# Patient Record
Sex: Male | Born: 1960 | Race: Black or African American | Hispanic: No | Marital: Single | Smoking: Former smoker
Health system: Southern US, Community
[De-identification: ages and names within clinical notes are randomized; demographics above are authoritative.]

## PROBLEM LIST (undated history)

## (undated) DIAGNOSIS — C3490 Malignant neoplasm of unspecified part of unspecified bronchus or lung: Secondary | ICD-10-CM

---

## 1997-10-29 ENCOUNTER — Encounter: Payer: Self-pay | Admitting: Emergency Medicine

## 1997-10-29 ENCOUNTER — Emergency Department (HOSPITAL_COMMUNITY): Admission: EM | Admit: 1997-10-29 | Discharge: 1997-10-29 | Payer: Self-pay | Admitting: Emergency Medicine

## 1997-11-28 ENCOUNTER — Emergency Department (HOSPITAL_COMMUNITY): Admission: EM | Admit: 1997-11-28 | Discharge: 1997-11-28 | Payer: Self-pay

## 2000-10-31 ENCOUNTER — Emergency Department (HOSPITAL_COMMUNITY): Admission: EM | Admit: 2000-10-31 | Discharge: 2000-10-31 | Payer: Self-pay | Admitting: Emergency Medicine

## 2004-11-16 ENCOUNTER — Emergency Department (HOSPITAL_COMMUNITY): Admission: EM | Admit: 2004-11-16 | Discharge: 2004-11-17 | Payer: Self-pay | Admitting: Emergency Medicine

## 2007-06-16 ENCOUNTER — Inpatient Hospital Stay (HOSPITAL_COMMUNITY): Admission: EM | Admit: 2007-06-16 | Discharge: 2007-06-20 | Payer: Self-pay | Admitting: Emergency Medicine

## 2007-06-19 ENCOUNTER — Ambulatory Visit: Payer: Self-pay | Admitting: Psychiatry

## 2007-06-20 ENCOUNTER — Inpatient Hospital Stay (HOSPITAL_COMMUNITY): Admission: AD | Admit: 2007-06-20 | Discharge: 2007-06-25 | Payer: Self-pay | Admitting: *Deleted

## 2007-06-20 ENCOUNTER — Ambulatory Visit: Payer: Self-pay | Admitting: *Deleted

## 2007-07-12 ENCOUNTER — Encounter: Admission: RE | Admit: 2007-07-12 | Discharge: 2007-07-12 | Payer: Self-pay | Admitting: Neurosurgery

## 2010-07-27 NOTE — Consult Note (Signed)
NAMEGRECO, GASTELUM NO.:  [id number]   MEDICAL RECORD NO.:  [id number]          PATIENT TYPE:  INP   LOCATION:  5122                         FACILITY:  [facility]   PHYSICIAN:  Anselm Jungling, MD  DATE OF BIRTH:  12-Sep-1960   DATE OF CONSULTATION:  06/19/2007  DATE OF DISCHARGE:                                 CONSULTATION   IDENTIFYING DATA AND REASON FOR REFERRAL:  The patient is a 50 year old  separated African American male, currently under the care of Dr. Leanor Rubenstein  here at [facility].  He was hospitalized for medical treatment  of injuries sustained in a hanging attempt, which was also a suicide  attempt.  Psychiatric consultation is requested to assess mental status  and make recommendations.   HISTORY OF THE PRESENTING PROBLEMS:  The patient, who appears to be a  fairly good historian, gives the following information.  He states that  he has no prior psychiatric history and no history of depression.  However, several months ago, due to changes in the economy, his work  hours were cut back.  The financial stresses led to loss of his home,  and at the same time he was going through marital problems and he and  his wife separated.  He was living in a motel.  He began drinking more  heavily.  He made a suicide attempt while intoxicated.  He admits that  he has a drinking problem.   At this time, the patient indicates that he is glad that he survived his  suicide attempt and is ready to go on living.  He indicates that he  agrees that he needs help and treatment for his depression and his  alcohol problem.   MENTAL STATUS AND OBSERVATIONS:  The patient is a well-nourished,  normally-developed adult male in his hospital bed, with a neck brace.  He is able to speak.  He is alert and fully oriented.  Thoughts and  speech are normally organized.  There is nothing to suggest any  underlying psychosis or thought disorder.  Mood is depressed with a very  sad affect.  He denies any active suicidal ideation as described above.   IMPRESSION:  AXIS I:  1.  Major depressive episode.  2.  Alcohol  dependence.  AXIS II:  Deferred.  AXIS III:  Status post hanging attempt, associated injuries.  AXIS IV:  Stressors severe.  AXIS V:  GAF 45.   RECOMMENDATIONS:  The patient agrees to transfer to inpatient psychiatry  to further address his depression and alcohol dependence.  Dr. Leanor Rubenstein  has indicated that the patient is probably medically cleared to go today  or tomorrow.  I will contact the assessment office and social work here  to bring about that transition.      Anselm Jungling, MD  Electronically Signed     SPB/MEDQ  D:  06/19/2007  T:  06/19/2007  Job:  [phone]

## 2010-07-27 NOTE — Discharge Summary (Signed)
NAMEFRANCESCO, [name] Chapman                  ACCOUNT NO.:  [id number]   MEDICAL RECORD NO.:  [id number]          PATIENT TYPE:  INP   LOCATION:  5122                         FACILITY:  [facility]   PHYSICIAN:  Gabrielle Dare. Janee Morn, M.D.DATE OF BIRTH:  1960-08-14   DATE OF ADMISSION:  06/16/2007  DATE OF DISCHARGE:  06/19/2007                               DISCHARGE SUMMARY   ADMITTING TRAUMA SURGEON:  Sandria Bales. Ezzard Standing, M.D.   CONSULTANTS:  1. Lyndal Pulley Chales Salmon, M.D., maxillofacial.  2. Donalee Citrin, M.D., neurosurgery.  3. Anselm Jungling, MD, psychiatry.   DISCHARGE DIAGNOSES:  1. Status post suicide attempt by hanging.  2. Right mandibular midbody nondisplaced fracture.  3. Comminuted left mandibular ramus fracture.  4. Displaced left mandibular subcondylar fracture.  5. Cervical neck strain secondary to attempted suicide by hanging.  6. History of alcohol abuse and tobacco abuse.   PROCEDURES:  None.   This is a 50 year old black male who reportedly attempted to hang  himself with a sheet from the second story of the building.  Apparently  the sheet gave way, and the patient fell.  He was brought into [facility] ED as a silver trauma code activation.  He was identified to have a  fractured mandible. He was alert, oriented and appropriate on his  admission.  Workup at this time including a CT scan of his head was  negative.  CT scan of the face showed a right mandible body fracture,  left ramus fracture which was comminuted, and left subcondylar mandible  fracture.  CT scan of the C-spine showed no acute neck injury, although  there was question of cervical ligamentous instability.  CT scan of his  chest was negative.  CT scan of abdomen and pelvis showed no evidence  for intra-abdominal injury.   HOSPITAL COURSE:  The patient was admitted.  He was left in his cervical  collar for questionable C-spine injury.  He was evaluated by Dr. Chales Salmon  for his mandible fracture. Given the patients  multiple missing teeth, no  open fractures, and the relatively minimal displacement of his  fractures, it was felt that no surgical intervention was recommended at  this time.  It was felt that the fractures with take approximately 4-6  weeks to heal and that a strict non-chew diet was going to be necessary  to low this to occur.  He will need to follow up with Dr. Chales Salmon in  approximately 4 weeks in his office to reevaluate his fractures.   To further evaluate the patient's C-spine ligamentous injury, the  patient did undergo MRI scanning of cervical spine to further evaluate  his spine for ligamentous injury.  No clear-cut ligamentous injury was  seen; however, it was felt that he did have some prevertebral and  retropharyngeal edema which was related to his hanging episode.  As he  does have some discomfort, he will be maintained in a cervical collar  and can follow up with Dr. Wynetta Emery for this after discharge.   The patient was also being evaluated by Dr. Electa Sniff for his suicide  attempt and depression.  It was felt that he is undergoing a major  depressive episode and has significant alcohol dependence.  The patient  was agreeable to transfer to inpatient psychiatry to further address and  treat his alcohol dependence and depression.  It is felt that he is  medically clear at this time for this to occur.   DISCHARGE MEDICATIONS:  1. Keflex 500 mg elixir 4 times a day x10 more days.  2. He is on the Librium EtOH withdrawal protocol, currently at 25 mg      q.12 h today, then at 25 mg q.24 h tomorrow.  3. He is on Peridex mouth wash 10 mL p.o. 4 times a day x10 more days.  4. Multivitamin one daily.  5. Thiamine 100 mg p.o. daily.  6. Lortab elixir 5-10 mL p.o. q.4 h p.r.n. pain,   ACTIVITY:  To tolerance.   DIET:  Soft, non-chew foods only secondary to his mandible fracture.   FOLLOWUP:  With Dr. Wynetta Emery and Dr. Chales Salmon in approximately 1 month.   Again, at this time it is felt  that the patient should discharged to  inpatient psychiatry, and arrangements are being made for this to done.      Shawn Rayburn, P.A.      Gabrielle Dare Janee Morn, M.D.  Electronically Signed    SR/MEDQ  D:  06/19/2007  T:  06/19/2007  Job:  [id number]   cc:   Lyndal Pulley Chales Salmon, M.D.  Donalee Citrin, M.D.  Anselm Jungling, MD  [facility]

## 2010-07-27 NOTE — H&P (Signed)
NAMEQUIRINO, KAKOS                  ACCOUNT NO.:  [id number]   MEDICAL RECORD NO.:  [id number]          PATIENT TYPE:  EMS   LOCATION:  MAJO                         FACILITY:  [facility]   PHYSICIAN:  Sandria Bales. Ezzard Standing, M.D.  DATE OF BIRTH:  Aug 28, 1960   DATE OF ADMISSION:  06/16/2007  DATE OF DISCHARGE:                              HISTORY & PHYSICAL   Date of history here ??   HISTORY OF PRESENT ILLNESS:  This is a 50 year old black gentleman who  has no identifying medical doctor, and he has a strong history of  alcohol abuse.  Apparently he tried to hang himself tonight with a sheet  from a second floor, the sheet gave way, and the patient fell.  He came  into the [facility] ER stable as a silver trauma.  He has been identified to  have a fractured mandible, but no other significant injuries.   He admits to drinking alcohol and having alcoholic problems.  His  separated wife is in the room and also speaks of his alcohol abuse and  his cigarettes smoking abuse.   He responds, he is appropriate, he smells of alcohol but can give a  suprisingly lucid history of what happened and his problems.   PAST MEDICAL HISTORY:   ALLERGIES:  None.   MEDICATIONS:  None.   REVIEW OF SYMPTOMS:  NEUROLOGICAL:  No seizures or loss of  consciousness.  PULMONARY:  Smokes cigarettes, knows it is bad for his health.  No  history of chronic lung disease.  CARDIAC:  Denies heart disease or chest pain.  GASTROINTESTINAL:  He has been told he has gastroesophageal reflux  disease, took a pill one time, he is better now.  He has no history of  liver disease, pancreatic disease, colon disease.  UROLOGIC:  No history of kidney stones or kidney infections.   Again, his estranged wife is here with him.  I am not sure if they live  together or not.  He works in [employer], [employer] for [employer].  He has no primary medical doctor.   PHYSICAL EXAMINATION:  VITAL SIGNS:  Pulse 93,  respirations 16, blood  pressure 155/94.  GENERAL:  He is an older black gentleman, alert, cooperative, again,  smells heavily of alcohol, but amazingly mentally alert for the blood  alcohol level that he has.  HEENT:  Pupils equal and reactive to light with extraocular movements  good x 6.  Pupils react.  He is absent most of his teeth.  He has an  abrasion laceration at the edge of his right mandible.  He is in a  cervical collar.  His TMs are unremarkable.  Again, he is missing a lot  of teeth, whether these are acute injury, but he has a lot of chronic  disease of his teeth already.  LUNGS:  Clear to auscultation.  HEART:  Regular rate and rhythm without murmur or rub.  ABDOMEN:  Soft and scaphoid without scars.  EXTREMITIES:  No obvious long bone injury.  He moves his upper lower  extremities.  NEUROLOGICAL:  Grossly intact to motor and sensory function.  When I  rolled him on his back, he has no pain in the neck from the cervical  collar just because of his alcohol level.   LABORATORY DATA:  Sodium 137, potassium 3.9, chloride 100, BUN 5,  creatinine 0.9.  hemoglobin 17, hematocrit 50.  Blood alcohol 365.   I reviewed his CT scans with Dr. Augusto Gamble.  CT of his head was negative.  CT of his face shows a right mandible body fracture which is comminuted.  A right ramus fracture and a left ramus fracture which is impacted.  CT of his neck shows no acute injury, though, Dr. Margo Aye did raise a  question if just anterior cervix could be a little bit thicker.  I am  leaving the collar overnight to see if he has pain in the morning.  CT of his chest was negative.  CT of his abdomen showed no obvious acute intraabdominal injury.   I have made his C-spine not clear because of alcohol level and I left  him in the collar overnight.   IMPRESSION:  1. Suicide attempt.  Will get Psych Act Team involved.  2. Fractured mandible times three, to be seen by Dr. Chales Salmon who is on      call for  maxillofacial.  3. Alcohol abuse which probably will not get resolved in this      admission.  Will cover with thiamine and vitamins and start      withdrawal protocol if necessary.  4. Smokes cigarettes.  5. Gastroesophageal reflux disease.      Sandria Bales. Ezzard Standing, M.D.  Electronically Signed     DHN/MEDQ  D:  06/16/2007  T:  06/17/2007  Job:  [id number]   cc:   Lyndal Pulley Chales Salmon, M.D.

## 2010-07-27 NOTE — H&P (Signed)
[name] Chapman, IRION                  ACCOUNT NO.:  [id number]   MEDICAL RECORD NO.:  [id number]          PATIENT TYPE:  INP   LOCATION:  5122                         FACILITY:  [facility]   PHYSICIAN:  Lyndal Pulley. Chales Salmon, M.D.   DATE OF BIRTH:  [birth year]   DATE OF ADMISSION:  06/16/2007  DATE OF DISCHARGE:                              HISTORY & PHYSICAL   Asked to evaluate this 50 year old black male by the trauma service who  reportedly attempted suicide earlier this evening.  The patient  attempted to hang himself with a sheet, the sheet gave way and the  patient fell on his face, reportedly sustaining mandibular fractures.  The emergency department physician and the trauma surgeon cleared him of  other injuries, other than a pending C-spine study.   EXAM:  The patient had obvious left facial swelling with his mandible  deviated slightly to the left.  He had limited mandibular opening;  however, was guarding secondary to pain.  He had no obvious step  deformities along the inferior border of his mandible and his mandible  was stable.  His mid facial exam was negative.   INTRAORAL EXAM:  The patient had multiple missing teeth with his  remaining teeth all mobile secondary to severe periodontal disease.  There was moderate floor-of-mouth swelling and ecchymosis, primarily on  the right side.  His posterior pharynx was clear as well as his airway.  There were no obvious open fractures present intraorally with his  overlying mucosal tissue intact.  His occlusion was impossible to  determine secondary to the multiple missing teeth.  His TMs were clear  bilaterally and there was no Battle's sign.   RADIOGRAPHIC EXAM:  Maxillofacial CT scan revealed a right mandibular  mid body nondisplaced fracture.  It also demonstrated a comminuted left  mandibular ramus fracture as well as a displaced left mandibular  subcondylar fracture.  No mid facial or maxillary fractures were seen.   ASSESSMENT AND  PLAN:  In light of the patient's multiple missing teeth  and no open fractures, no surgical intervention is recommended.  The  right mandibular body fracture was also nondisplaced and stable.   The findings and recommended treatment were discussed both with the  patient and his wife, who was present.  They were told that the  fractures would require approximately 4-6 weeks of healing and that a  strict non-chew diet was necessary to allow the fractures to heal.  He  was placed on Ancef 1 gram IV q.8h. as an inpatient and will be given  Keflex 500 mg to be taken four times a day for 10 full days after he is  discharged.  The trauma service that he is admitted to will take care of  other medications and follow-up.  He and his wife were also told that a  follow-up appointment will be made at my office in approximately 4  weeks.      Lyndal Pulley Chales Salmon, M.D.  Electronically Signed     TGO/MEDQ  D:  06/17/2007  T:  06/17/2007  Job:  [id number]

## 2010-07-27 NOTE — H&P (Signed)
NAMEMARLIN, [name] Chapman NO.:  [id number]   MEDICAL RECORD NO.:  [id number]          PATIENT TYPE:  IPS   LOCATION:  0303                          FACILITY:  [facility]   PHYSICIAN:  Jasmine Pang, M.D. DATE OF BIRTH:  1961/01/25   DATE OF ADMISSION:  06/20/2007  DATE OF DISCHARGE:                       PSYCHIATRIC ADMISSION ASSESSMENT   HISTORY OF PRESENT ILLNESS:  Voluntarily admitted on June 20, 2007.  The  patient presents with a history of a suicide attempt.  States the  patient had tried to hang himself with the bed sheets from a second  story building.  The patient states that he did it as a joke.  He was  in a motel at that time and his wife was present.  He states the next  thing he knew that he was lying on cement and then taken to the  emergency department.  The patient reports using alcohol pretty much on  a daily basis, experiencing stress, feeling low, being by himself.  He  lost his home, having problems with his work and is currently separated  from his wife.  He states that his family does not like his wife.  He  drinks when he has money and has been doing some occasional cocaine use.   PAST PSYCHIATRIC HISTORY:  First admission to [facility].  He was in a rehab program at [facility] about 5-6 years ago.  No current  outpatient mental health treatment.   SOCIAL HISTORY:  He is a married male, second marriage, married for 20  years, has 2 children, 7 grandchildren and works in the moving business.  Denies any legal problems.   FAMILY HISTORY:  Is none.   ALCOHOL AND DRUG HISTORY:  The patient smokes cigarettes, has been  drinking daily without any seizure activities, did have withdrawal  symptoms when he has tried to stop drinking on his own and uses rock  cocaine.   PRIMARY CARE Atleigh Gruen:  Is none.   MEDICAL PROBLEMS:  The patient currently has a right mandibular  nondisplaced fracture, has a displaced left mandibular fracture and a  cervical neck strain.   MEDICATIONS:  1. The patient was discharged on Keflex 500 mg elixir 4 times a day      for 10 days.  2. Librium 25 mg one today to finish up his alcohol protocol.  3. Peridex mouth wash 10 mL q.i.d. for 10 more days.  4. Multivitamin daily.  5. Thiamine 100 mg daily.  6. Lortab elixir 5-10 mL every 4 hours as needed.   DRUG ALLERGIES:  No known allergies.   PHYSICAL EXAM:  GENERAL:  This is a middle-aged male again transferred  from [facility] after a 3 day period of being assessed for  injuries and stabilization.  He appears in no acute distress.  He is  currently wearing a cervical collar.  VITAL SIGNS:  His vital signs 97.5, 96 heart rate, 18 respirations.  His  blood pressure is 137/90, 152 pounds, 5 feet 9 inches tall.  He does not  have any tremors noted.  LABORATORY DATA:  Hemoglobin is 12.2, hematocrit 35.5.  Urine drug  screen is negative.  Alcohol level on admission to the emergency room  was 365, glucose of 101, BUN is 5.   MENTAL STATUS EXAM:  This is a fully alert, cooperative male.  He has  good eye contact.  He is currently wearing a hospital gown.  He has a C  collar in place.  Speech is soft-spoken, normal pace.  The patient today  feels stupid for what he did.  He appears sad.  He is very agreeable  to treatment plan.  Provides a good story and history.  Thought  processes are coherent.  No evidence of any psychotic symptoms.  Denies  any suicidal thoughts.  Cognitive function intact.  His memory is good.  Judgment and insight is fair.  Poor impulse control.   AXIS I:  Major depressive disorder, severe, alcohol dependence.  AXIS II:  Deferred.  AXIS III:  Right mandibular nondisplaced fracture and a displaced left  mandibular fracture with cervical neck strain.  AXIS IV:  Severe with primary support group, occupation, housing,  economic issues.  Medical problems and psychosocial problems.  AXIS V:  Current is 35.   PLAN:   Contract for safety.  Stabilize mood and thinking.  Will continue  with Librium protocol, monitor withdrawal symptoms.  The patient will  maintain a cervical collar as advised.  Will offer fluids.  Case manager  is to assess his followup plan and will have a family session with his  wife.  The patient knows he needs to abstain from alcohol and to get his  life back together.  He also may benefit from some therapy and an  antidepressant.  We will continue to assess co-morbidities.  The patient  is to follow up with Dr. Chales Salmon and Dr. Purnell Shoemaker in 4 weeks.  His tentative  length of stay is 4-6 days.      Landry Corporal, N.P.      Jasmine Pang, M.D.  Electronically Signed    JO/MEDQ  D:  06/21/2007  T:  06/21/2007  Job:  [id number]   cc:   Lianne Bushy, M.D.  Fax: [phone]

## 2010-07-30 NOTE — Discharge Summary (Signed)
[name], [name] NO.:  [id number]   MEDICAL RECORD NO.:  [id number]          PATIENT TYPE:  IPS   LOCATION:  0303                          FACILITY:  [facility]   PHYSICIAN:  Jasmine Pang, M.D. DATE OF BIRTH:  Sep 16, 1960   DATE OF ADMISSION:  06/20/2007  DATE OF DISCHARGE:  06/25/2007                               DISCHARGE SUMMARY   IDENTIFICATION:  This is a involuntary admission for this 50 year old  married male who was admitted on June 20, 2007.   HISTORY OF PRESENT ILLNESS:  The patient presents with a history of  suicide attempt.  The patient apparently tried to hang himself with bed  sheet from a second storey building.  He states he did this as a joke.  He was in a motel at time and his wife was present.  He states the next  thing he knew he was lying on cement and taken to the emergency  department.  The patient reports using alcohol pretty much on a daily  basis experiencing stress feeling low being by himself.  He lost his  home having problems with his work and is currently separated from his  wife.  He states his family does not like his wife.  He drinks when he  has money and has been doing occasional cocaine.   PAST PSYCHIATRIC HISTORY:  This is the first admission to [facility].  The patient was in a rehab program at [facility], 5-6  years ago.  He has no current outpatient treatment.   FAMILY HISTORY:  None.   ALCOHOL AND DRUG HISTORY:  The patient smokes cigarettes.  He has been  drinking daily without any seizure activities.  He did have withdrawal  symptoms when he tried to stop drinking on his own.  He also uses rock  cocaine.   MEDICAL PROBLEMS:  The patient currently has a right mandibular  nondisplaced fracture.  He has a displaced left mandibular fracture and  a cervical neck strain.   MEDICATIONS:  The patient was discharged on Keflex 400 mg four times a  day for 10 days and Librium 25 mg one today to finish his  detox  protocol.  He was also prescribed Peridex mouth wash, multivitamins,  thiamine 100 mg, and Lortab elixir 5-10 mL every 4 hours as needed for  pain in his jaw.   DRUG ALLERGIES:  No known drug allergies.   PHYSICAL FINDINGS:  This is a middle-aged man who was transferred from  [facility] after a 3-day period of being assessed for injuries  and stabilizations.  He was in no acute distress.  See the physical exam  done on this unit as well documented in the chart.   LABORATORY DATA:  Hemoglobin was 12.2, hematocrit 35.5.  Urine drug  screen was negative.  Alcohol level on admission to the emergency room  was 365.  Glucose was 101.  BUN was 5.   HOSPITAL COURSE:  Upon admission, the patient was started on Ambien 10  mg p.o. q.h.s. p.r.n. insomnia and Motrin 400 mg  p.o. q.4 h. p.r.n.  pain.  He was also continued on Keflex elixir 500 mg q.i.d. for 10 days  and Librium and Peridex mouth wash 10 cc q.i.d. for 10 more days.  He  was started on Lortab elixir 5-10 cc q.4 h. p.r.n. pain.  On June 21, 2007, he was started on Librium 25 mg p.o. q.6 h. p.r.n. symptoms of  withdrawal or anxiety for 48 hours.  On June 23, 2007, he was also  started on Celexa 10 mg in the morning and at h.s. for depression and  anxiety.  In individual sessions with me, the patient was reserved, but  cooperative.  He had to wear a neck brace, which interfered with his  range of motion of his head.  He was in some pain from the jaw fracture.  He was able to participate appropriately in unit therapeutic groups and  activities.  He stated he was here due to alcohol and depression my  wife is given me on hard time.  He lost his house and finances are a  problem.  This past weekend he began to drink, he used all his money,  tried to hang himself.  He states this was done as a joke, but he  accidentally passed out.  He reports drinking a case of beer at night.  He denies any other drug abuse.  As  hospitalization progressed, mental  status improved.  His wife visited and he stated this went well.  He was  on intervals, was starting on the Celexa as indicated above.  On June 25, 2007, the patient had continued to make very good progress.  His  sleep was good.  Appetite was good.  Mood was less depressed, less  anxious.  Affect consistent with mood.  There was no suicidal or  homicidal ideation.  No auditory or visual hallucinations.  No paranoia  or delusions.  Thoughts were logical and goal-directed.  Thought  content, no predominant theme.  Cognitive was grossly back to baseline.  The patient wanted discharge and planned to go live with his sister.  There was going to be a family session with his wife and he was then be  discharged.  His wife did not feel he was a risk to harm himself and the  patient denied suicidal ideation.  He stated he was going to [employer]  again and his wife agreed they will only talk once a week for now, so  that he can focus on himself.  He will go to live with his sister.  It  was felt the patient was safe for discharge.   DISCHARGED DIAGNOSES:  Axis I:  Depressive disorder, not otherwise  specified, alcohol dependence.  Axis II:  None.  Axis III:  Right mandibular nondisplaced fracture and a displaced left  mandibular fracture with cervical neck strain.  Axis IV:  Severe (problems with primary support group, occupation,  housing, economic issues, medical problems and psychosocial problems).  Axis V:  Global assessment of functioning was 50 upon discharge.  GAF  was 35 upon admission.  GAF highest past year was 60-65.   DISCHARGE PLANS:  There was no specific activity level or dietary  restrictions.   POST HOSPITAL CARE PLANS:  The patient will see Dr. Joni Reining at the  [facility] on July 02, 2007, at 10:30 a.m.  He is to follow up  with his oral surgeon, Dr. Remonia Richter in 4 weeks and follow up with his  orthopedist, Dr. Cheree Ditto, on Thursday  at April 30th at 11:30 a.m.   DISCHARGE MEDICATIONS:  1. Celexa 10 mg in the morning and at bedtime.  2. Keflex 500 mg q.i.d. to take until June 30, 2007.  3. Peridex 10 cc, patient is to use as directed.      Jasmine Pang, M.D.  Electronically Signed     B[facility]/MEDQ  D:  07/26/2007  T:  07/26/2007  Job:  [id number]

## 2010-12-07 LAB — POCT I-STAT, CHEM 8
BUN: 5 — ABNORMAL LOW
Calcium, Ion: 1.03 — ABNORMAL LOW
Creatinine, Ser: 0.9
Hemoglobin: 17
Sodium: 137
TCO2: 23

## 2010-12-07 LAB — POCT CARDIAC MARKERS
CKMB, poc: 1.3
Myoglobin, poc: 89.1
Troponin i, poc: <0.05

## 2010-12-07 LAB — RAPID URINE DRUG SCREEN, HOSP PERFORMED
Amphetamines: NOT DETECTED
Barbiturates: NOT DETECTED
Opiates: NOT DETECTED
Tetrahydrocannabinol: NOT DETECTED

## 2010-12-07 LAB — ETHANOL: Alcohol, Ethyl (B): 365 — ABNORMAL HIGH

## 2010-12-07 LAB — CBC
MCHC: 34.3
Platelets: 280
RDW: 13.1

## 2011-01-17 ENCOUNTER — Emergency Department (HOSPITAL_COMMUNITY)
Admission: EM | Admit: 2011-01-17 | Discharge: 2011-01-17 | Discharge status: Absent without leave | Payer: Self-pay | Source: Home / Self Care

## 2011-01-18 ENCOUNTER — Emergency Department (HOSPITAL_COMMUNITY)
Admission: EM | Admit: 2011-01-18 | Discharge: 2011-01-18 | Disposition: A | Discharge status: Home / Self Care | Payer: Self-pay | Source: Home / Self Care | Attending: Family Medicine | Admitting: Family Medicine

## 2011-01-18 ENCOUNTER — Encounter: Payer: Self-pay | Admitting: *Deleted

## 2011-01-18 DIAGNOSIS — Z9189 Other specified personal risk factors, not elsewhere classified: Secondary | ICD-10-CM

## 2011-01-18 DIAGNOSIS — Z202 Contact with and (suspected) exposure to infections with a predominantly sexual mode of transmission: Secondary | ICD-10-CM

## 2011-01-18 NOTE — ED Notes (Signed)
NO scripts given. Discharge instructions reviewed with pt per Earma Reading RN

## 2011-01-18 NOTE — ED Provider Notes (Signed)
History     CSN: [id number] Arrival date & time: 01/18/2011 12:20 PM   First MD Initiated Contact with Patient 01/18/11 1326      Chief Complaint  Patient presents with  . Exposure to STD    (Consider location/radiation/quality/duration/timing/severity/associated sxs/prior treatment) Patient is a 49 y.o. male presenting with STD exposure. The history is provided by the patient.  Exposure to STD This is a new (pt with no sx, says wife wants him to get checked.) problem. The problem has not changed since onset.   History reviewed. No pertinent past medical history.  History reviewed. No pertinent past surgical history.  History reviewed. No pertinent family history.  History  Substance Use Topics  . Smoking status: Current Everyday Smoker -- 1.0 packs/day  . Smokeless tobacco: Not on file  . Alcohol Use: 3.6 oz/week    6 Cans of beer per week     drinks daily      Review of Systems  Constitutional: Negative.   Genitourinary: Negative.     Allergies  Review of patient's allergies indicates no known allergies.  Home Medications   Current Outpatient Rx  Name Route Sig Dispense Refill  . CITALOPRAM HYDROBROMIDE 40 MG PO TABS Oral Take 40 mg by mouth daily.        BP 131/75  Pulse 89  Temp(Src) 98 F (36.7 C) (Oral)  Resp 16  SpO2 100%  Physical Exam  Constitutional: He appears well-developed and well-nourished.  Genitourinary: Testes normal and penis normal. Circumcised. No discharge found.  Lymphadenopathy:       Right: No inguinal adenopathy present.       Left: No inguinal adenopathy present.    ED Course  Procedures (including critical care time)  Labs Reviewed - No data to display No results found.   No diagnosis found.    MDM          Barkley Bruns, MD 01/18/11 [phone]

## 2011-01-18 NOTE — ED Notes (Signed)
Pt reports he is here for a physical. His wife told him to get check-out because she thinks he has "something". Pt denies penile discharge or itching. Denies fever. Note entered by Evans Lance

## 2011-01-19 LAB — GC/CHLAMYDIA PROBE AMP, GENITAL
Chlamydia, DNA Probe: NEGATIVE
GC Probe Amp, Genital: NEGATIVE

## 2011-01-25 ENCOUNTER — Telehealth (HOSPITAL_COMMUNITY): Payer: Self-pay | Admitting: *Deleted

## 2013-02-18 ENCOUNTER — Ambulatory Visit: Payer: Self-pay | Admitting: Internal Medicine

## 2015-01-24 ENCOUNTER — Encounter (HOSPITAL_COMMUNITY): Payer: Self-pay | Admitting: Nurse Practitioner

## 2015-01-24 ENCOUNTER — Emergency Department (HOSPITAL_COMMUNITY)
Admission: EM | Admit: 2015-01-24 | Discharge: 2015-01-24 | Disposition: A | Discharge status: Home / Self Care | Payer: 59 | Attending: Emergency Medicine | Admitting: Emergency Medicine

## 2015-01-24 DIAGNOSIS — R0789 Other chest pain: Secondary | ICD-10-CM

## 2015-01-24 DIAGNOSIS — L02213 Cutaneous abscess of chest wall: Secondary | ICD-10-CM | POA: Diagnosis not present

## 2015-01-24 DIAGNOSIS — Z79899 Other long term (current) drug therapy: Secondary | ICD-10-CM | POA: Diagnosis not present

## 2015-01-24 DIAGNOSIS — R079 Chest pain, unspecified: Secondary | ICD-10-CM | POA: Diagnosis present

## 2015-01-24 DIAGNOSIS — F17200 Nicotine dependence, unspecified, uncomplicated: Secondary | ICD-10-CM | POA: Insufficient documentation

## 2015-01-24 DIAGNOSIS — L0291 Cutaneous abscess, unspecified: Secondary | ICD-10-CM

## 2015-01-24 LAB — CBC
HCT: 43.7 % (ref 39.0–52.0)
HEMOGLOBIN: 15.3 g/dL (ref 13.0–17.0)
MCH: 30.5 pg (ref 26.0–34.0)
MCHC: 35 g/dL (ref 30.0–36.0)
MCV: 87.2 fL (ref 78.0–100.0)
PLATELETS: 271 10*3/uL (ref 150–400)
RBC: 5.01 MIL/uL (ref 4.22–5.81)
RDW: 14.8 % (ref 11.5–15.5)
WBC: 11.1 10*3/uL — ABNORMAL HIGH (ref 4.0–10.5)

## 2015-01-24 LAB — BASIC METABOLIC PANEL
Anion gap: 9 (ref 5–15)
BUN: 5 mg/dL — ABNORMAL LOW (ref 6–20)
CALCIUM: 8.9 mg/dL (ref 8.9–10.3)
CO2: 22 mmol/L (ref 22–32)
CREATININE: 0.77 mg/dL (ref 0.61–1.24)
Chloride: 103 mmol/L (ref 101–111)
GFR calc Af Amer: >60 mL/min (ref >60)
GFR calc non Af Amer: >60 mL/min (ref >60)
GLUCOSE: 71 mg/dL (ref 65–99)
Potassium: 3.6 mmol/L (ref 3.5–5.1)
Sodium: 134 mmol/L — ABNORMAL LOW (ref 135–145)

## 2015-01-24 LAB — I-STAT TROPONIN, ED: TROPONIN I, POC: 0 ng/mL (ref 0.00–0.08)

## 2015-01-24 MED ORDER — OXYCODONE-ACETAMINOPHEN 5-325 MG PO TABS
1.0000 | ORAL_TABLET | Freq: Once | ORAL | Status: AC
  Administered 2015-01-24: 1 via ORAL
  Filled 2015-01-24: qty 1

## 2015-01-24 MED ORDER — LIDOCAINE HCL 2 % IJ SOLN
10.0000 mL | Freq: Once | INTRAMUSCULAR | Status: AC
  Administered 2015-01-24: 10 mg via INTRADERMAL
  Filled 2015-01-24: qty 20

## 2015-01-24 NOTE — ED Provider Notes (Signed)
CSN: [id number]     Arrival date & time 01/24/15  1750 History   First MD Initiated Contact with Patient 01/24/15 2005     Chief Complaint  Patient presents with  . Chest Pain  . Wound Infection   HPI  [name] Chapman is a 54 year old male presenting with an abscess. Patient reports that he has had a "bump" on his left shoulder blade for the past 3 weeks that has been growing in size. He denies drainage from the abscess. He states that the abscess is now extremely tender to touch and he cannot lay on his back due to the pain. He states that the pain from the abscess radiates into the left side of his ribs and down to his buttocks. He has not attempted any pain relief for the abscess. He denies using warm compresses or hot soaks to attempt to treat at home. Denies a history of frequent abscesses. He denies fevers, chills, nausea or vomiting.  History reviewed. No pertinent past medical history. History reviewed. No pertinent past surgical history. History reviewed. No pertinent family history. Social History  Substance Use Topics  . Smoking status: Current Every Day Smoker -- 1.00 packs/day  . Smokeless tobacco: None  . Alcohol Use: 3.6 oz/week    6 Cans of beer per week     Comment: drinks daily    Review of Systems  Constitutional: Negative for fever, chills and diaphoresis.  Respiratory: Negative for cough, chest tightness and shortness of breath.   Cardiovascular: Positive for chest pain. Negative for palpitations.  Gastrointestinal: Negative for nausea, vomiting, abdominal pain and diarrhea.  Genitourinary: Testicular pain: chest wall pain from abscess.  Musculoskeletal: Positive for myalgias and back pain. Negative for gait problem and neck pain.  Skin: Positive for wound (abscess).  Neurological: Negative for dizziness, syncope, weakness and headaches.  All other systems reviewed and are negative.     Allergies  Review of patient's allergies indicates no known allergies.  Home  Medications   Prior to Admission medications   Medication Sig Start Date End Date Taking? Authorizing Provider  Aspirin-Salicylamide-Caffeine (BC HEADACHE POWDER PO) Take 1 Package by mouth daily.   Yes Historical Provider, MD   BP 143/95 mmHg  Pulse 83  Temp(Src) 97.6 F (36.4 C) (Oral)  Resp 18  SpO2 97% Physical Exam  Constitutional: He appears well-developed and well-nourished. No distress.  HENT:  Head: Normocephalic and atraumatic.  Eyes: Conjunctivae are normal. Right eye exhibits no discharge. Left eye exhibits no discharge. No scleral icterus.  Neck: Normal range of motion.  Cardiovascular: Normal rate, regular rhythm and normal heart sounds.   Pulmonary/Chest: Effort normal and breath sounds normal. No respiratory distress. He has no wheezes. He has no rales.    Abdominal: Soft. He exhibits no distension. There is no tenderness.  Musculoskeletal: Normal range of motion.  Moves all extremities spontaneously and without pain. Walks with a steady gait  Neurological: He is alert. Coordination normal.  5 out of 5 strength in all major muscle groups.  Skin: Skin is warm and dry.  Large, 3 cm abscess noted to the left scapula. Central fluctuance without surrounding induration. Slight erythema over abscess but not extending to the skin surrounding. Extremely painful to touch.  Psychiatric: He has a normal mood and affect. His behavior is normal.  Nursing note and vitals reviewed.   ED Course  Procedures (including critical care time)  INCISION AND DRAINAGE Performed by: Eston Esters Consent: Verbal consent obtained.  Risks and benefits: risks, benefits and alternatives were discussed Type: abscess  Body area: left posterior, superior trunk  Anesthesia: local infiltration  Incision was made with a scalpel.  Local anesthetic: lidocaine 2% without epinephrine  Anesthetic total: 6 ml  Complexity: complex Blunt dissection to break up loculations  Drainage:  purulent  Drainage amount: moderate  Patient tolerance: Patient tolerated the procedure well with no immediate complications.    Labs Review Labs Reviewed  BASIC METABOLIC PANEL - Abnormal; Notable for the following:    Sodium 134 (*)    BUN 5 (*)    All other components within normal limits  CBC - Abnormal; Notable for the following:    WBC 11.1 (*)    All other components within normal limits  I-STAT TROPOININ, ED    Imaging Review No results found. I have personally reviewed and evaluated these images and lab results as part of my medical decision-making.   EKG Interpretation None      MDM   Final diagnoses:  Abscess  Chest wall pain   Patient presenting with skin abscess of the left scapula amenable to incision and drainage.  Patient tolerated the procedure well. No packing or drain inserted. Patient reported that the pain from his abscesses was so severe that it radiated into his left lateral chest and down to his buttocks. Upon drainage of the abscess and 1 tablet of Vicodin, he reports full resolution of his pain. Antibiotic therapy is not indicated. Encouraged warm soaks at home and keeping the wound clean and dry. Instructed to go to PCP or urgent care in 2 days for wound recheck. Patient expresses understanding and is stable for discharge. Return precautions discussed with patient and given in discharge paperwork.    Josephina Gip, PA-C 01/25/15 7846  Pattricia Boss, MD 01/25/15 1620

## 2015-01-24 NOTE — ED Notes (Signed)
He has an abscess on L shoulder blade with pain radiating into his chest and down both his legs. Denies n/v/fevers. He has some intermittent sob but thinks its because his abscess hurts so bad. hes A&Ox4, breathing easily

## 2015-01-24 NOTE — Discharge Instructions (Signed)
- Keep wound dry, clean and covered. - Use over-the-counter pain relievers such as Tylenol or Motrin. - Go to your primary care doctor, urgent care or emergency Department in 2 days for a wound check. - Return to emergency department with fevers, chills, nausea, vomiting or any other new or concerning symptoms   Abscess An abscess is an infected area that contains a collection of pus and debris.It can occur in almost any part of the body. An abscess is also known as a furuncle or boil. CAUSES  An abscess occurs when tissue gets infected. This can occur from blockage of oil or sweat glands, infection of hair follicles, or a minor injury to the skin. As the body tries to fight the infection, pus collects in the area and creates pressure under the skin. This pressure causes pain. People with weakened immune systems have difficulty fighting infections and get certain abscesses more often.  SYMPTOMS Usually an abscess develops on the skin and becomes a painful mass that is red, warm, and tender. If the abscess forms under the skin, you may feel a moveable soft area under the skin. Some abscesses break open (rupture) on their own, but most will continue to get worse without care. The infection can spread deeper into the body and eventually into the bloodstream, causing you to feel ill.  DIAGNOSIS  Your caregiver will take your medical history and perform a physical exam. A sample of fluid may also be taken from the abscess to determine what is causing your infection. TREATMENT  Your caregiver may prescribe antibiotic medicines to fight the infection. However, taking antibiotics alone usually does not cure an abscess. Your caregiver may need to make a small cut (incision) in the abscess to drain the pus. In some cases, gauze is packed into the abscess to reduce pain and to continue draining the area. HOME CARE INSTRUCTIONS   Only take over-the-counter or prescription medicines for pain, discomfort, or  fever as directed by your caregiver.  If you were prescribed antibiotics, take them as directed. Finish them even if you start to feel better.  If gauze is used, follow your caregiver's directions for changing the gauze.  To avoid spreading the infection:  Keep your draining abscess covered with a bandage.  Wash your hands well.  Do not share personal care items, towels, or whirlpools with others.  Avoid skin contact with others.  Keep your skin and clothes clean around the abscess.  Keep all follow-up appointments as directed by your caregiver. SEEK MEDICAL CARE IF:   You have increased pain, swelling, redness, fluid drainage, or bleeding.  You have muscle aches, chills, or a general ill feeling.  You have a fever. MAKE SURE YOU:   Understand these instructions.  Will watch your condition.  Will get help right away if you are not doing well or get worse.   This information is not intended to replace advice given to you by your health care provider. Make sure you discuss any questions you have with your health care provider.   Document Released: 12/08/2004 Document Revised: 08/30/2011 Document Reviewed: 05/13/2011 Elsevier Interactive Patient Education 2016 [place].  Chest Pain Observation It is often hard to give a specific diagnosis for the cause of chest pain. Among other possibilities your symptoms might be caused by inadequate oxygen delivery to your heart (angina). Angina that is not treated or evaluated can lead to a heart attack (myocardial infarction) or death. Blood tests, electrocardiograms, and X-rays may have been  done to help determine a possible cause of your chest pain. After evaluation and observation, your health care provider has determined that it is unlikely your pain was caused by an unstable condition that requires hospitalization. However, a full evaluation of your pain may need to be completed, with additional diagnostic testing as directed. It  is very important to keep your follow-up appointments. Not keeping your follow-up appointments could result in permanent heart damage, disability, or death. If there is any problem keeping your follow-up appointments, you must call your health care provider. HOME CARE INSTRUCTIONS  Due to the slight chance that your pain could be angina, it is important to follow your health care provider's treatment plan and also maintain a healthy lifestyle:  Maintain or work toward achieving a healthy weight.  Stay physically active and exercise regularly.  Decrease your salt intake.  Eat a balanced, healthy diet. Talk to a dietitian to learn about heart-healthy foods.  Increase your fiber intake by including whole grains, vegetables, fruits, and nuts in your diet.  Avoid situations that cause stress, anger, or depression.  Take medicines as advised by your health care provider. Report any side effects to your health care provider. Do not stop medicines or adjust the dosages on your own.  Quit smoking. Do not use nicotine patches or gum until you check with your health care provider.  Keep your blood pressure, blood sugar, and cholesterol levels within normal limits.  Limit alcohol intake to no more than 1 drink per day for women who are not pregnant and 2 drinks per day for men.  Do not abuse drugs. SEEK IMMEDIATE MEDICAL CARE IF: You have severe chest pain or pressure which may include symptoms such as:  You feel pain or pressure in your arms, neck, jaw, or back.  You have severe back or abdominal pain, feel sick to your stomach (nauseous), or throw up (vomit).  You are sweating profusely.  You are having a fast or irregular heartbeat.  You feel short of breath while at rest.  You notice increasing shortness of breath during rest, sleep, or with activity.  You have chest pain that does not get better after rest or after taking your usual medicine.  You wake from sleep with chest  pain.  You are unable to sleep because you cannot breathe.  You develop a frequent cough or you are coughing up blood.  You feel dizzy, faint, or experience extreme fatigue.  You develop severe weakness, dizziness, fainting, or chills. Any of these symptoms may represent a serious problem that is an emergency. Do not wait to see if the symptoms will go away. Call your local emergency services (911 in the [place] Do not drive yourself to the hospital. MAKE SURE YOU:  Understand these instructions.  Will watch your condition.  Will get help right away if you are not doing well or get worse.   This information is not intended to replace advice given to you by your health care provider. Make sure you discuss any questions you have with your health care provider.   Document Released: 04/02/2010 Document Revised: 03/05/2013 Document Reviewed: 08/30/2012 Elsevier Interactive Patient Education [employer].

## 2015-02-19 ENCOUNTER — Ambulatory Visit: Payer: 59 | Admitting: Family Medicine

## 2015-12-30 ENCOUNTER — Ambulatory Visit (HOSPITAL_COMMUNITY)
Admission: EM | Admit: 2015-12-30 | Discharge: 2015-12-30 | Disposition: A | Discharge status: Home / Self Care | Payer: 59 | Attending: Physician Assistant | Admitting: Physician Assistant

## 2015-12-30 ENCOUNTER — Ambulatory Visit (INDEPENDENT_AMBULATORY_CARE_PROVIDER_SITE_OTHER): Payer: Self-pay

## 2015-12-30 ENCOUNTER — Encounter (HOSPITAL_COMMUNITY): Payer: Self-pay | Admitting: Emergency Medicine

## 2015-12-30 DIAGNOSIS — M5441 Lumbago with sciatica, right side: Secondary | ICD-10-CM

## 2015-12-30 MED ORDER — DICLOFENAC POTASSIUM 50 MG PO TABS: 50.0000 mg | ORAL_TABLET | Freq: Three times a day (TID) | ORAL | 0 refills | Status: DC

## 2015-12-30 MED ORDER — CYCLOBENZAPRINE HCL 10 MG PO TABS: 10.0000 mg | ORAL_TABLET | Freq: Two times a day (BID) | ORAL | 0 refills | Status: DC | PRN

## 2015-12-30 NOTE — ED Provider Notes (Signed)
CSN: [id number]     Arrival date & time 12/30/15  1002 History   First MD Initiated Contact with Patient 12/30/15 1041     Chief Complaint  Patient presents with  . Fall   (Consider location/radiation/quality/duration/timing/severity/associated sxs/prior Treatment) HPI 55 y/o male had a fall injuring his right lower back. Home treatment has not relieved any symptoms. Pain score 5.  History reviewed. No pertinent past medical history. History reviewed. No pertinent surgical history. History reviewed. No pertinent family history. Social History  Substance Use Topics  . Smoking status: Current Every Day Smoker    Packs/day: 1.00  . Smokeless tobacco: Never Used  . Alcohol use 3.6 oz/week    6 Cans of beer per week     Comment: drinks daily    Review of Systems  Denies: HEADACHE, NAUSEA, ABDOMINAL PAIN, CHEST PAIN, CONGESTION, DYSURIA, SHORTNESS OF BREATH  Allergies  Review of patient's allergies indicates no known allergies.  Home Medications   Prior to Admission medications   Medication Sig Start Date End Date Taking? Authorizing Provider  Aspirin-Salicylamide-Caffeine (BC HEADACHE POWDER PO) Take 1 Package by mouth daily.    Historical Provider, MD   Meds Ordered and Administered this Visit  Medications - No data to display  BP 129/70 (BP Location: Left Arm)   Pulse 92   Temp 98 F (36.7 C) (Oral)   Resp 16   SpO2 100%  No data found.   Physical Exam NURSES NOTES AND VITAL SIGNS REVIEWED. CONSTITUTIONAL: Well developed, well nourished, no acute distress HEENT: normocephalic, atraumatic EYES: Conjunctiva normal NECK:normal ROM, supple, no adenopathy PULMONARY:No respiratory distress, normal effort ABDOMINAL: Soft, ND, NT BS+, No CVAT MUSCULOSKELETAL: Normal ROM of all extremities, Palpable tenderness and spasm right lumbar. SKIN: warm and dry without rash PSYCHIATRIC: Mood and affect, behavior are normal  Urgent Care Course   Clinical Course     Procedures (including critical care time)  Labs Review Labs Reviewed - No data to display  Imaging Review Dg Lumbar Spine Complete  Result Date: 12/30/2015 CLINICAL DATA:  [place]. EXAM: LUMBAR SPINE - COMPLETE 4+ VIEW COMPARISON:  No recent . FINDINGS: Mild degenerative changes lumbar spine. Minimal compression of L1 and L5 noted. Age undetermined . Normal alignment mineralization. Aortic atherosclerotic vascular calcification. IMPRESSION: Minimal compression of L1 and L5, age undetermined. Electronically Signed   By: [place]   On: 12/30/2015 11:33     Visual Acuity Review  Right Eye Distance:   Left Eye Distance:   Bilateral Distance:    Right Eye Near:   Left Eye Near:    Bilateral Near:         MDM   1. Acute right-sided low back pain with right-sided sciatica    Patient is reassured that there are no issues that require transfer to higher level of care at this time or additional tests. Patient is advised to continue home symptomatic treatment. Patient is advised that if there are new or worsening symptoms to attend the emergency department, contact primary care provider, or return to UC. Instructions of care provided discharged home in stable condition.    THIS NOTE WAS GENERATED USING A VOICE RECOGNITION SOFTWARE PROGRAM. ALL REASONABLE EFFORTS  WERE MADE TO PROOFREAD THIS DOCUMENT FOR ACCURACY.  I have verbally reviewed the discharge instructions with the patient. A printed AVS was given to the patient.  All questions were answered prior to discharge.     Konrad Felix, [place] 01/01/16 Curly [place]

## 2015-12-30 NOTE — ED Triage Notes (Signed)
The patient presented to the [facility] with a complaint of right side back pain and left leg pain secondary to a fall that occurred from standing 2 days ago. The patient denied any LOC from the fall.

## 2016-01-27 ENCOUNTER — Emergency Department (HOSPITAL_BASED_OUTPATIENT_CLINIC_OR_DEPARTMENT_OTHER): Admit: 2016-01-27 | Discharge: 2016-01-27 | Disposition: A | Discharge status: Home / Self Care | Payer: Medicaid Other

## 2016-01-27 ENCOUNTER — Emergency Department (HOSPITAL_COMMUNITY): Payer: Medicaid Other

## 2016-01-27 ENCOUNTER — Inpatient Hospital Stay (HOSPITAL_COMMUNITY)
Admission: EM | Admit: 2016-01-27 | Discharge: 2016-02-06 | DRG: 166 | Disposition: A | Discharge status: Home / Self Care | Payer: Medicaid Other | Attending: Internal Medicine | Admitting: Internal Medicine

## 2016-01-27 ENCOUNTER — Encounter (HOSPITAL_COMMUNITY): Payer: Self-pay

## 2016-01-27 DIAGNOSIS — Z7982 Long term (current) use of aspirin: Secondary | ICD-10-CM

## 2016-01-27 DIAGNOSIS — R918 Other nonspecific abnormal finding of lung field: Secondary | ICD-10-CM | POA: Diagnosis present

## 2016-01-27 DIAGNOSIS — M79609 Pain in unspecified limb: Secondary | ICD-10-CM | POA: Diagnosis not present

## 2016-01-27 DIAGNOSIS — D509 Iron deficiency anemia, unspecified: Secondary | ICD-10-CM | POA: Diagnosis present

## 2016-01-27 DIAGNOSIS — S32048A Other fracture of fourth lumbar vertebra, initial encounter for closed fracture: Secondary | ICD-10-CM | POA: Diagnosis present

## 2016-01-27 DIAGNOSIS — S50311A Abrasion of right elbow, initial encounter: Secondary | ICD-10-CM | POA: Diagnosis present

## 2016-01-27 DIAGNOSIS — E43 Unspecified severe protein-calorie malnutrition: Secondary | ICD-10-CM | POA: Diagnosis present

## 2016-01-27 DIAGNOSIS — W19XXXA Unspecified fall, initial encounter: Secondary | ICD-10-CM | POA: Diagnosis present

## 2016-01-27 DIAGNOSIS — F17210 Nicotine dependence, cigarettes, uncomplicated: Secondary | ICD-10-CM | POA: Diagnosis present

## 2016-01-27 DIAGNOSIS — Z79899 Other long term (current) drug therapy: Secondary | ICD-10-CM

## 2016-01-27 DIAGNOSIS — Z6820 Body mass index (BMI) 20.0-20.9, adult: Secondary | ICD-10-CM

## 2016-01-27 DIAGNOSIS — R42 Dizziness and giddiness: Secondary | ICD-10-CM

## 2016-01-27 DIAGNOSIS — J189 Pneumonia, unspecified organism: Secondary | ICD-10-CM | POA: Diagnosis present

## 2016-01-27 DIAGNOSIS — D638 Anemia in other chronic diseases classified elsewhere: Secondary | ICD-10-CM | POA: Diagnosis present

## 2016-01-27 DIAGNOSIS — M79605 Pain in left leg: Secondary | ICD-10-CM | POA: Diagnosis present

## 2016-01-27 DIAGNOSIS — I739 Peripheral vascular disease, unspecified: Secondary | ICD-10-CM | POA: Diagnosis present

## 2016-01-27 DIAGNOSIS — R509 Fever, unspecified: Secondary | ICD-10-CM

## 2016-01-27 DIAGNOSIS — C3411 Malignant neoplasm of upper lobe, right bronchus or lung: Secondary | ICD-10-CM

## 2016-01-27 DIAGNOSIS — Z9889 Other specified postprocedural states: Secondary | ICD-10-CM

## 2016-01-27 DIAGNOSIS — R222 Localized swelling, mass and lump, trunk: Secondary | ICD-10-CM

## 2016-01-27 DIAGNOSIS — J90 Pleural effusion, not elsewhere classified: Secondary | ICD-10-CM | POA: Diagnosis present

## 2016-01-27 DIAGNOSIS — Z720 Tobacco use: Secondary | ICD-10-CM | POA: Diagnosis present

## 2016-01-27 DIAGNOSIS — I745 Embolism and thrombosis of iliac artery: Secondary | ICD-10-CM

## 2016-01-27 DIAGNOSIS — C3491 Malignant neoplasm of unspecified part of right bronchus or lung: Principal | ICD-10-CM | POA: Diagnosis present

## 2016-01-27 DIAGNOSIS — S80812A Abrasion, left lower leg, initial encounter: Secondary | ICD-10-CM | POA: Diagnosis present

## 2016-01-27 DIAGNOSIS — R590 Localized enlarged lymph nodes: Secondary | ICD-10-CM | POA: Diagnosis present

## 2016-01-27 DIAGNOSIS — I2510 Atherosclerotic heart disease of native coronary artery without angina pectoris: Secondary | ICD-10-CM | POA: Diagnosis present

## 2016-01-27 DIAGNOSIS — Y92239 Unspecified place in hospital as the place of occurrence of the external cause: Secondary | ICD-10-CM | POA: Diagnosis present

## 2016-01-27 DIAGNOSIS — X58XXXA Exposure to other specified factors, initial encounter: Secondary | ICD-10-CM | POA: Diagnosis present

## 2016-01-27 DIAGNOSIS — S32038A Other fracture of third lumbar vertebra, initial encounter for closed fracture: Secondary | ICD-10-CM | POA: Diagnosis present

## 2016-01-27 DIAGNOSIS — S32009A Unspecified fracture of unspecified lumbar vertebra, initial encounter for closed fracture: Secondary | ICD-10-CM | POA: Diagnosis present

## 2016-01-27 DIAGNOSIS — R634 Abnormal weight loss: Secondary | ICD-10-CM | POA: Diagnosis present

## 2016-01-27 DIAGNOSIS — F1010 Alcohol abuse, uncomplicated: Secondary | ICD-10-CM | POA: Diagnosis present

## 2016-01-27 DIAGNOSIS — J439 Emphysema, unspecified: Secondary | ICD-10-CM | POA: Diagnosis present

## 2016-01-27 DIAGNOSIS — D473 Essential (hemorrhagic) thrombocythemia: Secondary | ICD-10-CM | POA: Diagnosis present

## 2016-01-27 LAB — VAS US LOWER EXTREMITY ARTERIAL DUPLEX
LATIBDISTSYS: 11 cm/s
LEFT PERO DIST SYS: -8 cm/s
LEFT PERO MID SYS: -15 cm/s
LPERPSV: -15 cm/s
LPOPDPSV: -17 cm/s
LPOPPPSV: 21 cm/s
LPTIBPROXSYS: 16 cm/s
LSFDPSV: -22 cm/s
LSFPPSV: 33 cm/s
Left ant tibial mid sys: 13 cm/s
Left ant tibial sys PSV: 18 cm/s
Left super femoral mid sys PSV: 38 cm/s
left post tibial dist sys: 16 cm/s
left post tibial mid sys: 15 cm/s

## 2016-01-27 LAB — COMPREHENSIVE METABOLIC PANEL
ALK PHOS: 289 U/L — AB (ref 38–126)
ALT: 13 U/L — AB (ref 17–63)
AST: 20 U/L (ref 15–41)
Albumin: 1.9 g/dL — ABNORMAL LOW (ref 3.5–5.0)
Anion gap: 7 (ref 5–15)
BILIRUBIN TOTAL: 0.2 mg/dL — AB (ref 0.3–1.2)
BUN: 9 mg/dL (ref 6–20)
CALCIUM: 8.5 mg/dL — AB (ref 8.9–10.3)
CHLORIDE: 106 mmol/L (ref 101–111)
CO2: 22 mmol/L (ref 22–32)
CREATININE: 0.66 mg/dL (ref 0.61–1.24)
Glucose, Bld: 91 mg/dL (ref 65–99)
Potassium: 4.6 mmol/L (ref 3.5–5.1)
Sodium: 135 mmol/L (ref 135–145)
TOTAL PROTEIN: 7.9 g/dL (ref 6.5–8.1)

## 2016-01-27 LAB — CBC WITH DIFFERENTIAL/PLATELET
BASOS PCT: 0 %
Basophils Absolute: 0 10*3/uL (ref 0.0–0.1)
EOS ABS: 0.3 10*3/uL (ref 0.0–0.7)
EOS PCT: 2 %
HEMATOCRIT: 23.9 % — AB (ref 39.0–52.0)
Hemoglobin: 7.4 g/dL — ABNORMAL LOW (ref 13.0–17.0)
LYMPHS PCT: 15 %
Lymphs Abs: 2.6 10*3/uL (ref 0.7–4.0)
MCH: 20.2 pg — AB (ref 26.0–34.0)
MCHC: 31 g/dL (ref 30.0–36.0)
MCV: 65.3 fL — AB (ref 78.0–100.0)
MONO ABS: 1.5 10*3/uL — AB (ref 0.1–1.0)
Monocytes Relative: 9 %
NEUTROS ABS: 12.6 10*3/uL — AB (ref 1.7–7.7)
Neutrophils Relative %: 74 %
PLATELETS: 737 10*3/uL — AB (ref 150–400)
RBC: 3.66 MIL/uL — ABNORMAL LOW (ref 4.22–5.81)
RDW: 17.4 % — ABNORMAL HIGH (ref 11.5–15.5)
WBC: 17 10*3/uL — ABNORMAL HIGH (ref 4.0–10.5)

## 2016-01-27 LAB — URINALYSIS, ROUTINE W REFLEX MICROSCOPIC
BILIRUBIN URINE: NEGATIVE
Glucose, UA: NEGATIVE mg/dL
HGB URINE DIPSTICK: NEGATIVE
KETONES UR: NEGATIVE mg/dL
Leukocytes, UA: NEGATIVE
NITRITE: NEGATIVE
PROTEIN: NEGATIVE mg/dL
Specific Gravity, Urine: 1.022 (ref 1.005–1.030)
pH: 6 (ref 5.0–8.0)

## 2016-01-27 LAB — POC OCCULT BLOOD, ED: Fecal Occult Bld: NEGATIVE

## 2016-01-27 MED ORDER — LORAZEPAM 2 MG/ML IJ SOLN: 0.0000 mg | Freq: Four times a day (QID) | INTRAMUSCULAR | Status: AC

## 2016-01-27 MED ORDER — OXYCODONE-ACETAMINOPHEN 5-325 MG PO TABS
2.0000 | ORAL_TABLET | Freq: Once | ORAL | Status: AC
  Administered 2016-01-27: 2 via ORAL
  Filled 2016-01-27: qty 2

## 2016-01-27 MED ORDER — ACETAMINOPHEN 325 MG PO TABS
650.0000 mg | ORAL_TABLET | Freq: Four times a day (QID) | ORAL | Status: DC | PRN
  Administered 2016-01-29 – 2016-02-05 (×8): 650 mg via ORAL
  Filled 2016-01-27 (×8): qty 2

## 2016-01-27 MED ORDER — ADULT MULTIVITAMIN W/MINERALS CH
1.0000 | ORAL_TABLET | Freq: Every day | ORAL | Status: DC
  Administered 2016-01-28 – 2016-02-06 (×9): 1 via ORAL
  Filled 2016-01-27 (×9): qty 1

## 2016-01-27 MED ORDER — FOLIC ACID 1 MG PO TABS
1.0000 mg | ORAL_TABLET | Freq: Every day | ORAL | Status: DC
  Administered 2016-01-28 – 2016-02-06 (×9): 1 mg via ORAL
  Filled 2016-01-27 (×9): qty 1

## 2016-01-27 MED ORDER — SODIUM CHLORIDE 0.9 % IV SOLN
INTRAVENOUS | Status: AC
  Administered 2016-01-27: via INTRAVENOUS

## 2016-01-27 MED ORDER — IOPAMIDOL (ISOVUE-370) INJECTION 76%
100.0000 mL | Freq: Once | INTRAVENOUS | Status: AC | PRN
  Administered 2016-01-27: 100 mL via INTRAVENOUS

## 2016-01-27 MED ORDER — LORAZEPAM 1 MG PO TABS: 1.0000 mg | ORAL_TABLET | Freq: Four times a day (QID) | ORAL | Status: AC | PRN

## 2016-01-27 MED ORDER — LORAZEPAM 2 MG/ML IJ SOLN: 0.0000 mg | Freq: Two times a day (BID) | INTRAMUSCULAR | Status: AC

## 2016-01-27 MED ORDER — LORAZEPAM 2 MG/ML IJ SOLN: 1.0000 mg | Freq: Four times a day (QID) | INTRAMUSCULAR | Status: AC | PRN

## 2016-01-27 MED ORDER — THIAMINE HCL 100 MG/ML IJ SOLN
100.0000 mg | Freq: Every day | INTRAMUSCULAR | Status: DC
  Administered 2016-02-03: 100 mg via INTRAVENOUS
  Filled 2016-01-27 (×3): qty 2

## 2016-01-27 MED ORDER — ONDANSETRON HCL 4 MG/2ML IJ SOLN: 4.0000 mg | Freq: Four times a day (QID) | INTRAMUSCULAR | Status: DC | PRN

## 2016-01-27 MED ORDER — NICOTINE 14 MG/24HR TD PT24
14.0000 mg | MEDICATED_PATCH | Freq: Every day | TRANSDERMAL | Status: DC
  Administered 2016-01-27 – 2016-02-05 (×10): 14 mg via TRANSDERMAL
  Filled 2016-01-27 (×10): qty 1

## 2016-01-27 MED ORDER — ONDANSETRON HCL 4 MG PO TABS: 4.0000 mg | ORAL_TABLET | Freq: Four times a day (QID) | ORAL | Status: DC | PRN

## 2016-01-27 MED ORDER — ACETAMINOPHEN 650 MG RE SUPP: 650.0000 mg | Freq: Four times a day (QID) | RECTAL | Status: DC | PRN

## 2016-01-27 MED ORDER — ONDANSETRON 4 MG PO TBDP
4.0000 mg | ORAL_TABLET | Freq: Once | ORAL | Status: AC
  Administered 2016-01-27: 4 mg via ORAL
  Filled 2016-01-27: qty 1

## 2016-01-27 MED ORDER — VITAMIN B-1 100 MG PO TABS
100.0000 mg | ORAL_TABLET | Freq: Every day | ORAL | Status: DC
  Administered 2016-01-28 – 2016-02-06 (×8): 100 mg via ORAL
  Filled 2016-01-27 (×9): qty 1

## 2016-01-27 NOTE — ED Notes (Signed)
Patient transported to Ultrasound 

## 2016-01-27 NOTE — Progress Notes (Signed)
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

## 2016-01-27 NOTE — ED Provider Notes (Signed)
[place] DEPT Provider Note   CSN: [id number] Arrival date & time: 01/27/16  1114     History   Chief Complaint Chief Complaint  Patient presents with  . left knee/leg pain    HPI [name] Chapman is a 55 y.o. male smoker with no reported past medical history presents to the ED noting an approximately 1 year history of LLE paresthesias from his knee down to his foot, that is worse with exertion and alleviated with rest. He states that this is been insidiously worsening over the last month or so. He denies any falls of trauma to the area. Additionally the patient notes an approximately 30-40lb weight loss over the last few months. He has had a chronic non-productive cough, but no fever, night sweats. No hx of cancer, COPD, or known PVD.    HPI  History reviewed. No pertinent past medical history.  Patient Active Problem List   Diagnosis Date Noted  . Protein-calorie malnutrition, severe 01/28/2016  . Mass of right lung 01/27/2016  . Leg pain, inferior, left 01/27/2016  . Microcytic hypochromic anemia 01/27/2016  . Lung mass 01/27/2016    History reviewed. No pertinent surgical history.     Home Medications    Prior to Admission medications   Medication Sig Start Date End Date Taking? Authorizing Provider  acetaminophen (TYLENOL) 500 MG tablet Take 1,000 mg by mouth every 6 (six) hours as needed.   Yes Historical Provider, MD  Aspirin-Salicylamide-Caffeine (BC HEADACHE POWDER PO) Take 1 Package by mouth daily.   Yes Historical Provider, MD  cyclobenzaprine (FLEXERIL) 10 MG tablet Take 1 tablet (10 mg total) by mouth 2 (two) times daily as needed for muscle spasms. Patient not taking: Reported on 01/27/2016 12/30/15   Konrad Felix, PA  diclofenac (CATAFLAM) 50 MG tablet Take 1 tablet (50 mg total) by mouth 3 (three) times daily. Patient not taking: Reported on 01/27/2016 12/30/15   Konrad Felix, PA    Family History Family History  Problem Relation Age of  Onset  . CAD Father     Social History Social History  Substance Use Topics  . Smoking status: Current Every Day Smoker    Packs/day: 1.00  . Smokeless tobacco: Never Used  . Alcohol use 3.6 oz/week    6 Cans of beer per week     Comment: drinks daily     Allergies   Patient has no known allergies.   Review of Systems Review of Systems  Constitutional: Positive for activity change. Negative for appetite change, chills, diaphoresis and fever.  HENT: Negative for congestion, rhinorrhea, sinus pressure and sneezing.   Respiratory: Positive for cough. Negative for chest tightness and shortness of breath.   Cardiovascular: Negative for chest pain, palpitations and leg swelling.  Gastrointestinal: Negative for abdominal pain, nausea and vomiting.  Musculoskeletal: Positive for myalgias (claudication frmo knee down in left leg).  Skin: Negative for wound.  Neurological: Positive for numbness (paresthesias in claudication in left leg). Negative for dizziness, syncope, weakness, light-headedness and headaches.  All other systems reviewed and are negative.    Physical Exam Updated Vital Signs BP 107/63 (BP Location: Left Arm)   Pulse (!) 102   Temp 98.7 F (37.1 C) (Oral)   Resp 18   Ht '5\' 6"'  (1.676 m)   Wt 57.3 kg   SpO2 94%   BMI 20.39 kg/m   Physical Exam  Constitutional: He is oriented to person, place, and time. He appears cachectic. He is cooperative. No  distress.  HENT:  Head: Normocephalic and atraumatic.  Nose: Nose normal.  Mouth/Throat: Oropharynx is clear and moist.  Eyes: Conjunctivae and EOM are normal. Pupils are equal, round, and reactive to light.  Neck: Normal range of motion. Neck supple.  Cardiovascular: Normal rate, regular rhythm and normal heart sounds.  Exam reveals decreased pulses.   Pulses:      Femoral pulses are 2+ on the right side, and 1+ on the left side.      Dorsalis pedis pulses are 0 on the left side.       Posterior tibial pulses  are 2+ on the right side, and 0 on the left side.  Pulmonary/Chest: Effort normal. No respiratory distress. He has decreased breath sounds in the right middle field and the right lower field.  Abdominal: Soft. He exhibits no distension. There is no tenderness.  Genitourinary: Rectal exam shows guaiac negative stool.  Musculoskeletal: He exhibits no edema or tenderness.  Neurological: He is alert and oriented to person, place, and time. No cranial nerve deficit or sensory deficit. He exhibits normal muscle tone. Coordination normal.  Skin: Skin is warm and dry. He is not diaphoretic.  Nursing note and vitals reviewed.    ED Treatments / Results  Labs (all labs ordered are listed, but only abnormal results are displayed) Labs Reviewed  CBC WITH DIFFERENTIAL/PLATELET - Abnormal; Notable for the following:       Result Value   WBC 17.0 (*)    RBC 3.66 (*)    Hemoglobin 7.4 (*)    HCT 23.9 (*)    MCV 65.3 (*)    MCH 20.2 (*)    RDW 17.4 (*)    Platelets 737 (*)    Neutro Abs 12.6 (*)    Monocytes Absolute 1.5 (*)    All other components within normal limits  COMPREHENSIVE METABOLIC PANEL - Abnormal; Notable for the following:    Calcium 8.5 (*)    Albumin 1.9 (*)    ALT 13 (*)    Alkaline Phosphatase 289 (*)    Total Bilirubin 0.2 (*)    All other components within normal limits  IRON AND TIBC - Abnormal; Notable for the following:    Iron 7 (*)    TIBC 176 (*)    Saturation Ratios 4 (*)    All other components within normal limits  FERRITIN - Abnormal; Notable for the following:    Ferritin 651 (*)    All other components within normal limits  RETICULOCYTES - Abnormal; Notable for the following:    RBC. 3.56 (*)    All other components within normal limits  COMPREHENSIVE METABOLIC PANEL - Abnormal; Notable for the following:    Sodium 133 (*)    Creatinine, Ser 0.59 (*)    Calcium 8.6 (*)    Albumin 1.9 (*)    ALT 10 (*)    Alkaline Phosphatase 283 (*)    All other  components within normal limits  CBC WITH DIFFERENTIAL/PLATELET - Abnormal; Notable for the following:    WBC 16.6 (*)    RBC 3.59 (*)    Hemoglobin 7.0 (*)    HCT 23.3 (*)    MCV 64.9 (*)    MCH 19.5 (*)    RDW 17.4 (*)    Platelets 760 (*)    Neutro Abs 12.9 (*)    Monocytes Absolute 1.7 (*)    All other components within normal limits  PROTIME-INR - Abnormal; Notable for the following:  Prothrombin Time 16.8 (*)    All other components within normal limits  URINALYSIS, ROUTINE W REFLEX MICROSCOPIC (NOT AT [facility])  VITAMIN B12  FOLATE  OCCULT BLOOD X 1 CARD TO LAB, STOOL  POC OCCULT BLOOD, ED  TYPE AND SCREEN  ABO/RH  PREPARE RBC (CROSSMATCH)    EKG  EKG Interpretation None       Radiology Dg Chest 2 View  Result Date: 01/27/2016 CLINICAL DATA:  Intermittent left lower extremity pain after the last year. 30 pound weight loss over the last 2 months. EXAM: CHEST  2 VIEW COMPARISON:  Chest CT 06/16/2007. FINDINGS: Heart size is normal. Mediastinal shadows are normal. Left lung is clear. There is consolidation and collapse in the right lower lobe. Right upper lobe is clear. Some volume loss in the right middle lobe. There is some pleural density on the right laterally. IMPRESSION: Consolidation and volume loss in the right lower lobe. Mild volume loss in the right middle lobe. Pleural density on the right laterally. Findings could be due to chronic pneumonia with volume loss, but the possibility of malignant disease does exist. Chest CT with contrast suggested when able. Electronically Signed   By: Nelson Chimes M.D.   On: 01/27/2016 12:59   Ct Angio Chest/abd/pel For Dissection W And/or W/wo  Result Date: 01/27/2016 CLINICAL DATA:  Intermittent LEFT lower extremity pain for 1 year and intermittent foot numbness. 30 pound weight loss in 2 months. Follow-up possible chest malignancy. EXAM: CT ANGIOGRAPHY CHEST, ABDOMEN AND PELVIS TECHNIQUE: Multidetector CT imaging through the  chest, abdomen and pelvis was performed using the standard protocol during bolus administration of intravenous contrast. Multiplanar reconstructed images and MIPs were obtained and reviewed to evaluate the vascular anatomy. CONTRAST:  80 cc Isovue 370 COMPARISON:  Chest radiograph January 27, 2016 at 1238 hours FINDINGS: CTA CHEST FINDINGS CARDIOVASCULAR: Thoracic aorta is normal course and caliber. No intrinsic density on noncontrast CT. Homogeneous contrast opacification of thoracic aorta without dissection, aneurysm, luminal irregularity, periaortic fluid collections, or contrast extravasation. Trace coronary artery calcifications. Heart size is normal. No pericardial effusion. MEDIASTINUM/NODES: 2.5 mm sub carinal nodal conglomeration. 12 mm RIGHT anterior mediastinal/ prevascular necrotic lymph node. RIGHT hilar lymphadenopathy contiguous with RIGHT lung mass. LUNGS/PLEURA: 9.1 x 13.2 x 13.9 cm RIGHT lung base mass contiguous with the RIGHT hilum, and is contiguous with the diaphragm and pleura. Effaced RIGHT lower lobe bronchi. RIGHT hilar lymphadenopathy narrows the RIGHT middle lobe bronchi. Mass encases the RIGHT lower lobe pulmonary arteries which are patent. No chest wall invasion. Patchy ground-glass nodules and opacities RIGHT lower lobe. LEFT lung base dependent atelectasis. Mild centrilobular emphysema. 3 mm RIGHT middle lobe pulmonary nodule versus granuloma. MUSCULOSKELETAL: Nonsuspicious. Sub cm LEFT thyroid nodule below size followup recommendations. Review of the MIP images confirms the above findings. CTA ABDOMEN AND PELVIS FINDINGS ARTERIES: Abdominal aorta is normal course and caliber. Mild calcific atherosclerosis. 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 Reconstitution of distal LEFT external iliac artery. Reconstitution of distal LEFT internal iliac arteries. Homogeneous contrast opacification of the aorta vessels without dissection,  aneurysm, luminal irregularity, periaortic fluid collections, or contrast extravasation. Celiac axis, superior and inferior mesenteric arteries are patent. Moderate stenosis inferior mesenteric artery origin. HEPATOBILIARY: Liver and gallbladder are normal. PANCREAS: Normal. SPLEEN: Normal. ADRENALS/URINARY TRACT: Kidneys are orthotopic, demonstrating symmetric enhancement. No nephrolithiasis, hydronephrosis or solid renal masses. 4.8 cm RIGHT interpolar benign-appearing cyst. The unopacified ureters are normal in course and caliber. Urinary bladder is  well distended and unremarkable. Normal adrenal glands. STOMACH/BOWEL: The stomach, small and large bowel are normal in course and caliber without inflammatory changes decreased sensitivity without enteric contrast. Normal appendix. VASCULAR/LYMPHATIC: No lymphadenopathy by CT size criteria. REPRODUCTIVE: Normal. OTHER: No intraperitoneal free fluid or free air. MUSCULOSKELETAL: Nonacute. Bridging sacroiliac osteophytes. Nondisplaced RIGHT L3, L4 healing transverse process fractures. Review of the MIP images confirms the above findings. IMPRESSION: CTA CHEST: 9 x 1 x 13.2 x 13.9 cm RIGHT lung mass contiguous with the hilum compatible with neoplasm, primary lung versus lymphoma. Mediastinal lymphadenopathy. RIGHT lower lobe probable postobstructive pneumonia. Small RIGHT pleural effusion. Moderate emphysema. No acute vascular process. 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 Reconstitution distal LEFT internal external iliac arteries. Mild atherosclerosis. Acute to subacute nondisplaced RIGHT L3 and L4 transverse process fractures appear traumatic, not pathologic. Acute findings discussed with and reconfirmed by Dr.Mahogony Gilchrest on 01/27/2016 at 5:45 pm. Electronically Signed   By: Elon Alas M.D.   On: 01/27/2016 17:50    Procedures Procedures (including critical care time)  Medications Ordered in  ED Medications  LORazepam (ATIVAN) tablet 1 mg (not administered)    Or  LORazepam (ATIVAN) injection 1 mg (not administered)  thiamine (VITAMIN B-1) tablet 100 mg (100 mg Oral Given 01/28/16 1122)    Or  thiamine (B-1) injection 100 mg ( Intravenous See Alternative 14/97/02 6378)  folic acid (FOLVITE) tablet 1 mg (1 mg Oral Given 01/28/16 1122)  multivitamin with minerals tablet 1 tablet (1 tablet Oral Given 01/28/16 1122)  acetaminophen (TYLENOL) tablet 650 mg (not administered)    Or  acetaminophen (TYLENOL) suppository 650 mg (not administered)  ondansetron (ZOFRAN) tablet 4 mg (not administered)    Or  ondansetron (ZOFRAN) injection 4 mg (not administered)  LORazepam (ATIVAN) injection 0-4 mg (0 mg Intravenous Not Given 01/28/16 0545)    Followed by  LORazepam (ATIVAN) injection 0-4 mg (not administered)  0.9 %  sodium chloride infusion ( Intravenous New Bag/Given 01/27/16 2350)  nicotine (NICODERM CQ - dosed in mg/24 hours) patch 14 mg (14 mg Transdermal Patch Applied 01/27/16 2357)  ipratropium-albuterol (DUONEB) 0.5-2.5 (3) MG/3ML nebulizer solution 3 mL (3 mLs Nebulization Given 01/28/16 0803)  albuterol (PROVENTIL) (2.5 MG/3ML) 0.083% nebulizer solution 2.5 mg (not administered)  feeding supplement (ENSURE ENLIVE) (ENSURE ENLIVE) liquid 237 mL (237 mLs Oral Given 01/28/16 1121)  0.9 %  sodium chloride infusion (not administered)  acetaminophen (TYLENOL) tablet 650 mg (not administered)  diphenhydrAMINE (BENADRYL) injection 25 mg (not administered)  furosemide (LASIX) injection 20 mg (not administered)  oxyCODONE-acetaminophen (PERCOCET/ROXICET) 5-325 MG per tablet 2 tablet (2 tablets Oral Given 01/27/16 1459)  ondansetron (ZOFRAN-ODT) disintegrating tablet 4 mg (4 mg Oral Given 01/27/16 1500)  iopamidol (ISOVUE-370) 76 % injection 100 mL (100 mLs Intravenous Contrast Given 01/27/16 1714)     Initial Impression / Assessment and Plan / ED Course  I have reviewed the triage  vital signs and the nursing notes.  Pertinent labs & imaging results that were available during my care of the patient were reviewed by me and considered in my medical decision making (see chart for details).  Clinical Course   55 y.o. male presents with claudication sx in his LLE. Found to have no evidence of focal occlusion on duplex of his LLE but found monophasic flow throughout suggesting proximal obstruction.    CXR was done and showed evidence concerning for mass in his right lung.  Given CXR findings and occlusive  concerning LE duplex, CTA chest/abd/pelvis was done to further evaluate.  His left iliac artery was found to be occluded and then reconstituted distal to the occlusion. Spoke with Dr. Ulice Dash from vascular who stated that this is likely a chronic occlusion and that nothing acute needed to be done, did not recommend anticoagulation. He states that this could potentially be stented in the future.   CTA also unfortunately shows large right lung mass concerning for possible primary lung mass vs lymphoma.  Labs returned showing elevated alk phos at 289, WBC 17, Hg 7.4, plt 737.  FOBT negative.  Oncology, Dr. Irene Limbo was consulted and agreed to see the patient and help facilitate biopsy and initiation of treatment.   Given the patient's lack of a primary care physician the decision was made to admit him to the hospitalist service for further care and assessment and to facilitate the workup of his unfortunate diagnosis.This plan was discussed with the patient at the bedside and he stated both understanding and agreement.    Final Clinical Impressions(s) / ED Diagnoses   Final diagnoses:  Leg pain, inferior, left  Mass of right lung  Occlusion of left iliac artery [facility])    New Prescriptions Current Discharge Medication List       Zenovia Jarred, DO 01/28/16 [place], MD 01/29/16 2223

## 2016-01-27 NOTE — Progress Notes (Signed)
Pt has arrived on the floor. Dr.Kakrakandy has been text paged.

## 2016-01-27 NOTE — ED Triage Notes (Signed)
Patient here with left knee pain radiating to foot for months. Denies trauma. States that the pain is worse with ambulation

## 2016-01-27 NOTE — ED Provider Notes (Signed)
[place] DEPT Provider Note   CSN: [id number] Arrival date & time: 01/27/16  1114  By signing my name below, I, [name] Chapman, attest that this documentation has been prepared under the direction and in the presence of non-physician practitioner, Margarita Mail, PA-C. Electronically Signed: Evelene Chapman, Scribe. 01/27/2016. 12:28 PM.    History   Chief Complaint Chief Complaint  Patient presents with  . left knee/leg pain    The history is provided by the patient. No language interpreter was used.     HPI Comments:  [name] Chapman is a 55 y.o. male who presents to the Emergency Department complaining of intermittent, moderate LLE pain x 1 year. He notes pain in the left thigh down to the left foot with associated intermittent numbness in the foot. He has noticed an area of darkening in the middle left toe over the past week. Pt states his pain is worse when he goes up and down the stairs. Pt also notes he has unitnteionally lost 30 pounds in ~ 2 months. He denies SOB, numbness to his groin, bowel/bladder incontinence, abdominal pain and abdominal distention. Pt is a current smoker with no significant PMHx.     History reviewed. No pertinent past medical history.  There are no active problems to display for this patient.   History reviewed. No pertinent surgical history.     Home Medications    Prior to Admission medications   Medication Sig Start Date End Date Taking? Authorizing Provider  Aspirin-Salicylamide-Caffeine (BC HEADACHE POWDER PO) Take 1 Package by mouth daily.    Historical Provider, MD  cyclobenzaprine (FLEXERIL) 10 MG tablet Take 1 tablet (10 mg total) by mouth 2 (two) times daily as needed for muscle spasms. 12/30/15   Konrad Felix, PA  diclofenac (CATAFLAM) 50 MG tablet Take 1 tablet (50 mg total) by mouth 3 (three) times daily. 12/30/15   Konrad Felix, PA    Family History No family history on file.  Social History Social History    Substance Use Topics  . Smoking status: Current Every Day Smoker    Packs/day: 1.00  . Smokeless tobacco: Never Used  . Alcohol use 3.6 oz/week    6 Cans of beer per week     Comment: drinks daily     Allergies   Patient has no known allergies.   Review of Systems Review of Systems  Constitutional: Positive for activity change, appetite change and unexpected weight change. Negative for chills, diaphoresis and fever.       Denies fevers, chills, night sweats. Acknowledges fatigue, loss of appetite, and unexplained weight loss.  HENT: Negative.   Eyes: Negative.   Respiratory: Positive for cough (chronic). Negative for shortness of breath.   Cardiovascular: Negative for chest pain and leg swelling.  Gastrointestinal: Negative for abdominal distention, abdominal pain, anal bleeding, blood in stool, diarrhea, nausea and vomiting.  Endocrine: Positive for polyuria.  Genitourinary: Negative.   Musculoskeletal: Positive for myalgias.  Skin: Positive for color change. Negative for wound.  Neurological: Positive for numbness. Negative for weakness.  Hematological: Negative.   All other systems reviewed and are negative. Ten systems reviewed and are negative for acute change, except as noted in the HPI.     Physical Exam Updated Vital Signs BP 123/80 (BP Location: Left Arm)   Pulse 96   Temp 97.7 F (36.5 C) (Oral)   Resp 20   SpO2 99%   Physical Exam  Constitutional: He is oriented to person, place, and  time. No distress.  Very thin Appears protein malnourished   HENT:  Head: Normocephalic.  Eyes: Conjunctivae are normal.  Neck: Normal range of motion. Neck supple.  Cardiovascular: Normal rate.   Absent left femoral, popliteal, DP pulses   Pulmonary/Chest: Effort normal.  Abdominal: He exhibits no distension.  Musculoskeletal: Normal range of motion.  Neurological: He is alert and oriented to person, place, and time.  Skin: Skin is warm and dry.  Psychiatric: He  has a normal mood and affect.  Nursing note and vitals reviewed.    ED Treatments / Results  DIAGNOSTIC STUDIES:  Oxygen Saturation is 99% on RA, normal by my interpretation.    COORDINATION OF CARE:  12:27 PM Discussed treatment plan with pt at bedside and pt agreed to plan.  Labs (all labs ordered are listed, but only abnormal results are displayed) Labs Reviewed  CBC WITH DIFFERENTIAL/PLATELET - Abnormal; Notable for the following:       Result Value   WBC 17.0 (*)    RBC 3.66 (*)    Hemoglobin 7.4 (*)    HCT 23.9 (*)    MCV 65.3 (*)    MCH 20.2 (*)    RDW 17.4 (*)    Platelets 737 (*)    All other components within normal limits  COMPREHENSIVE METABOLIC PANEL  URINALYSIS, ROUTINE W REFLEX MICROSCOPIC (NOT AT [facility])    EKG  EKG Interpretation None       Radiology Dg Chest 2 View  Result Date: 01/27/2016 CLINICAL DATA:  Intermittent left lower extremity pain after the last year. 30 pound weight loss over the last 2 months. EXAM: CHEST  2 VIEW COMPARISON:  Chest CT 06/16/2007. FINDINGS: Heart size is normal. Mediastinal shadows are normal. Left lung is clear. There is consolidation and collapse in the right lower lobe. Right upper lobe is clear. Some volume loss in the right middle lobe. There is some pleural density on the right laterally. IMPRESSION: Consolidation and volume loss in the right lower lobe. Mild volume loss in the right middle lobe. Pleural density on the right laterally. Findings could be due to chronic pneumonia with volume loss, but the possibility of malignant disease does exist. Chest CT with contrast suggested when able. Electronically Signed   By: Nelson Chimes M.D.   On: 01/27/2016 12:59    Procedures Procedures (including critical care time)  Medications Ordered in ED Medications  oxyCODONE-acetaminophen (PERCOCET/ROXICET) 5-325 MG per tablet 2 tablet (not administered)  ondansetron (ZOFRAN-ODT) disintegrating tablet 4 mg (not administered)      Initial Impression / Assessment and Plan / ED Course  I have reviewed the triage vital signs and the nursing notes.  Pertinent labs & imaging results that were available during my care of the patient were reviewed by me and considered in my medical decision making (see chart for details).  Clinical Course     Patient with pain in leg. I suspect arterial occlusion. DDX includes radiculopathy. I have ordered labs and arterial duplex. I have given sign out to Dr. Rockne Menghini   Final Clinical Impressions(s) / ED Diagnoses   Final diagnoses:  None    New Prescriptions New Prescriptions   No medications on file   I personally performed the services described in this documentation, which was scribed in my presence. The recorded information has been reviewed and is accurate.       Margarita Mail, PA-C 01/28/16 0720    Margarita Mail, PA-C 01/28/16 1610    Quillian Quince  Laneta Simmers, MD 01/28/16 1157

## 2016-01-27 NOTE — ED Triage Notes (Signed)
PT reported to A. Harris ,PA he has had a 30lb weight loss .

## 2016-01-27 NOTE — H&P (Addendum)
History and Physical    [name] Chapman FGH:[id number] DOB: [birth year] DOA: 01/27/2016  PCP: No PCP Per Patient  Patient coming from: Home.  Chief Complaint: Left lower extremity pain.  HPI: [name] Chapman is a 55 y.o. male with tobacco abuse presents to the ER because of worsening left lower extremity pain over the last few days. Denies any fall or trauma. Denies any chest pain or shortness of breath. Has been having some productive cough and weight loss of 30 pounds last few months. 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 On-call vascular surgeon was consulted and vascular surgeon requested outpatient workup for the lower extremity pain and iliac artery occlusion. ER physician had discussed with on-call oncologist who advised admission and workup for the right lung mass since patient does not have a PCP. Labs also revealed microcytic hypochromic anemia which is new. Patient denies any blood in the stools. Stool for occult blood was negative.  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  Review of Systems: As per HPI, rest all negative.   History reviewed. No pertinent past medical history.  History reviewed. No pertinent surgical history.   reports that he has been smoking.  He has been smoking about 1.00 pack per day. He has never used smokeless tobacco. He reports that he drinks about 3.6 oz of alcohol per week . He reports that he does not use drugs.  No Known Allergies  Family History  Problem Relation Age of Onset  . CAD Father     Prior to Admission medications   Medication Sig Start Date End Date Taking? Authorizing Provider  acetaminophen (TYLENOL) 500 MG tablet Take 1,000 mg by mouth every 6 (six) hours as needed.   Yes Historical Provider, MD    Aspirin-Salicylamide-Caffeine (BC HEADACHE POWDER PO) Take 1 Package by mouth daily.   Yes Historical Provider, MD  cyclobenzaprine (FLEXERIL) 10 MG tablet Take 1 tablet (10 mg total) by mouth 2 (two) times daily as needed for muscle spasms. Patient not taking: Reported on 01/27/2016 12/30/15   Konrad Felix, PA  diclofenac (CATAFLAM) 50 MG tablet Take 1 tablet (50 mg total) by mouth 3 (three) times daily. Patient not taking: Reported on 01/27/2016 12/30/15   Konrad Felix, PA    Physical Exam: Vitals:   01/27/16 1930 01/27/16 2030 01/27/16 2100 01/27/16 2155  BP: 121/79 123/84 117/80 118/70  Pulse: 85 89 90 71  Resp: '18 18 18 18  '$ Temp:    98.4 F (36.9 C)  TempSrc:    Oral  SpO2: 96% 95% 95% 100%  Weight:    57.3 kg (126 lb 5.2 oz)  Height:    '5\' 6"'$  (1.676 m)      Constitutional: Moderately built and poorly nourished. Vitals:   01/27/16 1930 01/27/16 2030 01/27/16 2100 01/27/16 2155  BP: 121/79 123/84 117/80 118/70  Pulse: 85 89 90 71  Resp: '18 18 18 18  '$ Temp:    98.4 F (36.9 C)  TempSrc:    Oral  SpO2: 96% 95% 95% 100%  Weight:    57.3 kg (126 lb 5.2 oz)  Height:    '5\' 6"'$  (1.676 m)   Eyes: Anicteric mild pallor. ENMT: No discharge from the ears eyes nose or mouth.  Neck: No mass felt. No neck rigidity. No JVD appreciated. Respiratory: No rhonchi or crepitations. Cardiovascular: S1 and S2 heard. No murmurs appreciated. Abdomen: Soft nontender bowel sounds present. No guarding or rigidity. Musculoskeletal: Poor pulses in the lower extremity. Skin: No rash. Skin appears warm. Neurologic: Alert awake oriented to time place and person. Moves all extremities. Psychiatric: Appears normal.   Labs on Admission: I have personally reviewed following labs and imaging studies  CBC:  Recent Labs Lab 01/27/16 1232  WBC 17.0*  NEUTROABS 12.6*  HGB 7.4*  HCT 23.9*  MCV 65.3*  PLT 016*   Basic Metabolic Panel:  Recent Labs Lab 01/27/16 1232  NA 135  K 4.6   CL 106  CO2 22  GLUCOSE 91  BUN 9  CREATININE 0.66  CALCIUM 8.5*   GFR: Estimated Creatinine Clearance: 84.6 mL/min (by C-G formula based on SCr of 0.66 mg/dL). Liver Function Tests:  Recent Labs Lab 01/27/16 1232  AST 20  ALT 13*  ALKPHOS 289*  BILITOT 0.2*  PROT 7.9  ALBUMIN 1.9*   No results for input(s): LIPASE, AMYLASE in the last 168 hours. No results for input(s): AMMONIA in the last 168 hours. Coagulation Profile: No results for input(s): INR, PROTIME in the last 168 hours. Cardiac Enzymes: No results for input(s): CKTOTAL, CKMB, CKMBINDEX, TROPONINI in the last 168 hours. BNP (last 3 results) No results for input(s): PROBNP in the last 8760 hours. HbA1C: No results for input(s): HGBA1C in the last 72 hours. CBG: No results for input(s): GLUCAP in the last 168 hours. Lipid Profile: No results for input(s): CHOL, HDL, LDLCALC, TRIG, CHOLHDL, LDLDIRECT in the last 72 hours. Thyroid Function Tests: No results for input(s): TSH, T4TOTAL, FREET4, T3FREE, THYROIDAB in the last 72 hours. Anemia Panel: No results for input(s): VITAMINB12, FOLATE, FERRITIN, TIBC, IRON, RETICCTPCT in the last 72 hours. Urine analysis:    Component Value Date/Time   COLORURINE YELLOW 01/27/2016 [place] 01/27/2016 1246   LABSPEC 1.022 01/27/2016 1246   PHURINE 6.0 01/27/2016 1246   GLUCOSEU NEGATIVE 01/27/2016 1246   HGBUR NEGATIVE 01/27/2016 [place] 01/27/2016 [place] 01/27/2016 1246   PROTEINUR NEGATIVE 01/27/2016 1246   NITRITE NEGATIVE 01/27/2016 1246   LEUKOCYTESUR NEGATIVE 01/27/2016 1246   Sepsis Labs: '@LABRCNTIP'$ (procalcitonin:4,lacticidven:4) )No results found for this or any previous visit (from the past 240 hour(s)).   Radiological Exams on Admission: Dg Chest 2 View  Result Date: 01/27/2016 CLINICAL DATA:  Intermittent left lower extremity pain after the last year. 30 pound weight loss over the last 2  months. EXAM: CHEST  2 VIEW COMPARISON:  Chest CT 06/16/2007. FINDINGS: Heart size is normal. Mediastinal shadows are normal. Left lung is clear. There is consolidation and collapse in the right lower lobe. Right upper lobe is clear. Some volume loss in the right middle lobe. There is some pleural density on the right laterally. IMPRESSION: Consolidation and volume loss in the right lower lobe. Mild volume loss in the right middle lobe. Pleural density on the right laterally. Findings could be due to chronic pneumonia with volume loss, but the possibility of malignant disease does exist. Chest CT with contrast suggested when able. Electronically Signed   By: Nelson Chimes M.D.   On: 01/27/2016 12:59   Ct Angio Chest/abd/pel For Dissection W And/or W/wo  Result Date: 01/27/2016 CLINICAL DATA:  Intermittent LEFT lower extremity pain for 1 year and intermittent foot numbness. 30 pound weight loss in  2 months. Follow-up possible chest malignancy. EXAM: CT ANGIOGRAPHY CHEST, ABDOMEN AND PELVIS TECHNIQUE: Multidetector CT imaging through the chest, abdomen and pelvis was performed using the standard protocol during bolus administration of intravenous contrast. Multiplanar reconstructed images and MIPs were obtained and reviewed to evaluate the vascular anatomy. CONTRAST:  80 cc Isovue 370 COMPARISON:  Chest radiograph January 27, 2016 at 1238 hours FINDINGS: CTA CHEST FINDINGS CARDIOVASCULAR: Thoracic aorta is normal course and caliber. No intrinsic density on noncontrast CT. Homogeneous contrast opacification of thoracic aorta without dissection, aneurysm, luminal irregularity, periaortic fluid collections, or contrast extravasation. Trace coronary artery calcifications. Heart size is normal. No pericardial effusion. MEDIASTINUM/NODES: 2.5 mm sub carinal nodal conglomeration. 12 mm RIGHT anterior mediastinal/ prevascular necrotic lymph node. RIGHT hilar lymphadenopathy contiguous with RIGHT lung mass. LUNGS/PLEURA:  9.1 x 13.2 x 13.9 cm RIGHT lung base mass contiguous with the RIGHT hilum, and is contiguous with the diaphragm and pleura. Effaced RIGHT lower lobe bronchi. RIGHT hilar lymphadenopathy narrows the RIGHT middle lobe bronchi. Mass encases the RIGHT lower lobe pulmonary arteries which are patent. No chest wall invasion. Patchy ground-glass nodules and opacities RIGHT lower lobe. LEFT lung base dependent atelectasis. Mild centrilobular emphysema. 3 mm RIGHT middle lobe pulmonary nodule versus granuloma. MUSCULOSKELETAL: Nonsuspicious. Sub cm LEFT thyroid nodule below size followup recommendations. Review of the MIP images confirms the above findings. CTA ABDOMEN AND PELVIS FINDINGS ARTERIES: Abdominal aorta is normal course and caliber. Mild calcific atherosclerosis. 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 Reconstitution of distal LEFT external iliac artery. Reconstitution of distal LEFT internal iliac arteries. Homogeneous contrast opacification of the aorta vessels without dissection, aneurysm, luminal irregularity, periaortic fluid collections, or contrast extravasation. Celiac axis, superior and inferior mesenteric arteries are patent. Moderate stenosis inferior mesenteric artery origin. HEPATOBILIARY: Liver and gallbladder are normal. PANCREAS: Normal. SPLEEN: Normal. ADRENALS/URINARY TRACT: Kidneys are orthotopic, demonstrating symmetric enhancement. No nephrolithiasis, hydronephrosis or solid renal masses. 4.8 cm RIGHT interpolar benign-appearing cyst. The unopacified ureters are normal in course and caliber. Urinary bladder is well distended and unremarkable. Normal adrenal glands. STOMACH/BOWEL: The stomach, small and large bowel are normal in course and caliber without inflammatory changes decreased sensitivity without enteric contrast. Normal appendix. VASCULAR/LYMPHATIC: No lymphadenopathy by CT size criteria. REPRODUCTIVE: Normal. OTHER: No intraperitoneal  free fluid or free air. MUSCULOSKELETAL: Nonacute. Bridging sacroiliac osteophytes. Nondisplaced RIGHT L3, L4 healing transverse process fractures. Review of the MIP images confirms the above findings. IMPRESSION: CTA CHEST: 9 x 1 x 13.2 x 13.9 cm RIGHT lung mass contiguous with the hilum compatible with neoplasm, primary lung versus lymphoma. Mediastinal lymphadenopathy. RIGHT lower lobe probable postobstructive pneumonia. Small RIGHT pleural effusion. Moderate emphysema. No acute vascular process. 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 Reconstitution distal LEFT internal external iliac arteries. Mild atherosclerosis. Acute to subacute nondisplaced RIGHT L3 and L4 transverse process fractures appear traumatic, not pathologic. Acute findings discussed with and reconfirmed by Dr.WARREN JONES on 01/27/2016 at 5:45 pm. Electronically Signed   By: Elon Alas M.D.   On: 01/27/2016 17:50    Assessment/Plan Principal Problem:   Mass of right lung Active Problems:   Leg pain, inferior, left   Microcytic hypochromic anemia   Lung mass    1. Right lung mass concerning for neoplasm - discussed with pulmonary critical care who will be seeing patient in consult for possible biopsy. 2. Microcytic hypochromic anemia - patient never had colonoscopy done previously. Check anemia panel follow  CBC. Patient eventually will need colonoscopy. 3. Left leg pain with occlusion of the left common iliac artery - arrange follow-up with vascular surgeon at discharge as requested by on-call vascular surgeon. 4. Tobacco abuse - patient advised to quit smoking. 5. Alcohol Abuse - on CIWA.   DVT prophylaxis: SCDs. Code Status: Full code.  Family Communication: Patient's wife.  Disposition Plan: Home.  Consults called: Pulmonary critical care.  Admission status: Observation.    Rise Patience MD [facility] Pager [phone].  If 7PM-7AM, please  contact night-coverage [email or web address] Password [email or web address]  01/27/2016, 11:41 PM

## 2016-01-27 NOTE — Progress Notes (Signed)
Patient/Family oriented to room. Information packet given to patient/family. Admission inpatient armband information verified with patient/family to include name and date of birth and placed on patient arm. Side rails up x 2, fall assessment and education completed with patient/family. Call light within reach. Patient/family able to voice and demonstrate understanding of unit orientation instructions  

## 2016-01-27 NOTE — Progress Notes (Addendum)
VASCULAR LAB PRELIMINARY  LEFT ARTERIAL DUPLEX- Monophasic flow noted throughout left lower extremity, suggesting a more proximal obstruction. No focal areas of stenosis or occlusion.  ABI completed: Right ABI is within normal limits. Left ABI indicates severe arterial disease.    RIGHT    LEFT    PRESSURE WAVEFORM  PRESSURE WAVEFORM  BRACHIAL 127 Tri BRACHIAL 127 Tri  AT 129 Tri AT 67 [place]   PT 124 Tri PT 62 [place]    RIGHT LEFT  ABI 1.02 0.53    Landry Mellow, RDMS, RVT   01/27/2016, 2:45 PM

## 2016-01-28 DIAGNOSIS — M79609 Pain in unspecified limb: Secondary | ICD-10-CM | POA: Diagnosis not present

## 2016-01-28 DIAGNOSIS — Z8249 Family history of ischemic heart disease and other diseases of the circulatory system: Secondary | ICD-10-CM | POA: Diagnosis not present

## 2016-01-28 DIAGNOSIS — I70212 Atherosclerosis of native arteries of extremities with intermittent claudication, left leg: Secondary | ICD-10-CM

## 2016-01-28 DIAGNOSIS — E43 Unspecified severe protein-calorie malnutrition: Secondary | ICD-10-CM | POA: Diagnosis not present

## 2016-01-28 DIAGNOSIS — Z6820 Body mass index (BMI) 20.0-20.9, adult: Secondary | ICD-10-CM | POA: Diagnosis not present

## 2016-01-28 DIAGNOSIS — Z720 Tobacco use: Secondary | ICD-10-CM

## 2016-01-28 DIAGNOSIS — F17210 Nicotine dependence, cigarettes, uncomplicated: Secondary | ICD-10-CM | POA: Diagnosis present

## 2016-01-28 DIAGNOSIS — L989 Disorder of the skin and subcutaneous tissue, unspecified: Secondary | ICD-10-CM | POA: Diagnosis not present

## 2016-01-28 DIAGNOSIS — M79605 Pain in left leg: Secondary | ICD-10-CM | POA: Diagnosis present

## 2016-01-28 DIAGNOSIS — I745 Embolism and thrombosis of iliac artery: Secondary | ICD-10-CM | POA: Diagnosis present

## 2016-01-28 DIAGNOSIS — R509 Fever, unspecified: Secondary | ICD-10-CM | POA: Diagnosis present

## 2016-01-28 DIAGNOSIS — J90 Pleural effusion, not elsewhere classified: Secondary | ICD-10-CM | POA: Diagnosis present

## 2016-01-28 DIAGNOSIS — D509 Iron deficiency anemia, unspecified: Secondary | ICD-10-CM | POA: Diagnosis not present

## 2016-01-28 DIAGNOSIS — X58XXXA Exposure to other specified factors, initial encounter: Secondary | ICD-10-CM | POA: Diagnosis present

## 2016-01-28 DIAGNOSIS — Z7982 Long term (current) use of aspirin: Secondary | ICD-10-CM | POA: Diagnosis not present

## 2016-01-28 DIAGNOSIS — Z79899 Other long term (current) drug therapy: Secondary | ICD-10-CM | POA: Diagnosis not present

## 2016-01-28 DIAGNOSIS — S32038A Other fracture of third lumbar vertebra, initial encounter for closed fracture: Secondary | ICD-10-CM | POA: Diagnosis present

## 2016-01-28 DIAGNOSIS — R42 Dizziness and giddiness: Secondary | ICD-10-CM | POA: Diagnosis not present

## 2016-01-28 DIAGNOSIS — I2510 Atherosclerotic heart disease of native coronary artery without angina pectoris: Secondary | ICD-10-CM | POA: Diagnosis present

## 2016-01-28 DIAGNOSIS — S50311A Abrasion of right elbow, initial encounter: Secondary | ICD-10-CM | POA: Diagnosis present

## 2016-01-28 DIAGNOSIS — S32009K Unspecified fracture of unspecified lumbar vertebra, subsequent encounter for fracture with nonunion: Secondary | ICD-10-CM | POA: Diagnosis not present

## 2016-01-28 DIAGNOSIS — R918 Other nonspecific abnormal finding of lung field: Secondary | ICD-10-CM

## 2016-01-28 DIAGNOSIS — S32048A Other fracture of fourth lumbar vertebra, initial encounter for closed fracture: Secondary | ICD-10-CM | POA: Diagnosis present

## 2016-01-28 DIAGNOSIS — Y92239 Unspecified place in hospital as the place of occurrence of the external cause: Secondary | ICD-10-CM | POA: Diagnosis present

## 2016-01-28 DIAGNOSIS — J189 Pneumonia, unspecified organism: Secondary | ICD-10-CM | POA: Diagnosis present

## 2016-01-28 DIAGNOSIS — C3491 Malignant neoplasm of unspecified part of right bronchus or lung: Secondary | ICD-10-CM | POA: Diagnosis present

## 2016-01-28 DIAGNOSIS — F1010 Alcohol abuse, uncomplicated: Secondary | ICD-10-CM | POA: Diagnosis not present

## 2016-01-28 DIAGNOSIS — Z9889 Other specified postprocedural states: Secondary | ICD-10-CM | POA: Diagnosis not present

## 2016-01-28 DIAGNOSIS — R590 Localized enlarged lymph nodes: Secondary | ICD-10-CM | POA: Diagnosis present

## 2016-01-28 DIAGNOSIS — R634 Abnormal weight loss: Secondary | ICD-10-CM | POA: Diagnosis present

## 2016-01-28 DIAGNOSIS — W19XXXA Unspecified fall, initial encounter: Secondary | ICD-10-CM | POA: Diagnosis present

## 2016-01-28 DIAGNOSIS — S80812A Abrasion, left lower leg, initial encounter: Secondary | ICD-10-CM | POA: Diagnosis present

## 2016-01-28 DIAGNOSIS — D638 Anemia in other chronic diseases classified elsewhere: Secondary | ICD-10-CM | POA: Diagnosis present

## 2016-01-28 DIAGNOSIS — J439 Emphysema, unspecified: Secondary | ICD-10-CM | POA: Diagnosis present

## 2016-01-28 DIAGNOSIS — I739 Peripheral vascular disease, unspecified: Secondary | ICD-10-CM | POA: Diagnosis not present

## 2016-01-28 DIAGNOSIS — D473 Essential (hemorrhagic) thrombocythemia: Secondary | ICD-10-CM | POA: Diagnosis present

## 2016-01-28 LAB — CBC WITH DIFFERENTIAL/PLATELET
BASOS ABS: 0 10*3/uL (ref 0.0–0.1)
Basophils Relative: 0 %
EOS PCT: 1 %
Eosinophils Absolute: 0.2 10*3/uL (ref 0.0–0.7)
HCT: 23.3 % — ABNORMAL LOW (ref 39.0–52.0)
Hemoglobin: 7 g/dL — ABNORMAL LOW (ref 13.0–17.0)
LYMPHS ABS: 1.8 10*3/uL (ref 0.7–4.0)
Lymphocytes Relative: 11 %
MCH: 19.5 pg — ABNORMAL LOW (ref 26.0–34.0)
MCHC: 30 g/dL (ref 30.0–36.0)
MCV: 64.9 fL — AB (ref 78.0–100.0)
MONO ABS: 1.7 10*3/uL — AB (ref 0.1–1.0)
Monocytes Relative: 10 %
NEUTROS ABS: 12.9 10*3/uL — AB (ref 1.7–7.7)
Neutrophils Relative %: 78 %
PLATELETS: 760 10*3/uL — AB (ref 150–400)
RBC: 3.59 MIL/uL — AB (ref 4.22–5.81)
RDW: 17.4 % — AB (ref 11.5–15.5)
WBC: 16.6 10*3/uL — AB (ref 4.0–10.5)

## 2016-01-28 LAB — VITAMIN B12: Vitamin B-12: 211 pg/mL (ref 180–914)

## 2016-01-28 LAB — CBC
HCT: 24.9 % — ABNORMAL LOW (ref 39.0–52.0)
Hemoglobin: 7.8 g/dL — ABNORMAL LOW (ref 13.0–17.0)
MCH: 20.8 pg — ABNORMAL LOW (ref 26.0–34.0)
MCHC: 31.3 g/dL (ref 30.0–36.0)
MCV: 66.4 fL — AB (ref 78.0–100.0)
PLATELETS: 720 10*3/uL — AB (ref 150–400)
RBC: 3.75 MIL/uL — ABNORMAL LOW (ref 4.22–5.81)
RDW: 18.5 % — AB (ref 11.5–15.5)
WBC: 17.3 10*3/uL — AB (ref 4.0–10.5)

## 2016-01-28 LAB — COMPREHENSIVE METABOLIC PANEL
ALT: 10 U/L — AB (ref 17–63)
AST: 15 U/L (ref 15–41)
Albumin: 1.9 g/dL — ABNORMAL LOW (ref 3.5–5.0)
Alkaline Phosphatase: 283 U/L — ABNORMAL HIGH (ref 38–126)
Anion gap: 9 (ref 5–15)
BILIRUBIN TOTAL: 0.3 mg/dL (ref 0.3–1.2)
BUN: 7 mg/dL (ref 6–20)
CO2: 22 mmol/L (ref 22–32)
CREATININE: 0.59 mg/dL — AB (ref 0.61–1.24)
Calcium: 8.6 mg/dL — ABNORMAL LOW (ref 8.9–10.3)
Chloride: 102 mmol/L (ref 101–111)
GFR calc Af Amer: >60 mL/min (ref >60)
GLUCOSE: 87 mg/dL (ref 65–99)
Potassium: 4.3 mmol/L (ref 3.5–5.1)
Sodium: 133 mmol/L — ABNORMAL LOW (ref 135–145)
TOTAL PROTEIN: 7.8 g/dL (ref 6.5–8.1)

## 2016-01-28 LAB — RETICULOCYTES
RBC.: 3.56 MIL/uL — ABNORMAL LOW (ref 4.22–5.81)
Retic Count, Absolute: 42.7 10*3/uL (ref 19.0–186.0)
Retic Ct Pct: 1.2 % (ref 0.4–3.1)

## 2016-01-28 LAB — PREPARE RBC (CROSSMATCH)

## 2016-01-28 LAB — PROTIME-INR
INR: 1.35
Prothrombin Time: 16.8 seconds — ABNORMAL HIGH (ref 11.4–15.2)

## 2016-01-28 LAB — IRON AND TIBC
IRON: 7 ug/dL — AB (ref 45–182)
SATURATION RATIOS: 4 % — AB (ref 17.9–39.5)
TIBC: 176 ug/dL — AB (ref 250–450)
UIBC: 169 ug/dL

## 2016-01-28 LAB — ABO/RH: ABO/RH(D): A NEG

## 2016-01-28 LAB — FERRITIN: Ferritin: 651 ng/mL — ABNORMAL HIGH (ref 24–336)

## 2016-01-28 LAB — FOLATE: FOLATE: 7.1 ng/mL (ref >5.9)

## 2016-01-28 MED ORDER — DIPHENHYDRAMINE HCL 50 MG/ML IJ SOLN
25.0000 mg | Freq: Once | INTRAMUSCULAR | Status: AC
  Administered 2016-01-28: 25 mg via INTRAVENOUS
  Filled 2016-01-28: qty 1

## 2016-01-28 MED ORDER — FUROSEMIDE 10 MG/ML IJ SOLN
20.0000 mg | Freq: Once | INTRAMUSCULAR | Status: AC
  Administered 2016-01-28: 20 mg via INTRAVENOUS
  Filled 2016-01-28: qty 2

## 2016-01-28 MED ORDER — IPRATROPIUM-ALBUTEROL 0.5-2.5 (3) MG/3ML IN SOLN
3.0000 mL | Freq: Two times a day (BID) | RESPIRATORY_TRACT | Status: DC
Start: 2016-01-28 — End: 2016-01-30
  Administered 2016-01-28 – 2016-01-30 (×4): 3 mL via RESPIRATORY_TRACT
  Filled 2016-01-28 (×4): qty 3

## 2016-01-28 MED ORDER — IPRATROPIUM-ALBUTEROL 0.5-2.5 (3) MG/3ML IN SOLN
3.0000 mL | Freq: Four times a day (QID) | RESPIRATORY_TRACT | Status: DC
  Administered 2016-01-28 (×2): 3 mL via RESPIRATORY_TRACT
  Filled 2016-01-28 (×2): qty 3

## 2016-01-28 MED ORDER — ACETAMINOPHEN 325 MG PO TABS
650.0000 mg | ORAL_TABLET | Freq: Once | ORAL | Status: AC
  Administered 2016-01-28: 650 mg via ORAL
  Filled 2016-01-28: qty 2

## 2016-01-28 MED ORDER — ALBUTEROL SULFATE (2.5 MG/3ML) 0.083% IN NEBU: 2.5000 mg | INHALATION_SOLUTION | RESPIRATORY_TRACT | Status: DC | PRN

## 2016-01-28 MED ORDER — SODIUM CHLORIDE 0.9 % IV SOLN
Freq: Once | INTRAVENOUS | Status: AC
Start: 2016-01-28 — End: 2016-01-28
  Administered 2016-01-28: 12:00:00 via INTRAVENOUS

## 2016-01-28 MED ORDER — ENSURE ENLIVE PO LIQD
237.0000 mL | Freq: Two times a day (BID) | ORAL | Status: DC
  Administered 2016-01-28 – 2016-02-06 (×18): 237 mL via ORAL
  Filled 2016-01-28: qty 237

## 2016-01-28 NOTE — Consult Note (Signed)
Name: [name] Chapman: [id number] DOB: 1960-04-02    ADMISSION DATE:  01/27/2016 CONSULTATION DATE:  01/27/16  REFERRING MD :  Hal Hope  CHIEF COMPLAINT:  L leg pain   HISTORY OF PRESENT ILLNESS:  [name] Chapman is a 55 y.o. male with a significant past medical history.  He presented to [facility] ED on 01/27/16 with left LE pain. He states that symptoms began one year ago and gradually began to get worse. Now he has got to the point where he cannot even walk up a flight of stairs and even walking on flat surfaces, he has to pause to take breaks in order to get relief. He has been using Tylenol and BC powder without much relief. He has never had similar symptoms in the past. He is fairly active at baseline and he works in a [employer]. He has not had any numbness or tingling in the foot, simply burning pain.  Upon further questioning, he also reports a roughly 30 pound weight loss in the past 2 months. This has been unintentional. He has had normal appetite has been eating and drinking normally. He is a current every day smoker, smoked roughly 1 pack per day and has been for the past 30+years.  He has not noted any changes to his stools and he has not had any screening colonoscopy.  In ED, he had ABI which demonstrated normal findings on the right however severe arterial disease on the left. CXR revealed RLL opacity versus mass. This was followed up by CTA of the chest which demonstrated a large 9 x 13.2 x 13.9 cm right lower lobe lung mass was contiguous with the hilum, along with mediastinal lymphadenopathy and small right pleural effusion. PCCM was asked to see for consideration of FOB. He denies any chest pain, cough, SOB, hemoptysis.  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  PAST MEDICAL HISTORY :   has no past medical history on file.  has no past  surgical history on file. Prior to Admission medications   Medication Sig Start Date End Date Taking? Authorizing Provider  acetaminophen (TYLENOL) 500 MG tablet Take 1,000 mg by mouth every 6 (six) hours as needed.   Yes Historical Provider, MD  Aspirin-Salicylamide-Caffeine (BC HEADACHE POWDER PO) Take 1 Package by mouth daily.   Yes Historical Provider, MD  cyclobenzaprine (FLEXERIL) 10 MG tablet Take 1 tablet (10 mg total) by mouth 2 (two) times daily as needed for muscle spasms. Patient not taking: Reported on 01/27/2016 12/30/15   Konrad Felix, PA  diclofenac (CATAFLAM) 50 MG tablet Take 1 tablet (50 mg total) by mouth 3 (three) times daily. Patient not taking: Reported on 01/27/2016 12/30/15   Konrad Felix, PA   No Known Allergies  FAMILY HISTORY:  family history includes CAD in his father. SOCIAL HISTORY:  reports that he has been smoking.  He has been smoking about 1.00 pack per day. He has never used smokeless tobacco. He reports that he drinks about 3.6 oz of alcohol per week . He reports that he does not use drugs.  REVIEW OF SYSTEMS:   All negative; except for those that are bolded, which indicate positives.  Constitutional: weight loss, weight gain, night sweats, fevers, chills, fatigue, weakness.  HEENT: headaches, sore throat, sneezing, nasal congestion, post nasal drip, difficulty swallowing, tooth/dental problems, visual complaints, visual changes, ear  aches. Neuro: difficulty with speech, weakness, numbness, ataxia. CV:  chest pain, orthopnea, PND, swelling in lower extremities, dizziness, palpitations, syncope.  Resp: cough, hemoptysis, dyspnea, wheezing. GI: heartburn, indigestion, abdominal pain, nausea, vomiting, diarrhea, constipation, change in bowel habits, loss of appetite, hematemesis, melena, hematochezia.  GU: dysuria, change in color of urine, urgency or frequency, flank pain, hematuria. MSK: joint pain or swelling, decreased range of motion, LLE  pain. Psych: change in mood or affect, depression, anxiety, suicidal ideations, homicidal ideations. Skin: rash, itching, bruising.    SUBJECTIVE:  Denies any cough, SOB.  Only complaint is LLE with exertion.  VITAL SIGNS: Temp:  [97.7 F (36.5 C)-98.4 F (36.9 C)] 98.4 F (36.9 C) (11/15 2155) Pulse Rate:  [71-96] 71 (11/15 2155) Resp:  [18-20] 18 (11/15 2155) BP: (115-132)/(70-88) 118/70 (11/15 2155) SpO2:  [95 %-100 %] 100 % (11/15 2155) Weight:  [126 lb 5.2 oz (57.3 kg)] 126 lb 5.2 oz (57.3 kg) (11/15 2155)  PHYSICAL EXAMINATION: General: Middle aged male, resting in bed, in NAD. Neuro: A&O x 3, non-focal.  HEENT: [place]/AT. PERRL, sclerae anicteric. Cardiovascular: RRR, no M/R/G.  Lungs: Respirations unlabored.  Clear bilaterally, slightly diminished in right base. Abdomen: BS x 4, soft, NT/ND.  Musculoskeletal: No gross deformities, no edema.  Skin: Intact, warm, no rashes.     Recent Labs Lab 01/27/16 1232  NA 135  K 4.6  CL 106  CO2 22  BUN 9  CREATININE 0.66  GLUCOSE 91    Recent Labs Lab 01/27/16 1232  HGB 7.4*  HCT 23.9*  WBC 17.0*  PLT 737*   Dg Chest 2 View  Result Date: 01/27/2016 CLINICAL DATA:  Intermittent left lower extremity pain after the last year. 30 pound weight loss over the last 2 months. EXAM: CHEST  2 VIEW COMPARISON:  Chest CT 06/16/2007. FINDINGS: Heart size is normal. Mediastinal shadows are normal. Left lung is clear. There is consolidation and collapse in the right lower lobe. Right upper lobe is clear. Some volume loss in the right middle lobe. There is some pleural density on the right laterally. IMPRESSION: Consolidation and volume loss in the right lower lobe. Mild volume loss in the right middle lobe. Pleural density on the right laterally. Findings could be due to chronic pneumonia with volume loss, but the possibility of malignant disease does exist. Chest CT with contrast suggested when able. Electronically Signed   By: Nelson Chimes M.D.   On: 01/27/2016 12:59   Ct Angio Chest/abd/pel For Dissection W And/or W/wo  Result Date: 01/27/2016 CLINICAL DATA:  Intermittent LEFT lower extremity pain for 1 year and intermittent foot numbness. 30 pound weight loss in 2 months. Follow-up possible chest malignancy. EXAM: CT ANGIOGRAPHY CHEST, ABDOMEN AND PELVIS TECHNIQUE: Multidetector CT imaging through the chest, abdomen and pelvis was performed using the standard protocol during bolus administration of intravenous contrast. Multiplanar reconstructed images and MIPs were obtained and reviewed to evaluate the vascular anatomy. CONTRAST:  80 cc Isovue 370 COMPARISON:  Chest radiograph January 27, 2016 at 1238 hours FINDINGS: CTA CHEST FINDINGS CARDIOVASCULAR: Thoracic aorta is normal course and caliber. No intrinsic density on noncontrast CT. Homogeneous contrast opacification of thoracic aorta without dissection, aneurysm, luminal irregularity, periaortic fluid collections, or contrast extravasation. Trace coronary artery calcifications. Heart size is normal. No pericardial effusion. MEDIASTINUM/NODES: 2.5 mm sub carinal nodal conglomeration. 12 mm RIGHT anterior mediastinal/ prevascular necrotic lymph node. RIGHT hilar lymphadenopathy contiguous with RIGHT lung mass. LUNGS/PLEURA: 9.1 x 13.2 x 13.9 cm RIGHT  lung base mass contiguous with the RIGHT hilum, and is contiguous with the diaphragm and pleura. Effaced RIGHT lower lobe bronchi. RIGHT hilar lymphadenopathy narrows the RIGHT middle lobe bronchi. Mass encases the RIGHT lower lobe pulmonary arteries which are patent. No chest wall invasion. Patchy ground-glass nodules and opacities RIGHT lower lobe. LEFT lung base dependent atelectasis. Mild centrilobular emphysema. 3 mm RIGHT middle lobe pulmonary nodule versus granuloma. MUSCULOSKELETAL: Nonsuspicious. Sub cm LEFT thyroid nodule below size followup recommendations. Review of the MIP images confirms the above findings. CTA ABDOMEN AND  PELVIS FINDINGS ARTERIES: Abdominal aorta is normal course and caliber. Mild calcific atherosclerosis. 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 Reconstitution of distal LEFT external iliac artery. Reconstitution of distal LEFT internal iliac arteries. Homogeneous contrast opacification of the aorta vessels without dissection, aneurysm, luminal irregularity, periaortic fluid collections, or contrast extravasation. Celiac axis, superior and inferior mesenteric arteries are patent. Moderate stenosis inferior mesenteric artery origin. HEPATOBILIARY: Liver and gallbladder are normal. PANCREAS: Normal. SPLEEN: Normal. ADRENALS/URINARY TRACT: Kidneys are orthotopic, demonstrating symmetric enhancement. No nephrolithiasis, hydronephrosis or solid renal masses. 4.8 cm RIGHT interpolar benign-appearing cyst. The unopacified ureters are normal in course and caliber. Urinary bladder is well distended and unremarkable. Normal adrenal glands. STOMACH/BOWEL: The stomach, small and large bowel are normal in course and caliber without inflammatory changes decreased sensitivity without enteric contrast. Normal appendix. VASCULAR/LYMPHATIC: No lymphadenopathy by CT size criteria. REPRODUCTIVE: Normal. OTHER: No intraperitoneal free fluid or free air. MUSCULOSKELETAL: Nonacute. Bridging sacroiliac osteophytes. Nondisplaced RIGHT L3, L4 healing transverse process fractures. Review of the MIP images confirms the above findings. IMPRESSION: CTA CHEST: 9 x 1 x 13.2 x 13.9 cm RIGHT lung mass contiguous with the hilum compatible with neoplasm, primary lung versus lymphoma. Mediastinal lymphadenopathy. RIGHT lower lobe probable postobstructive pneumonia. Small RIGHT pleural effusion. Moderate emphysema. No acute vascular process. 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 Reconstitution distal LEFT internal external iliac arteries. Mild  atherosclerosis. Acute to subacute nondisplaced RIGHT L3 and L4 transverse process fractures appear traumatic, not pathologic. Acute findings discussed with and reconfirmed by Dr.WARREN JONES on 01/27/2016 at 5:45 pm. Electronically Signed   By: Elon Alas M.D.   On: 01/27/2016 17:50    STUDIES:  CTA chest 11/15 > large 9 x 13.2 x 13.9 cm right lower lobe lung mass was contiguous with the hilum compatible with neoplasm, primary lung versus lymphoma.  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 ABI 11/15 > normal findings on the right however severe arterial disease on the left.  SIGNIFICANT EVENTS  11/15 >admitted with left leg pain due to severe peripheral arterial disease. Additionally, had incidental finding of a large right lung mass.  ASSESSMENT / PLAN:  Large right lower lobe lung mass - incidental finding in a patient with roughly 30-pack-year smoking history along with his history of unintentional weight loss, making this highly suspicious for malignancy. Mild emphysema. Plan: NPO after midnight. Coags in AM. Day team to see and consider FOB with washings and possible biopsy or tissue sampling - although CT guided bx may be less invasive and have higher yield. Will likely also need further imaging for staging purposes. Will likely need oncology consult. DuoNebs / Albuterol PRN.   Rest per primary team.    Montey Hora, [facility] Pager: [phone]  or [phone]  [phone] 01/28/2016, 12:23 AM

## 2016-01-28 NOTE — Progress Notes (Signed)
Patient ID: [name] Chapman, male   DOB: 10/16/1960, 55 y.o.   MRN: [id number] Request received for possible rt lung mass biopsy on pt. Imaging studies were reviewed by Dr. Pascal Lux. Majority of lung mass appears necrotic so percutaneous biopsy may not yield adequate tissue for analysis. Recommend proceeding with bronchoscopy initially since more viable tissue may be obtained this route; if difficulty encountered or tissue insufficient then can re evaluate for needle bx. Please page Dr. Pascal Lux at [phone] with any additional questions.

## 2016-01-28 NOTE — Progress Notes (Addendum)
PROGRESS NOTE        PATIENT DETAILS Name: [name] Chapman Age: 55 y.o. Sex: male Date of Birth: 1961/02/11 Admit Date: 01/27/2016 Admitting Physician Rise Patience, MD PCP:No PCP Per Patient  Brief Narrative: Patient is a 55 y.o. male with long-standing history of tobacco use, presented to the hospital for evaluation of claudication pain involving his left lower extremity, this was evaluated by a CT angiogram of the chest, abdomen/pelvis which demonstrated a large right lung mass. Patient was subsequently admitted to the hospital for further evaluation and treatment. See below for further details.   Subjective: No left leg pain at rest.  Knowledge his significant weight loss over the past few months. No chest pain or shortness of breath. No hemoptysis.  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 Seen by PCCM-who recommended that we touch base with interventional radiology-who has reviewed the images and feels that proceeding with bronchoscopy would have the highest yield. We will reconsult pulmonology today.  Peripheral vascular disease with left leg claudication pain: Patient has had claudication involving his left lower extremity for approximately 1 year, he can now only walk approximately 100 feet before he starts having pain. CT angiogram confirms complete occlusion of left common iliac artery with distal reconstitution of the left internal and external iliac arteries, await vascular surgery evaluation. Please note both of his lower extremities appear warm to touch.  Right L3/L4 transverse process fracture: Supportive care-no significant trauma history-? Pathologic fracture.  Leukocytosis: CT chest also shows possible postobstructive pneumonia, we'll go ahead and start Unasyn. Follow.  Anemia: No history of obvious blood loss-high suspicion for anemia due to malignancy. Given hemoglobin now down  to 7-we will go ahead and transfuse 1 unit of PRBC as recommended by oncology. Follow.  Tobacco abuse: Counseled, continue transdermal nicotine  EtOH use: Drinks approximately 40 ounces of beer daily-last drink on 11/15-no signs of withdrawal at present. Continue Ativan per protocol  Severe protein calorie malnutrition: Continue supplements   DVT Prophylaxis: SCD's  Code Status: Full code   Family Communication: None at bedside  Disposition Plan: Remain inpatient-suspect requires several more days of hospitalization prior to d/c  Antimicrobial agents: See below  Procedures: None  CONSULTS:  pulmonary/intensive care, hematology/oncology and IR  Time spent: 25 minutes-Greater than 50% of this time was spent in counseling, explanation of diagnosis, planning of further management, and coordination of care.  MEDICATIONS: Anti-infectives    None      Scheduled Meds: . sodium chloride   Intravenous Once  . acetaminophen  650 mg Oral Once  . diphenhydrAMINE  25 mg Intravenous Once  . feeding supplement (ENSURE ENLIVE)  237 mL Oral BID BM  . folic acid  1 mg Oral Daily  . furosemide  20 mg Intravenous Once  . ipratropium-albuterol  3 mL Nebulization Q6H  . LORazepam  0-4 mg Intravenous Q6H   Followed by  . [START ON 01/29/2016] LORazepam  0-4 mg Intravenous Q12H  . multivitamin with minerals  1 tablet Oral Daily  . nicotine  14 mg Transdermal QHS  . thiamine  100 mg Oral Daily   Or  . thiamine  100 mg Intravenous Daily   Continuous Infusions: . sodium chloride 10 mL/hr at 01/27/16 2350   PRN Meds:.acetaminophen **OR** acetaminophen, albuterol, LORazepam **OR** LORazepam, ondansetron **OR** ondansetron (ZOFRAN)  IV   PHYSICAL EXAM: Vital signs: Vitals:   01/27/16 2155 01/28/16 0100 01/28/16 0535 01/28/16 0803  BP: 118/70  107/63   Pulse: 71  (!) 102   Resp: 18  18   Temp: 98.4 F (36.9 C)  98.7 F (37.1 C)   TempSrc: Oral  Oral   SpO2: 100% 98% 98% 94%    Weight: 57.3 kg (126 lb 5.2 oz)     Height: '5\' 6"'$  (1.676 m)      Filed Weights   01/27/16 2155  Weight: 57.3 kg (126 lb 5.2 oz)   Body mass index is 20.39 kg/m.   General appearance :Awake, alert, not in any distress. Cachectic and chronically ill-appearing Eyes:, pupils equally reactive to light and accomodation,no scleral icterus.Pink conjunctiva HEENT: Atraumatic and Normocephalic Neck: supple, no JVD. No cervical lymphadenopathy. Resp:Good air entry bilaterally, no added sounds  CVS: S1 S2 regular, no murmurs.  GI: Bowel sounds present, Non tender and not distended with no gaurding, rigidity or rebound. Extremities: B/L Lower Ext shows no edema, both legs are warm to touch Neurology:  speech clear,Non focal, sensation is grossly intact. Psychiatric: Normal judgment and insight. Alert and oriented x 3. Normal mood. Musculoskeletal:No digital cyanosis Skin:No Rash, warm and dry Wounds:N/A  I have personally reviewed following labs and imaging studies  LABORATORY DATA: CBC:  Recent Labs Lab 01/27/16 1232 01/28/16 0608  WBC 17.0* 16.6*  NEUTROABS 12.6* 12.9*  HGB 7.4* 7.0*  HCT 23.9* 23.3*  MCV 65.3* 64.9*  PLT 737* 760*    Basic Metabolic Panel:  Recent Labs Lab 01/27/16 1232 01/28/16 0608  NA 135 133*  K 4.6 4.3  CL 106 102  CO2 22 22  GLUCOSE 91 87  BUN 9 7  CREATININE 0.66 0.59*  CALCIUM 8.5* 8.6*    GFR: Estimated Creatinine Clearance: 84.6 mL/min (by C-G formula based on SCr of 0.59 mg/dL (L)).  Liver Function Tests:  Recent Labs Lab 01/27/16 1232 01/28/16 0608  AST 20 15  ALT 13* 10*  ALKPHOS 289* 283*  BILITOT 0.2* 0.3  PROT 7.9 7.8  ALBUMIN 1.9* 1.9*   No results for input(s): LIPASE, AMYLASE in the last 168 hours. No results for input(s): AMMONIA in the last 168 hours.  Coagulation Profile:  Recent Labs Lab 01/28/16 0608  INR 1.35    Cardiac Enzymes: No results for input(s): CKTOTAL, CKMB, CKMBINDEX, TROPONINI in the  last 168 hours.  BNP (last 3 results) No results for input(s): PROBNP in the last 8760 hours.  HbA1C: No results for input(s): HGBA1C in the last 72 hours.  CBG: No results for input(s): GLUCAP in the last 168 hours.  Lipid Profile: No results for input(s): CHOL, HDL, LDLCALC, TRIG, CHOLHDL, LDLDIRECT in the last 72 hours.  Thyroid Function Tests: No results for input(s): TSH, T4TOTAL, FREET4, T3FREE, THYROIDAB in the last 72 hours.  Anemia Panel:  Recent Labs  01/28/16 0019  VITAMINB12 211  FOLATE 7.1  FERRITIN 651*  TIBC 176*  IRON 7*  RETICCTPCT 1.2    Urine analysis:    Component Value Date/Time   COLORURINE YELLOW 01/27/2016 [place] 01/27/2016 1246   LABSPEC 1.022 01/27/2016 1246   PHURINE 6.0 01/27/2016 [place] 01/27/2016 1246   HGBUR NEGATIVE 01/27/2016 1246   BILIRUBINUR NEGATIVE 01/27/2016 [place] 01/27/2016 [place] 01/27/2016 1246   NITRITE NEGATIVE 01/27/2016 1246   LEUKOCYTESUR NEGATIVE 01/27/2016 1246    Sepsis  Labs: Lactic Acid, Venous No results found for: LATICACIDVEN  MICROBIOLOGY: No results found for this or any previous visit (from the past 240 hour(s)).  RADIOLOGY STUDIES/RESULTS: Dg Chest 2 View  Result Date: 01/27/2016 CLINICAL DATA:  Intermittent left lower extremity pain after the last year. 30 pound weight loss over the last 2 months. EXAM: CHEST  2 VIEW COMPARISON:  Chest CT 06/16/2007. FINDINGS: Heart size is normal. Mediastinal shadows are normal. Left lung is clear. There is consolidation and collapse in the right lower lobe. Right upper lobe is clear. Some volume loss in the right middle lobe. There is some pleural density on the right laterally. IMPRESSION: Consolidation and volume loss in the right lower lobe. Mild volume loss in the right middle lobe. Pleural density on the right laterally. Findings could be due to chronic pneumonia with volume loss, but  the possibility of malignant disease does exist. Chest CT with contrast suggested when able. Electronically Signed   By: Nelson Chimes M.D.   On: 01/27/2016 12:59   Dg Lumbar Spine Complete  Result Date: 12/30/2015 CLINICAL DATA:  Fall. EXAM: LUMBAR SPINE - COMPLETE 4+ VIEW COMPARISON:  No recent . FINDINGS: Mild degenerative changes lumbar spine. Minimal compression of L1 and L5 noted. Age undetermined . Normal alignment mineralization. Aortic atherosclerotic vascular calcification. IMPRESSION: Minimal compression of L1 and L5, age undetermined. Electronically Signed   By: Marcello Moores  Register   On: 12/30/2015 11:33   Ct Angio Chest/abd/pel For Dissection W And/or W/wo  Result Date: 01/27/2016 CLINICAL DATA:  Intermittent LEFT lower extremity pain for 1 year and intermittent foot numbness. 30 pound weight loss in 2 months. Follow-up possible chest malignancy. EXAM: CT ANGIOGRAPHY CHEST, ABDOMEN AND PELVIS TECHNIQUE: Multidetector CT imaging through the chest, abdomen and pelvis was performed using the standard protocol during bolus administration of intravenous contrast. Multiplanar reconstructed images and MIPs were obtained and reviewed to evaluate the vascular anatomy. CONTRAST:  80 cc Isovue 370 COMPARISON:  Chest radiograph January 27, 2016 at 1238 hours FINDINGS: CTA CHEST FINDINGS CARDIOVASCULAR: Thoracic aorta is normal course and caliber. No intrinsic density on noncontrast CT. Homogeneous contrast opacification of thoracic aorta without dissection, aneurysm, luminal irregularity, periaortic fluid collections, or contrast extravasation. Trace coronary artery calcifications. Heart size is normal. No pericardial effusion. MEDIASTINUM/NODES: 2.5 mm sub carinal nodal conglomeration. 12 mm RIGHT anterior mediastinal/ prevascular necrotic lymph node. RIGHT hilar lymphadenopathy contiguous with RIGHT lung mass. LUNGS/PLEURA: 9.1 x 13.2 x 13.9 cm RIGHT lung base mass contiguous with the RIGHT hilum, and is  contiguous with the diaphragm and pleura. Effaced RIGHT lower lobe bronchi. RIGHT hilar lymphadenopathy narrows the RIGHT middle lobe bronchi. Mass encases the RIGHT lower lobe pulmonary arteries which are patent. No chest wall invasion. Patchy ground-glass nodules and opacities RIGHT lower lobe. LEFT lung base dependent atelectasis. Mild centrilobular emphysema. 3 mm RIGHT middle lobe pulmonary nodule versus granuloma. MUSCULOSKELETAL: Nonsuspicious. Sub cm LEFT thyroid nodule below size followup recommendations. Review of the MIP images confirms the above findings. CTA ABDOMEN AND PELVIS FINDINGS ARTERIES: Abdominal aorta is normal course and caliber. Mild calcific atherosclerosis. 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 Reconstitution of distal LEFT external iliac artery. Reconstitution of distal LEFT internal iliac arteries. Homogeneous contrast opacification of the aorta vessels without dissection, aneurysm, luminal irregularity, periaortic fluid collections, or contrast extravasation. Celiac axis, superior and inferior mesenteric arteries are patent. Moderate stenosis inferior mesenteric artery origin. HEPATOBILIARY: Liver and gallbladder are normal. PANCREAS: Normal. SPLEEN: Normal.  ADRENALS/URINARY TRACT: Kidneys are orthotopic, demonstrating symmetric enhancement. No nephrolithiasis, hydronephrosis or solid renal masses. 4.8 cm RIGHT interpolar benign-appearing cyst. The unopacified ureters are normal in course and caliber. Urinary bladder is well distended and unremarkable. Normal adrenal glands. STOMACH/BOWEL: The stomach, small and large bowel are normal in course and caliber without inflammatory changes decreased sensitivity without enteric contrast. Normal appendix. VASCULAR/LYMPHATIC: No lymphadenopathy by CT size criteria. REPRODUCTIVE: Normal. OTHER: No intraperitoneal free fluid or free air. MUSCULOSKELETAL: Nonacute. Bridging sacroiliac  osteophytes. Nondisplaced RIGHT L3, L4 healing transverse process fractures. Review of the MIP images confirms the above findings. IMPRESSION: CTA CHEST: 9 x 1 x 13.2 x 13.9 cm RIGHT lung mass contiguous with the hilum compatible with neoplasm, primary lung versus lymphoma. Mediastinal lymphadenopathy. RIGHT lower lobe probable postobstructive pneumonia. Small RIGHT pleural effusion. Moderate emphysema. No acute vascular process. 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 Reconstitution distal LEFT internal external iliac arteries. Mild atherosclerosis. Acute to subacute nondisplaced RIGHT L3 and L4 transverse process fractures appear traumatic, not pathologic. Acute findings discussed with and reconfirmed by Dr.WARREN JONES on 01/27/2016 at 5:45 pm. Electronically Signed   By: Elon Alas M.D.   On: 01/27/2016 17:50     LOS: 0 days   Oren Binet, MD  [facility] Pager:[phone]  If 7PM-7AM, please contact night-coverage [email or web address] Password [email or web address] 01/28/2016, 11:38 AM

## 2016-01-28 NOTE — Progress Notes (Signed)
Notified attending MD of patient's hgb this AM of 7.0. No telephone orders received.

## 2016-01-28 NOTE — Progress Notes (Signed)
ONCOLOGY SHORT NOTE  Oncology consult received late last night from ED. I shall be seeing the patient today. Chart reviewed. Appreciate pulmonary input.  Agree with IR guided biopsy - would need adequate tissue for foundation One and PDL1 testing if NSCLC Would recommend MRI brain for staging workup completion. 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  Sullivan Lone MD [place] [facility]IV[facility] Hematology/Oncology Physician [place]  (Office):       [phone] (Work cell):  [phone] (Fax):           [phone]

## 2016-01-28 NOTE — Progress Notes (Signed)
Patient post assessment after 15 minutes of receiving one unit of PRBS. Patient asyptomatic, voices no complaints, no SOB. VSS.

## 2016-01-28 NOTE — Consult Note (Signed)
Consult Note  Patient name: [name] Chapman MRN: [id number] DOB: 11/23/1960 Sex: male  Consulting Physician:  ER  Reason for Consult:  Chief Complaint  Patient presents with  . left knee/leg pain    HISTORY OF PRESENT ILLNESS: As is a 55 year old gentleman who presented to the emergency department with worsening left leg pain over the past week.  He states that he has had tingling in his leg for approximately 6 months.  He has had problems with walking for proximally 6 years.  He now states he can walk approximately 100 feet.  He does have difficulty going up steps, approximately 3 steps before he has leg pain.  He does not have any open wounds.  He does not have rest pain.  During his ER visit he underwent a CT scan which showed a large lung mass.  He is scheduled for bx today.  He is a current smoker.  Complaining of SOB  History reviewed. No pertinent past medical history.  History reviewed. No pertinent surgical history.  Social History   Social History  . Marital status: Single    Spouse name: N/A  . Number of children: N/A  . Years of education: N/A   Occupational History  . Not on file.   Social History Main Topics  . Smoking status: Current Every Day Smoker    Packs/day: 1.00  . Smokeless tobacco: Never Used  . Alcohol use 3.6 oz/week    6 Cans of beer per week     Comment: drinks daily  . Drug use: No  . Sexual activity: Not on file   Other Topics Concern  . Not on file   Social History Narrative  . No narrative on file    Family History  Problem Relation Age of Onset  . CAD Father     Allergies as of 01/27/2016  . (No Known Allergies)    No current facility-administered medications on file prior to encounter.    Current Outpatient Prescriptions on File Prior to Encounter  Medication Sig Dispense Refill  . Aspirin-Salicylamide-Caffeine (BC HEADACHE POWDER PO) Take 1 Package by mouth daily.    . cyclobenzaprine (FLEXERIL) 10 MG tablet Take 1  tablet (10 mg total) by mouth 2 (two) times daily as needed for muscle spasms. (Patient not taking: Reported on 01/27/2016) 20 tablet 0  . diclofenac (CATAFLAM) 50 MG tablet Take 1 tablet (50 mg total) by mouth 3 (three) times daily. (Patient not taking: Reported on 01/27/2016) 30 tablet 0     REVIEW OF SYSTEMS: Cardiovascular: No chest pain, chest pressure, positive claudication, left leg Pulmonary: positive SOB Neurologic: No weakness, paresthesias, aphasia, or amaurosis. No dizziness. Hematologic: No bleeding problems or clotting disorders. Musculoskeletal: No joint pain or joint swelling. Gastrointestinal: No blood in stool or hematemesis Genitourinary: No dysuria or hematuria. Psychiatric:: No history of major depression. Integumentary: No rashes or ulcers. Constitutional: No fever or chills.  PHYSICAL EXAMINATION: General: The patient appears their stated age.  Vital signs are BP 107/63 (BP Location: Left Arm)   Pulse (!) 102   Temp 98.7 F (37.1 C) (Oral)   Resp 18   Ht '5\' 6"'$  (1.676 m)   Wt 126 lb 5.2 oz (57.3 kg)   SpO2 94%   BMI 20.39 kg/m  Pulmonary: Respirations are non-labored HEENT:  No gross abnormalities Abdomen: Soft and non-tender  Musculoskeletal: There are no major deformities.   Neurologic: No focal weakness or paresthesias are detected,  Skin: There are no ulcer or rashes noted. Psychiatric: The patient has normal affect. Cardiovascular: There is a regular rate and rhythm without significant murmur appreciated.  Left femoral pulse not palpable  Diagnostic Studies: I have reviewed his CT  Scan which shows a CHRONIC left iliac occlusion and lung mass    Assessment:  Left leg claudication Plan: Will address left iliac occlusion after lung mass work up.  He does not have any evidence of acute ischemia.  His left foot has been numb for approximately 6 months.  He will need angiography once medical issues are stable.   I would start him on a statin, after  checking his cholesterol, given his PAD.  I would also start an '81mg'$  ASA when appropriate.  Needs smoking cessation.  Will follwo     V. Leia Alf, M.D. [facility] Office: [phone] Pager:  [phone]

## 2016-01-28 NOTE — Progress Notes (Signed)
Name: NILES ESS MRN: [id number] DOB: 1960-08-10    ADMISSION DATE:  01/27/2016 CONSULTATION DATE:  01/27/16  REFERRING MD :  Hal Hope  CHIEF COMPLAINT:  L leg pain   HISTORY OF PRESENT ILLNESS:  [name] Chapman is a 55 y.o. male with a significant past medical history.  He presented to [facility] ED on 01/27/16 with left LE pain. He states that symptoms began one year ago and gradually began to get worse. Now he has got to the point where he cannot even walk up a flight of stairs and even walking on flat surfaces, he has to pause to take breaks in order to get relief. He has been using Tylenol and BC powder without much relief. He has never had similar symptoms in the past. He is fairly active at baseline and he works in a [employer]. He has not had any numbness or tingling in the foot, simply burning pain.  Upon further questioning, he also reports a roughly 30 pound weight loss in the past 2 months. This has been unintentional. He has had normal appetite has been eating and drinking normally. He is a current every day smoker, smoked roughly 1 pack per day and has been for the past 30+years.  He has not noted any changes to his stools and he has not had any screening colonoscopy.  In ED, he had ABI which demonstrated normal findings on the right however severe arterial disease on the left. CXR revealed RLL opacity versus mass. This was followed up by CTA of the chest which demonstrated a large 9 x 13.2 x 13.9 cm right lower lobe lung mass was contiguous with the hilum, along with mediastinal lymphadenopathy and small right pleural effusion. PCCM was asked to see for consideration of FOB. He denies any chest pain, cough, SOB, hemoptysis.  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   SUBJECTIVE:   Feeling a bit better today  VITAL SIGNS: Temp:  [98.4 F  (36.9 C)-99.6 F (37.6 C)] 99.6 F (37.6 C) (11/16 1344) Pulse Rate:  [71-105] 105 (11/16 1344) Resp:  [18] 18 (11/16 1344) BP: (107-132)/(62-88) 109/66 (11/16 1344) SpO2:  [94 %-100 %] 96 % (11/16 1344) Weight:  [57.3 kg (126 lb 5.2 oz)] 57.3 kg (126 lb 5.2 oz) (11/15 2155)  PHYSICAL EXAMINATION: General: Middle aged male, resting in bed, in NAD. Neuro: A&O x 3, non-focal.  HEENT: [place]/AT. PERRL, sclerae anicteric. Cardiovascular: RRR, no M/R/G.  Lungs: Respirations unlabored.  Clear bilaterally, slightly diminished in right base. Abdomen: BS x 4, soft, NT/ND.  Musculoskeletal: No gross deformities, no edema.  Skin: Intact, warm, no rashes.     Recent Labs Lab 01/27/16 1232 01/28/16 0608  NA 135 133*  K 4.6 4.3  CL 106 102  CO2 22 22  BUN 9 7  CREATININE 0.66 0.59*  GLUCOSE 91 87    Recent Labs Lab 01/27/16 1232 01/28/16 0608  HGB 7.4* 7.0*  HCT 23.9* 23.3*  WBC 17.0* 16.6*  PLT 737* 760*   Dg Chest 2 View  Result Date: 01/27/2016 CLINICAL DATA:  Intermittent left lower extremity pain after the last year. 30 pound weight loss over the last 2 months. EXAM: CHEST  2 VIEW COMPARISON:  Chest CT 06/16/2007. FINDINGS: Heart size is normal. Mediastinal shadows are normal. Left lung is clear. There is consolidation and collapse in the right  lower lobe. Right upper lobe is clear. Some volume loss in the right middle lobe. There is some pleural density on the right laterally. IMPRESSION: Consolidation and volume loss in the right lower lobe. Mild volume loss in the right middle lobe. Pleural density on the right laterally. Findings could be due to chronic pneumonia with volume loss, but the possibility of malignant disease does exist. Chest CT with contrast suggested when able. Electronically Signed   By: Nelson Chimes M.D.   On: 01/27/2016 12:59   Ct Angio Chest/abd/pel For Dissection W And/or W/wo  Result Date: 01/27/2016 CLINICAL DATA:  Intermittent LEFT lower extremity  pain for 1 year and intermittent foot numbness. 30 pound weight loss in 2 months. Follow-up possible chest malignancy. EXAM: CT ANGIOGRAPHY CHEST, ABDOMEN AND PELVIS TECHNIQUE: Multidetector CT imaging through the chest, abdomen and pelvis was performed using the standard protocol during bolus administration of intravenous contrast. Multiplanar reconstructed images and MIPs were obtained and reviewed to evaluate the vascular anatomy. CONTRAST:  80 cc Isovue 370 COMPARISON:  Chest radiograph January 27, 2016 at 1238 hours FINDINGS: CTA CHEST FINDINGS CARDIOVASCULAR: Thoracic aorta is normal course and caliber. No intrinsic density on noncontrast CT. Homogeneous contrast opacification of thoracic aorta without dissection, aneurysm, luminal irregularity, periaortic fluid collections, or contrast extravasation. Trace coronary artery calcifications. Heart size is normal. No pericardial effusion. MEDIASTINUM/NODES: 2.5 mm sub carinal nodal conglomeration. 12 mm RIGHT anterior mediastinal/ prevascular necrotic lymph node. RIGHT hilar lymphadenopathy contiguous with RIGHT lung mass. LUNGS/PLEURA: 9.1 x 13.2 x 13.9 cm RIGHT lung base mass contiguous with the RIGHT hilum, and is contiguous with the diaphragm and pleura. Effaced RIGHT lower lobe bronchi. RIGHT hilar lymphadenopathy narrows the RIGHT middle lobe bronchi. Mass encases the RIGHT lower lobe pulmonary arteries which are patent. No chest wall invasion. Patchy ground-glass nodules and opacities RIGHT lower lobe. LEFT lung base dependent atelectasis. Mild centrilobular emphysema. 3 mm RIGHT middle lobe pulmonary nodule versus granuloma. MUSCULOSKELETAL: Nonsuspicious. Sub cm LEFT thyroid nodule below size followup recommendations. Review of the MIP images confirms the above findings. CTA ABDOMEN AND PELVIS FINDINGS ARTERIES: Abdominal aorta is normal course and caliber. Mild calcific atherosclerosis. 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 Reconstitution of distal LEFT external iliac artery. Reconstitution of distal LEFT internal iliac arteries. Homogeneous contrast opacification of the aorta vessels without dissection, aneurysm, luminal irregularity, periaortic fluid collections, or contrast extravasation. Celiac axis, superior and inferior mesenteric arteries are patent. Moderate stenosis inferior mesenteric artery origin. HEPATOBILIARY: Liver and gallbladder are normal. PANCREAS: Normal. SPLEEN: Normal. ADRENALS/URINARY TRACT: Kidneys are orthotopic, demonstrating symmetric enhancement. No nephrolithiasis, hydronephrosis or solid renal masses. 4.8 cm RIGHT interpolar benign-appearing cyst. The unopacified ureters are normal in course and caliber. Urinary bladder is well distended and unremarkable. Normal adrenal glands. STOMACH/BOWEL: The stomach, small and large bowel are normal in course and caliber without inflammatory changes decreased sensitivity without enteric contrast. Normal appendix. VASCULAR/LYMPHATIC: No lymphadenopathy by CT size criteria. REPRODUCTIVE: Normal. OTHER: No intraperitoneal free fluid or free air. MUSCULOSKELETAL: Nonacute. Bridging sacroiliac osteophytes. Nondisplaced RIGHT L3, L4 healing transverse process fractures. Review of the MIP images confirms the above findings. IMPRESSION: CTA CHEST: 9 x 1 x 13.2 x 13.9 cm RIGHT lung mass contiguous with the hilum compatible with neoplasm, primary lung versus lymphoma. Mediastinal lymphadenopathy. RIGHT lower lobe probable postobstructive pneumonia. Small RIGHT pleural effusion. Moderate emphysema. No acute vascular process. 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 Reconstitution  distal LEFT internal external iliac arteries. Mild atherosclerosis. Acute to subacute nondisplaced RIGHT L3 and L4 transverse process fractures appear traumatic, not pathologic. Acute findings discussed with and reconfirmed by  Dr.WARREN JONES on 01/27/2016 at 5:45 pm. Electronically Signed   By: Elon Alas M.D.   On: 01/27/2016 17:50    STUDIES:  CTA chest 11/15 > large 9 x 13.2 x 13.9 cm right lower lobe lung mass was contiguous with the hilum compatible with neoplasm, primary lung versus lymphoma.  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 ABI 11/15 > normal findings on the right however severe arterial disease on the left.  SIGNIFICANT EVENTS  11/15 >admitted with left leg pain due to severe peripheral arterial disease. Additionally, had incidental finding of a large right lung mass.  ASSESSMENT / PLAN:  Large right lower lobe lung mass - incidental finding in a patient with roughly 30-pack-year smoking history along with his history of unintentional weight loss, making this highly suspicious for malignancy. Mild emphysema. Plan: Discussed CT scan findings with the patient. I believe that bronchoscopy should be high yield, suspect that there is an endobronchial lesion. Will arrange for 11:00 on 11/17.  NPO at [place], stop heparin Will likely also need further imaging for staging purposes. Oncology following DuoNebs / Albuterol PRN.  Baltazar Apo, MD, PhD 01/28/2016, 2:14 PM [facility] [phone] or if no answer [phone]

## 2016-01-28 NOTE — Progress Notes (Signed)
Initial Nutrition Assessment  DOCUMENTATION CODES:   Severe malnutrition in context of chronic illness  INTERVENTION:   - Provide Ensure Enlive oral nutrition supplement BID. Each provides 350 kcal and 20 grams protein. - Encourage PO intake.  NUTRITION DIAGNOSIS:   Malnutrition related to chronic illness as evidenced by severe depletion of muscle mass, moderate depletion of body fat, percent weight loss of 22.2% in 7-8 months.  GOAL:   Patient will meet greater than or equal to 90% of their needs  MONITOR:   PO intake, Supplement acceptance, Labs, Weight trends  REASON FOR ASSESSMENT:   Malnutrition Screening Tool   ASSESSMENT:   55 y.o. male with tobacco abuse presents to the ER because of worsening left lower extremity pain over the last few days. Denies any fall or trauma. Has been having some productive cough and weight loss of 30 pounds last few months. 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 Labs also revealed microcytic hypochromic anemia which is new.  Pt with right lung mass awaiting further work-up.  Spoke with pt at bedside who reports having a good appetite and consuming 1-2 meals per day PTA. PTA, pt eats lunch and dinner meal that might include fried chicken or spaghetti. Pt reports grocery shopping and cooking at home and denies any issues ambulating.  Pt states that he has been eating the same amount and types of food but is still losing weight. Pt reports UBW as 162# and that he last weighed this in March or April of 2017 (22.2% weight loss in 7-8 months, significant for timeframe). No weight history available in chart.  Pt has never consumed oral nutrition supplements but is interested in receiving Ensure Enlive oral nutrition supplement BID. Will order.  Medications reviewed and include 1 mg folic acid daily, MVI with  minerals daily, 100 mg thiamine daily, PRN Zofran  Labs reviewed and include low sodium (133 mmol/L), low creatinine (0.59 mg/dL), low calcium (8.6 mg/dL), low hemoglobin (7.0 g/dL)  NFPE: Exam completed. Moderate fat depletion, severe muscle depletion, and no edema noted.  Diet Order:  Diet Heart Room service appropriate? Yes; Fluid consistency: Thin  Skin:  Reviewed, no issues  Last BM:  01/27/16  Height:   Ht Readings from Last 1 Encounters:  01/27/16 '5\' 6"'$  (1.676 m)    Weight:   Wt Readings from Last 1 Encounters:  01/27/16 126 lb 5.2 oz (57.3 kg)    Ideal Body Weight:  64.5 kg  BMI:  Body mass index is 20.39 kg/m.  Estimated Nutritional Needs:   Kcal:  1700-1900 (30-33 kcal/kg)  Protein:  80-95 grams (1.4-1.6 g/kg)  Fluid:  1.7-1.9 L/day  EDUCATION NEEDS:   No education needs identified at this time  Jeb Levering Dietetic Intern Pager Number: [phone]

## 2016-01-28 NOTE — Consult Note (Signed)
[name]    HEMATOLOGY/ONCOLOGY CONSULTATION NOTE  Date of Service: 01/28/2016  Patient Care Team: No Pcp Per Patient as PCP - General (General Practice)  CHIEF COMPLAINTS/PURPOSE OF CONSULTATION:   Newly noted lung mass concerning for lung cancer Microcytic Anemia  HISTORY OF PRESENTING ILLNESS:   [name] Chapman is a 55 y.o. male who has been referred to [place] by Dr Evalee Mutton Kristeen Mans, MD for evaluation and management of newly noted Lung mass and anemia.  Patient has had no significant medical followup and no known medical history who presented with worsening LLE pain for about 1 week. He has a h/o smoking 1PPD.  He noted a productive cough and 25-30lbs weight loss in the last 2-3 months. CXR showed a Rt lung mass.  Patient had a CTA of the chest/abd/pelvis which showed 9 x 1 x 13.2 x 13.9 cm RIGHT lung mass contiguous with the hilum compatible with neoplasm, primary lung versus lymphoma. Mediastinal lymphadenopathy. RIGHT lower lobe probable postobstructive pneumonia. Small RIGHT pleural effusion. 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 Reconstitution distal LEFT internal external iliac arteries. Mild atherosclerosis. Acute to subacute nondisplaced RIGHT L3 and L4 transverse process fractures appear traumatic, not pathologic.  Vascular surgery involved and recommended outpatient workup. He was admitted to expedite his w/u for newly diagnosed lung mass. He was noted to have microcytic anemia with hgb down to 7 with nl ferritin and neg FOBT suggesting likely anemia of chronic disease from his malignancy.  No fevers/chills night sweats noted. Patient was evaluated by IR -lung mass noted to be fairly necrotic and they recommended bronchscopic biopsy. Patient has been evaluated by Pulmonary Dr Lamonte Sakai and has been scheduled for bronchoscopy tomorrow AM.   MEDICAL HISTORY:  History reviewed. No pertinent past medical history.  Patient with very poor medical  follow-up -Tobacco abuse -?Etoh abuse -Peripheral vascular disease.  SURGICAL HISTORY: History reviewed. No pertinent surgical history.  SOCIAL HISTORY: Social History   Social History  . Marital status: Single    Spouse name: N/A  . Number of children: N/A  . Years of education: N/A   Occupational History  . Not on file.   Social History Main Topics  . Smoking status: Current Every Day Smoker    Packs/day: 1.00  . Smokeless tobacco: Never Used  . Alcohol use 3.6 oz/week    6 Cans of beer per week     Comment: drinks daily  . Drug use: No  . Sexual activity: Not on file   Other Topics Concern  . Not on file   Social History Narrative  . No narrative on file    FAMILY HISTORY: Family History  Problem Relation Age of Onset  . CAD Father     ALLERGIES:  has No Known Allergies.  MEDICATIONS:  Current Facility-Administered Medications  Medication Dose Route Frequency Provider Last Rate Last Dose  . acetaminophen (TYLENOL) tablet 650 mg  650 mg Oral Q6H PRN Rise Patience, MD       Or  . acetaminophen (TYLENOL) suppository 650 mg  650 mg Rectal Q6H PRN Rise Patience, MD      . albuterol (PROVENTIL) (2.5 MG/3ML) 0.083% nebulizer solution 2.5 mg  2.5 mg Nebulization Q3H PRN Rahul P Desai, PA-C      . feeding supplement (ENSURE ENLIVE) (ENSURE ENLIVE) liquid 237 mL  237 mL Oral BID BM Jenifer A Williams, RD   237 mL at 01/28/16 1515  . folic acid (FOLVITE) tablet 1 mg  1 mg Oral Daily Rise Patience, MD   1 mg at 01/28/16 1122  . ipratropium-albuterol (DUONEB) 0.5-2.5 (3) MG/3ML nebulizer solution 3 mL  3 mL Nebulization BID Jonetta Osgood, MD   3 mL at 01/28/16 1952  . LORazepam (ATIVAN) injection 0-4 mg  0-4 mg Intravenous Q6H Rise Patience, MD       Followed by  . LORazepam (ATIVAN) injection 0-4 mg  0-4 mg Intravenous Q12H Rise Patience, MD      . LORazepam (ATIVAN) tablet 1 mg  1 mg Oral Q6H PRN Rise Patience, MD       Or  .  LORazepam (ATIVAN) injection 1 mg  1 mg Intravenous Q6H PRN Rise Patience, MD      . multivitamin with minerals tablet 1 tablet  1 tablet Oral Daily Rise Patience, MD   1 tablet at 01/28/16 1122  . nicotine (NICODERM CQ - dosed in mg/24 hours) patch 14 mg  14 mg Transdermal QHS Gardiner Barefoot, NP   14 mg at 01/28/16 2127  . ondansetron (ZOFRAN) tablet 4 mg  4 mg Oral Q6H PRN Rise Patience, MD       Or  . ondansetron [facility]) injection 4 mg  4 mg Intravenous Q6H PRN Rise Patience, MD      . thiamine (VITAMIN B-1) tablet 100 mg  100 mg Oral Daily Rise Patience, MD   100 mg at 01/28/16 1122   Or  . thiamine (B-1) injection 100 mg  100 mg Intravenous Daily Rise Patience, MD        REVIEW OF SYSTEMS:    10 Point review of Systems was done is negative except as noted above.  PHYSICAL EXAMINATION: ECOG PERFORMANCE STATUS: 1 - Symptomatic but completely ambulatory  . Vitals:   01/27/16 2155 01/28/16 0535  BP: 118/70 107/63  Pulse: 71 (!) 102  Resp: 18 18  Temp: 98.4 F (36.9 C) 98.7 F (37.1 C)   Filed Weights   01/27/16 2155  Weight: 126 lb 5.2 oz (57.3 kg)   .Body mass index is 20.39 kg/m.  GENERAL:alert, in no acute distress and comfortable SKIN: skin color, texture, turgor are normal, no rashes or significant lesions EYES: normal, conjunctiva are pink and non-injected, sclera clear OROPHARYNX:no exudate, no erythema and lips, buccal mucosa, and tongue normal  NECK: supple, no JVD, thyroid normal size, non-tender, without nodularity LYMPH:  no palpable lymphadenopathy in the cervical, axillary or inguinal LUNGS: clear to auscultation , bronchial breath sounds rt lung base. HEART: regular rate & rhythm,  no murmurs and no lower extremity edema ABDOMEN: abdomen soft, non-tender, normoactive bowel sounds  Musculoskeletal: no cyanosis of digits and no clubbing  PSYCH: alert & oriented x 3 with fluent speech NEURO: no focal motor/sensory  deficits  LABORATORY DATA:  I have reviewed the data as listed  . CBC Latest Ref Rng & Units 01/28/2016 01/27/2016 01/24/2015  WBC 4.0 - 10.5 K/uL 16.6(H) 17.0(H) 11.1(H)  Hemoglobin 13.0 - 17.0 g/dL 7.0(L) 7.4(L) 15.3  Hematocrit 39.0 - 52.0 % 23.3(L) 23.9(L) 43.7  Platelets 150 - 400 K/uL 760(H) 737(H) 271    . CMP Latest Ref Rng & Units 01/28/2016 01/27/2016 01/24/2015  Glucose 65 - 99 mg/dL 87 91 71  BUN 6 - 20 mg/dL 7 9 5(L)  Creatinine 0.61 - 1.24 mg/dL 0.59(L) 0.66 0.77  Sodium 135 - 145 mmol/L 133(L) 135 134(L)  Potassium 3.5 - 5.1 mmol/L 4.3 4.6  3.6  Chloride 101 - 111 mmol/L 102 106 103  CO2 22 - 32 mmol/L '22 22 22  ' Calcium 8.9 - 10.3 mg/dL 8.6(L) 8.5(L) 8.9  Total Protein 6.5 - 8.1 g/dL 7.8 7.9 -  Total Bilirubin 0.3 - 1.2 mg/dL 0.3 0.2(L) -  Alkaline Phos 38 - 126 U/L 283(H) 289(H) -  AST 15 - 41 U/L 15 20 -  ALT 17 - 63 U/L 10(L) 13(L) -     RADIOGRAPHIC STUDIES: I have personally reviewed the radiological images as listed and agreed with the findings in the report. Dg Chest 2 View  Result Date: 01/27/2016 CLINICAL DATA:  Intermittent left lower extremity pain after the last year. 30 pound weight loss over the last 2 months. EXAM: CHEST  2 VIEW COMPARISON:  Chest CT 06/16/2007. FINDINGS: Heart size is normal. Mediastinal shadows are normal. Left lung is clear. There is consolidation and collapse in the right lower lobe. Right upper lobe is clear. Some volume loss in the right middle lobe. There is some pleural density on the right laterally. IMPRESSION: Consolidation and volume loss in the right lower lobe. Mild volume loss in the right middle lobe. Pleural density on the right laterally. Findings could be due to chronic pneumonia with volume loss, but the possibility of malignant disease does exist. Chest CT with contrast suggested when able. Electronically Signed   By: Nelson Chimes M.D.   On: 01/27/2016 12:59   Dg Lumbar Spine Complete  Result Date:  12/30/2015 CLINICAL DATA:  Fall. EXAM: LUMBAR SPINE - COMPLETE 4+ VIEW COMPARISON:  No recent . FINDINGS: Mild degenerative changes lumbar spine. Minimal compression of L1 and L5 noted. Age undetermined . Normal alignment mineralization. Aortic atherosclerotic vascular calcification. IMPRESSION: Minimal compression of L1 and L5, age undetermined. Electronically Signed   By: Marcello Moores  Register   On: 12/30/2015 11:33   Ct Angio Chest/abd/pel For Dissection W And/or W/wo  Result Date: 01/27/2016 CLINICAL DATA:  Intermittent LEFT lower extremity pain for 1 year and intermittent foot numbness. 30 pound weight loss in 2 months. Follow-up possible chest malignancy. EXAM: CT ANGIOGRAPHY CHEST, ABDOMEN AND PELVIS TECHNIQUE: Multidetector CT imaging through the chest, abdomen and pelvis was performed using the standard protocol during bolus administration of intravenous contrast. Multiplanar reconstructed images and MIPs were obtained and reviewed to evaluate the vascular anatomy. CONTRAST:  80 cc Isovue 370 COMPARISON:  Chest radiograph January 27, 2016 at 1238 hours FINDINGS: CTA CHEST FINDINGS CARDIOVASCULAR: Thoracic aorta is normal course and caliber. No intrinsic density on noncontrast CT. Homogeneous contrast opacification of thoracic aorta without dissection, aneurysm, luminal irregularity, periaortic fluid collections, or contrast extravasation. Trace coronary artery calcifications. Heart size is normal. No pericardial effusion. MEDIASTINUM/NODES: 2.5 mm sub carinal nodal conglomeration. 12 mm RIGHT anterior mediastinal/ prevascular necrotic lymph node. RIGHT hilar lymphadenopathy contiguous with RIGHT lung mass. LUNGS/PLEURA: 9.1 x 13.2 x 13.9 cm RIGHT lung base mass contiguous with the RIGHT hilum, and is contiguous with the diaphragm and pleura. Effaced RIGHT lower lobe bronchi. RIGHT hilar lymphadenopathy narrows the RIGHT middle lobe bronchi. Mass encases the RIGHT lower lobe pulmonary arteries which are  patent. No chest wall invasion. Patchy ground-glass nodules and opacities RIGHT lower lobe. LEFT lung base dependent atelectasis. Mild centrilobular emphysema. 3 mm RIGHT middle lobe pulmonary nodule versus granuloma. MUSCULOSKELETAL: Nonsuspicious. Sub cm LEFT thyroid nodule below size followup recommendations. Review of the MIP images confirms the above findings. CTA ABDOMEN AND PELVIS FINDINGS ARTERIES: Abdominal aorta is normal course and caliber. Mild  calcific atherosclerosis. 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 Reconstitution of distal LEFT external iliac artery. Reconstitution of distal LEFT internal iliac arteries. Homogeneous contrast opacification of the aorta vessels without dissection, aneurysm, luminal irregularity, periaortic fluid collections, or contrast extravasation. Celiac axis, superior and inferior mesenteric arteries are patent. Moderate stenosis inferior mesenteric artery origin. HEPATOBILIARY: Liver and gallbladder are normal. PANCREAS: Normal. SPLEEN: Normal. ADRENALS/URINARY TRACT: Kidneys are orthotopic, demonstrating symmetric enhancement. No nephrolithiasis, hydronephrosis or solid renal masses. 4.8 cm RIGHT interpolar benign-appearing cyst. The unopacified ureters are normal in course and caliber. Urinary bladder is well distended and unremarkable. Normal adrenal glands. STOMACH/BOWEL: The stomach, small and large bowel are normal in course and caliber without inflammatory changes decreased sensitivity without enteric contrast. Normal appendix. VASCULAR/LYMPHATIC: No lymphadenopathy by CT size criteria. REPRODUCTIVE: Normal. OTHER: No intraperitoneal free fluid or free air. MUSCULOSKELETAL: Nonacute. Bridging sacroiliac osteophytes. Nondisplaced RIGHT L3, L4 healing transverse process fractures. Review of the MIP images confirms the above findings. IMPRESSION: CTA CHEST: 9 x 1 x 13.2 x 13.9 cm RIGHT lung mass contiguous with the  hilum compatible with neoplasm, primary lung versus lymphoma. Mediastinal lymphadenopathy. RIGHT lower lobe probable postobstructive pneumonia. Small RIGHT pleural effusion. Moderate emphysema. No acute vascular process. 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 Reconstitution distal LEFT internal external iliac arteries. Mild atherosclerosis. Acute to subacute nondisplaced RIGHT L3 and L4 transverse process fractures appear traumatic, not pathologic. Acute findings discussed with and reconfirmed by Dr.WARREN JONES on 01/27/2016 at 5:45 pm. Electronically Signed   By: Elon Alas M.D.   On: 01/27/2016 17:50    ASSESSMENT & PLAN:    1) Rt large lung mass concerning for primary lung mass with mediastinal LN 2) ?Post-obstructive pneumonia 3) Emphysema PLAN -IR and pulmonary input appreciated -bronchoscopy scheduled for tomorrow for tissue diagnosis. -if NSCLC will need adequate tissue for molecular testing (foundation One and PDL1 status). -MRI brain for completion of staging -will likely need outpatient PET/CT -specific treatment recommendation based on staging and tissue diagnosis -will defer need for abx for ?post-operative pneumonia to hospitalist team. -f/u with Dr Irene Limbo in clinic in 1 week for further management  4) Microcytic Anemia  FOBT. Likely predominantly anemia of chronic disease No overt Iron deficiency ? Additional hemoglobinopathy . 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 -likely related to PAD due to smoking. -outpatient f/u with vascular surgery  6) Compression acute vs subacute fracture of rt L3 and L4  transverse processes  -pain mx  7) Smoking -counseled on importance of smoking cessation.  F/u with Dr Irene Limbo as outpatient in 1 week to f/u on biopsy results and further management. Patient will need to urgently setup PCP.  All of the patients questions were answered with apparent satisfaction. The patient knows to call the clinic with any problems, questions or concerns.  I spent 65 minutes counseling the patient face to face. The total time spent in the appointment was 80 minutes and more than 50% was on counseling and direct patient cares.    Sullivan Lone MD [place] AAHIVMS [facility] Hematology/Oncology  Physician [facility]  (Office):       [phone] (Work cell):  [phone] (Fax):           [phone]  01/28/2016 9:01 AM

## 2016-01-29 ENCOUNTER — Inpatient Hospital Stay (HOSPITAL_COMMUNITY): Payer: Medicaid Other

## 2016-01-29 ENCOUNTER — Encounter (HOSPITAL_COMMUNITY)
Admission: EM | Disposition: A | Discharge status: Home / Self Care | Payer: Self-pay | Source: Home / Self Care | Attending: Internal Medicine

## 2016-01-29 HISTORY — PX: VIDEO BRONCHOSCOPY: SHX5072

## 2016-01-29 LAB — BASIC METABOLIC PANEL
ANION GAP: 8 (ref 5–15)
BUN: 9 mg/dL (ref 6–20)
CO2: 25 mmol/L (ref 22–32)
Calcium: 8.6 mg/dL — ABNORMAL LOW (ref 8.9–10.3)
Chloride: 101 mmol/L (ref 101–111)
Creatinine, Ser: 0.54 mg/dL — ABNORMAL LOW (ref 0.61–1.24)
GFR calc Af Amer: >60 mL/min (ref >60)
Glucose, Bld: 92 mg/dL (ref 65–99)
POTASSIUM: 4.5 mmol/L (ref 3.5–5.1)
SODIUM: 134 mmol/L — AB (ref 135–145)

## 2016-01-29 LAB — TYPE AND SCREEN
ABO/RH(D): A NEG
Antibody Screen: NEGATIVE
Unit division: 0

## 2016-01-29 LAB — CBC
HCT: 26.1 % — ABNORMAL LOW (ref 39.0–52.0)
Hemoglobin: 8.2 g/dL — ABNORMAL LOW (ref 13.0–17.0)
MCH: 20.8 pg — ABNORMAL LOW (ref 26.0–34.0)
MCHC: 31.4 g/dL (ref 30.0–36.0)
MCV: 66.1 fL — ABNORMAL LOW (ref 78.0–100.0)
PLATELETS: 721 10*3/uL — AB (ref 150–400)
RBC: 3.95 MIL/uL — AB (ref 4.22–5.81)
RDW: 18.3 % — ABNORMAL HIGH (ref 11.5–15.5)
WBC: 16.5 10*3/uL — AB (ref 4.0–10.5)

## 2016-01-29 LAB — LACTATE DEHYDROGENASE: LDH: 216 U/L — AB (ref 98–192)

## 2016-01-29 SURGERY — BRONCHOSCOPY, WITH FLUOROSCOPY
Anesthesia: Moderate Sedation | Laterality: Bilateral

## 2016-01-29 MED ORDER — LIDOCAINE HCL (PF) 1 % IJ SOLN
INTRAMUSCULAR | Status: DC | PRN
  Administered 2016-01-29: 6 mL

## 2016-01-29 MED ORDER — LIDOCAINE HCL 2 % EX GEL: 1.0000 "application " | Freq: Once | CUTANEOUS | Status: DC

## 2016-01-29 MED ORDER — PHENYLEPHRINE HCL 0.25 % NA SOLN
1.0000 | Freq: Four times a day (QID) | NASAL | Status: DC | PRN
  Filled 2016-01-29: qty 15

## 2016-01-29 MED ORDER — FENTANYL CITRATE (PF) 100 MCG/2ML IJ SOLN
INTRAMUSCULAR | Status: AC
  Filled 2016-01-29: qty 4

## 2016-01-29 MED ORDER — MIDAZOLAM HCL 10 MG/2ML IJ SOLN
INTRAMUSCULAR | Status: DC | PRN
  Administered 2016-01-29 (×2): 1 mg via INTRAVENOUS
  Administered 2016-01-29: 2 mg via INTRAVENOUS
  Administered 2016-01-29: 1 mg via INTRAVENOUS

## 2016-01-29 MED ORDER — MIDAZOLAM HCL 5 MG/ML IJ SOLN
INTRAMUSCULAR | Status: AC
  Filled 2016-01-29: qty 2

## 2016-01-29 MED ORDER — SODIUM CHLORIDE 0.9 % IV SOLN
INTRAVENOUS | Status: DC
  Administered 2016-01-29: 10 mL via INTRAVENOUS

## 2016-01-29 MED ORDER — PHENYLEPHRINE HCL 0.25 % NA SOLN
NASAL | Status: DC | PRN
  Administered 2016-01-29: 2 via NASAL

## 2016-01-29 MED ORDER — LIDOCAINE HCL 2 % EX GEL
CUTANEOUS | Status: DC | PRN
  Administered 2016-01-29: 1

## 2016-01-29 MED ORDER — FENTANYL CITRATE (PF) 100 MCG/2ML IJ SOLN
INTRAMUSCULAR | Status: DC | PRN
  Administered 2016-01-29 (×2): 25 ug via INTRAVENOUS
  Administered 2016-01-29: 50 ug via INTRAVENOUS
  Administered 2016-01-29: 25 ug via INTRAVENOUS
  Administered 2016-01-29: 50 ug via INTRAVENOUS

## 2016-01-29 MED ORDER — HEPARIN SODIUM (PORCINE) 5000 UNIT/ML IJ SOLN: 5000.0000 [IU] | Freq: Three times a day (TID) | INTRAMUSCULAR | Status: DC

## 2016-01-29 NOTE — Progress Notes (Signed)
PROGRESS NOTE    [name] Chapman  ZSM:[id number] DOB: 02/06/1961 DOA: 01/27/2016 PCP: No PCP Per Patient, will need to set up with [facility] on discharge.   Brief Narrative:  [name] Chapman is a 55 y.o. male with long-standing history of tobacco use, presented to the hospital for evaluation of claudication pain involving his left lower extremity. This was evaluated by a CT angiogram of the chest, abdomen/pelvis which demonstrated a large right lung mass. Patient was subsequently admitted to the hospital for further evaluation and treatment.   Assessment & Plan:   Principal Problem:   Mass of right lung Active Problems:   Leg pain, inferior, left   Microcytic hypochromic anemia   Lung mass   Protein-calorie malnutrition, severe   Right lower lobe lung mass: Long-standing history of tobacco use. 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 Pulm and IR consulted. Bronchoscopy today 11/17. Will need MRI brain and outpatient PET/CT. Oncology follow up with Dr. Irene Limbo in 1 week.   Sepsis secondary to postobstructive pneumonia: Continue Unasyn   Peripheral vascular disease with left leg claudication pain: Patient has had claudication involving his left lower extremity for approximately 1 year, he can now only walk approximately 100 feet before he starts having pain. 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 Vascular surgery consulted who recommend aspirin daily, statin as well as angiography once medical issues stable.   Right L3/L4 transverse process fracture: Supportive care. No significant trauma history. ?Pathologic fracture.   Anemia: No history of obvious blood loss. High suspicion for anemia due to malignancy. 1u pRBC transfused 11/16. Trend.   Tobacco abuse: Counseled, continue transdermal nicotine.  EtOH use: Drinks approximately 40-80 ounces of beer daily. Last drink on  11/15. No signs of withdrawal at present. Continue Ativan per protocol  Severe protein calorie malnutrition: Continue supplements   DVT prophylaxis: SCDs Code Status: Full Family Communication: family at bedside Disposition Plan: Pending further workup and treatment   Consultants:   Pulmonology  Oncology  IR   Vascular sx   Procedures:   Bronchoscopy 11/17   Antimicrobials:   Unasyn 11/16 >>     Subjective: Patient feeling well today. Has no complaints. Denies any fevers, chills, chest pain, shortness of breath. Has a dry cough. No nausea, vomiting, diarrhea or abdominal pain, denies any dysuria. Continues to have numbness and tingling in the lateral 3 toes on the left lower extremity. This is chronic and has not changed in the past year.  Objective: Vitals:   01/29/16 0152 01/29/16 0313 01/29/16 0555 01/29/16 0804  BP:   (!) 104/57   Pulse:   (!) 105   Resp:   16   Temp: 100 F (37.8 C) 98.7 F (37.1 C) 99.5 F (37.5 C)   TempSrc: Oral Oral Oral   SpO2:    98%  Weight:      Height:        Intake/Output Summary (Last 24 hours) at 01/29/16 0905 Last data filed at 01/29/16 0555  Gross per 24 hour  Intake             1442 ml  Output             1025 ml  Net              417 ml   Filed Weights   01/27/16 2155  Weight: 57.3 kg (126 lb 5.2 oz)  Examination:  General exam: Appears calm and comfortable, Thin, elderly male Respiratory system: Diminished breath sounds bilaterally, Respiratory effort normal. Cardiovascular system: S1 & S2 heard, RRR. No JVD, murmurs, rubs, gallops or clicks. No pedal edema. +1 Pedal  pulse on right, no pedal pulses appreciated on left  Gastrointestinal system: Abdomen is nondistended, soft and nontender. No organomegaly or masses felt. Normal bowel sounds heard. Central nervous system: Alert and oriented. No focal neurological deficits. Extremities: Symmetric 5 x 5 power. Skin: No rashes, lesions or ulcers Psychiatry:  Judgement and insight appear normal. Mood & affect appropriate.   Data Reviewed: I have personally reviewed following labs and imaging studies  CBC:  Recent Labs Lab 01/27/16 1232 01/28/16 0608 01/28/16 1801 01/29/16 0651  WBC 17.0* 16.6* 17.3* 16.5*  NEUTROABS 12.6* 12.9*  --   --   HGB 7.4* 7.0* 7.8* 8.2*  HCT 23.9* 23.3* 24.9* 26.1*  MCV 65.3* 64.9* 66.4* 66.1*  PLT 737* 760* 720* 696*   Basic Metabolic Panel:  Recent Labs Lab 01/27/16 1232 01/28/16 0608 01/29/16 0651  NA 135 133* 134*  K 4.6 4.3 4.5  CL 106 102 101  CO2 '22 22 25  '$ GLUCOSE 91 87 92  BUN '9 7 9  '$ CREATININE 0.66 0.59* 0.54*  CALCIUM 8.5* 8.6* 8.6*   GFR: Estimated Creatinine Clearance: 84.6 mL/min (by C-G formula based on SCr of 0.54 mg/dL (L)). Liver Function Tests:  Recent Labs Lab 01/27/16 1232 01/28/16 0608  AST 20 15  ALT 13* 10*  ALKPHOS 289* 283*  BILITOT 0.2* 0.3  PROT 7.9 7.8  ALBUMIN 1.9* 1.9*   No results for input(s): LIPASE, AMYLASE in the last 168 hours. No results for input(s): AMMONIA in the last 168 hours. Coagulation Profile:  Recent Labs Lab 01/28/16 0608  INR 1.35   Cardiac Enzymes: No results for input(s): CKTOTAL, CKMB, CKMBINDEX, TROPONINI in the last 168 hours. BNP (last 3 results) No results for input(s): PROBNP in the last 8760 hours. HbA1C: No results for input(s): HGBA1C in the last 72 hours. CBG: No results for input(s): GLUCAP in the last 168 hours. Lipid Profile: No results for input(s): CHOL, HDL, LDLCALC, TRIG, CHOLHDL, LDLDIRECT in the last 72 hours. Thyroid Function Tests: No results for input(s): TSH, T4TOTAL, FREET4, T3FREE, THYROIDAB in the last 72 hours. Anemia Panel:  Recent Labs  01/28/16 0019  VITAMINB12 211  FOLATE 7.1  FERRITIN 651*  TIBC 176*  IRON 7*  RETICCTPCT 1.2   Sepsis Labs: No results for input(s): PROCALCITON, LATICACIDVEN in the last 168 hours.  No results found for this or any previous visit (from the past 240  hour(s)).     Radiology Studies: Dg Chest 2 View  Result Date: 01/27/2016 CLINICAL DATA:  Intermittent left lower extremity pain after the last year. 30 pound weight loss over the last 2 months. EXAM: CHEST  2 VIEW COMPARISON:  Chest CT 06/16/2007. FINDINGS: Heart size is normal. Mediastinal shadows are normal. Left lung is clear. There is consolidation and collapse in the right lower lobe. Right upper lobe is clear. Some volume loss in the right middle lobe. There is some pleural density on the right laterally. IMPRESSION: Consolidation and volume loss in the right lower lobe. Mild volume loss in the right middle lobe. Pleural density on the right laterally. Findings could be due to chronic pneumonia with volume loss, but the possibility of malignant disease does exist. Chest CT with contrast suggested when able. Electronically Signed   By: Nelson Chimes  M.D.   On: 01/27/2016 12:59   Ct Angio Chest/abd/pel For Dissection W And/or W/wo  Result Date: 01/27/2016 CLINICAL DATA:  Intermittent LEFT lower extremity pain for 1 year and intermittent foot numbness. 30 pound weight loss in 2 months. Follow-up possible chest malignancy. EXAM: CT ANGIOGRAPHY CHEST, ABDOMEN AND PELVIS TECHNIQUE: Multidetector CT imaging through the chest, abdomen and pelvis was performed using the standard protocol during bolus administration of intravenous contrast. Multiplanar reconstructed images and MIPs were obtained and reviewed to evaluate the vascular anatomy. CONTRAST:  80 cc Isovue 370 COMPARISON:  Chest radiograph January 27, 2016 at 1238 hours FINDINGS: CTA CHEST FINDINGS CARDIOVASCULAR: Thoracic aorta is normal course and caliber. No intrinsic density on noncontrast CT. Homogeneous contrast opacification of thoracic aorta without dissection, aneurysm, luminal irregularity, periaortic fluid collections, or contrast extravasation. Trace coronary artery calcifications. Heart size is normal. No pericardial effusion.  MEDIASTINUM/NODES: 2.5 mm sub carinal nodal conglomeration. 12 mm RIGHT anterior mediastinal/ prevascular necrotic lymph node. RIGHT hilar lymphadenopathy contiguous with RIGHT lung mass. LUNGS/PLEURA: 9.1 x 13.2 x 13.9 cm RIGHT lung base mass contiguous with the RIGHT hilum, and is contiguous with the diaphragm and pleura. Effaced RIGHT lower lobe bronchi. RIGHT hilar lymphadenopathy narrows the RIGHT middle lobe bronchi. Mass encases the RIGHT lower lobe pulmonary arteries which are patent. No chest wall invasion. Patchy ground-glass nodules and opacities RIGHT lower lobe. LEFT lung base dependent atelectasis. Mild centrilobular emphysema. 3 mm RIGHT middle lobe pulmonary nodule versus granuloma. MUSCULOSKELETAL: Nonsuspicious. Sub cm LEFT thyroid nodule below size followup recommendations. Review of the MIP images confirms the above findings. CTA ABDOMEN AND PELVIS FINDINGS ARTERIES: Abdominal aorta is normal course and caliber. Mild calcific atherosclerosis. 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 Reconstitution of distal LEFT external iliac artery. Reconstitution of distal LEFT internal iliac arteries. Homogeneous contrast opacification of the aorta vessels without dissection, aneurysm, luminal irregularity, periaortic fluid collections, or contrast extravasation. Celiac axis, superior and inferior mesenteric arteries are patent. Moderate stenosis inferior mesenteric artery origin. HEPATOBILIARY: Liver and gallbladder are normal. PANCREAS: Normal. SPLEEN: Normal. ADRENALS/URINARY TRACT: Kidneys are orthotopic, demonstrating symmetric enhancement. No nephrolithiasis, hydronephrosis or solid renal masses. 4.8 cm RIGHT interpolar benign-appearing cyst. The unopacified ureters are normal in course and caliber. Urinary bladder is well distended and unremarkable. Normal adrenal glands. STOMACH/BOWEL: The stomach, small and large bowel are normal in course and caliber  without inflammatory changes decreased sensitivity without enteric contrast. Normal appendix. VASCULAR/LYMPHATIC: No lymphadenopathy by CT size criteria. REPRODUCTIVE: Normal. OTHER: No intraperitoneal free fluid or free air. MUSCULOSKELETAL: Nonacute. Bridging sacroiliac osteophytes. Nondisplaced RIGHT L3, L4 healing transverse process fractures. Review of the MIP images confirms the above findings. IMPRESSION: CTA CHEST: 9 x 1 x 13.2 x 13.9 cm RIGHT lung mass contiguous with the hilum compatible with neoplasm, primary lung versus lymphoma. Mediastinal lymphadenopathy. RIGHT lower lobe probable postobstructive pneumonia. Small RIGHT pleural effusion. Moderate emphysema. No acute vascular process. 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 Reconstitution distal LEFT internal external iliac arteries. Mild atherosclerosis. Acute to subacute nondisplaced RIGHT L3 and L4 transverse process fractures appear traumatic, not pathologic. Acute findings discussed with and reconfirmed by Dr.WARREN JONES on 01/27/2016 at 5:45 pm. Electronically Signed   By: Elon Alas M.D.   On: 01/27/2016 17:50      Scheduled Meds: . feeding supplement (ENSURE ENLIVE)  237 mL Oral BID BM  . folic acid  1 mg Oral Daily  . ipratropium-albuterol  3 mL Nebulization BID  . LORazepam  0-4 mg Intravenous Q6H   Followed by  . LORazepam  0-4 mg Intravenous Q12H  . multivitamin with minerals  1 tablet Oral Daily  . nicotine  14 mg Transdermal QHS  . thiamine  100 mg Oral Daily   Or  . thiamine  100 mg Intravenous Daily   Continuous Infusions:   LOS: 1 day    Time spent: 40 minutes   Dessa Phi, DO [facility] [email or web address] Password [email or web address] 01/29/2016, 9:05 AM

## 2016-01-29 NOTE — Interval H&P Note (Signed)
PCCM Interval Note  No new issues reported since 11/16 NPO this am  Vitals:   01/29/16 1035 01/29/16 1040 01/29/16 1045 01/29/16 1050  BP: 123/76 119/78 118/77 110/74  Pulse: (!) 104 (!) 102 (!) 103 100  Resp: 16 (!) 22 (!) 21 18  Temp:      TempSrc:      SpO2: 99% 99% 98% 97%  Weight:      Height:       Gen: Pleasant, thin man, in no distress,  normal affect  ENT: No lesions,  mouth clear,  oropharynx clear, no postnasal drip  Neck: No JVD, no TMG, no carotid bruits  Lungs: No use of accessory muscles, distant, clear without rales or rhonchi  Cardiovascular: RRR, heart sounds normal, no murmur or gallops, no peripheral edema  Musculoskeletal: No deformities, no cyanosis or clubbing  Neuro: alert, non focal  Skin: Warm, no lesions or rashes   IMPRESSION / PLAN:  Suspect that thee will be an endobronchial lesion. Will be prepared to perform transbronchial biopsies if needed. Pt understands procedure, risks, benefits. No barriers to proceeding.   Baltazar Apo, MD, PhD 01/29/2016, 11:06 AM [facility] [phone] or if no answer [phone]

## 2016-01-29 NOTE — Progress Notes (Addendum)
  [facility] Progress Note  Subjective    Pain and numbness in left foot unchanged.   Objective Vitals:   01/29/16 0313 01/29/16 0555  BP:  (!) 104/57  Pulse:  (!) 105  Resp:  16  Temp: 98.7 F (37.1 C) 99.5 F (37.5 C)    Intake/Output Summary (Last 24 hours) at 01/29/16 0931 Last data filed at 01/29/16 0555  Gross per 24 hour  Intake             1442 ml  Output             1025 ml  Net              417 ml   Left foot warm and well perfused. No ischemic changes.   Assessment/Planning: 55 y.o. male with left leg claudication and left foot pain.  He has a chronic left iliac artery occlusion. His symptoms are stable without evidence of acute ischemia. For bronchoscopy today today to evaluate lung lesion.  Further lower extremity work-up once medical issues stable.   Alvia Grove 01/29/2016 9:31 AM --  Laboratory CBC    Component Value Date/Time   WBC 16.5 (H) 01/29/2016 0651   HGB 8.2 (L) 01/29/2016 0651   HCT 26.1 (L) 01/29/2016 0651   PLT 721 (H) 01/29/2016 0651    BMET    Component Value Date/Time   NA 134 (L) 01/29/2016 0651   K 4.5 01/29/2016 0651   CL 101 01/29/2016 0651   CO2 25 01/29/2016 0651   GLUCOSE 92 01/29/2016 0651   BUN 9 01/29/2016 0651   CREATININE 0.54 (L) 01/29/2016 0651   CALCIUM 8.6 (L) 01/29/2016 0651   GFRNONAA >60 01/29/2016 0651   GFRAA >60 01/29/2016 0651    COAG Lab Results  Component Value Date   INR 1.35 01/28/2016   No results found for: PTT  Antibiotics Anti-infectives    None       Virgina Jock, PA-C [facility] Office: [phone] Pager: [phone] 01/29/2016 9:31 AM   Agree with the above.  Will plan for attempt at percutaneous revascularization once pulmonary work-up is complete   Wells Brabham

## 2016-01-29 NOTE — Op Note (Signed)
[facility] Cardiopulmonary Patient Name: [name] Chapman Date: 01/29/2016 MRN: [id number] Attending MD: Collene Gobble , MD Date of Birth: 1960/03/29 CSN: Finalized Age: 54 Admit Type: Inpatient Gender: Male Procedure:            Bronchoscopy Indications:          Lung mass, Right lower lobe mass Providers:            Collene Gobble, MD, Doris Cheadle RRT,RCP, Phillis Knack                        RRT, RCP Referring MD:          Medicines:            Fentanyl 175 mcg IV, Midazolam 5 mg IV, Lidocaine 1%                        applied to cords 12 mL, Lidocaine 1% applied to the                        tracheobronchial tree 15 mL, Oxygen 4 L/min Complications:        No immediate complications Estimated Blood Loss: Estimated blood loss was minimal. Procedure:            Pre-Anesthesia Assessment:                       - A History and Physical has been performed. Patient                        meds and allergies have been reviewed. The risks and                        benefits of the procedure and the sedation options and                        risks were discussed with the patient. All questions                        were answered and informed consent was obtained.                        Patient identification and proposed procedure were                        verified prior to the procedure by the physician in the                        procedure room. Mental Status Examination: alert and                        oriented. Airway Examination: normal oropharyngeal                        airway. Respiratory Examination: clear to auscultation.                        CV Examination: normal. ASA Grade Assessment: II - A                        patient with  mild systemic disease. After reviewing the                        risks and benefits, the patient was deemed in                        satisfactory condition to undergo the procedure. The                        anesthesia  plan was to use moderate sedation /                        analgesia (conscious sedation). Immediately prior to                        administration of medications, the patient was                        re-assessed for adequacy to receive sedatives. The                        heart rate, respiratory rate, oxygen saturations, blood                        pressure, adequacy of pulmonary ventilation, and                        response to care were monitored throughout the                        procedure. The physical status of the patient was                        re-assessed after the procedure.                       After obtaining informed consent, the bronchoscope was                        passed under direct vision. Throughout the procedure,                        the patient's blood pressure, pulse, and oxygen                        saturations were monitored continuously. the [id number]                        [id number] scope was introduced through the right nostril                        and advanced to the tracheobronchial tree. The                        procedure was accomplished without difficulty. The                        patient tolerated the procedure fairly well. The total                        duration of the procedure  was 23 minutes. Total                        fluoroscopy time was 1 minute, 15 seconds. Scope In: 11:12:03 AM Scope Out: 11:35:13 AM Findings:      The nasopharynx/oropharynx appears normal. The larynx appears normal.       The vocal cords appear normal. The subglottic space is normal. The       trachea is of normal caliber. The carina is sharp. The tracheobronchial       tree of the left lung was examined to at least the first subsegmental       level. Bronchial mucosa and anatomy in the left lung are normal; there       are no endobronchial lesions, and no secretions.      Right Lung Abnormalities: An area of inflamed mucosa was found in the       right  lower lobe. A non-obstructing mass was found proximally, at the       orifice in the anterior basal segment of the right lower lobe (B8). The       mass was small and endobronchial and polypoid. The lesion was       successfully traversed. Endobronchial biopsies were performed in the       anterior basal segment of the right lower lobe using forceps and sent       for histopathology examination. Three samples were obtained.       Transbronchial biopsies were performed in the posterior basal segment of       the right lower lobe using forceps and sent for histopathology       examination. The procedure was guided by fluoroscopy. Transbronchial       biopsy technique was selected because the sampling site was not visible       endoscopically. Six biopsy passes were performed. Six biopsy samples       were obtained. Estimated blood loss: minimal. Impression:           - Lung mass                       - Right lower lobe mass                       - The left lung was normal.                       - Mucosal inflammation was visualized in the right                        lower lobe.                       - An endobronchial and polypoid mass was found in the                        anterior basal segment of the right lower lobe (B8).                        This lesion is possibly malignant.                       - An endobronchial biopsy was performed.                       -  Transbronchial lung biopsies were performed in the                        RLL to sample large mass noted on CT chest. Moderate Sedation:      Moderate (conscious) sedation was personally administered by the       endoscopist. The following parameters were monitored: oxygen saturation,       heart rate, blood pressure, respiratory rate, EKG, adequacy of pulmonary       ventilation, and response to care. Total physician intraservice time was       37 minutes. Recommendation:       - Await biopsy results.                        - Return patient to hospital ward for ongoing care.                       - Chest X-ray post-procedure. Procedure Code(s):    --- Professional ---                       [id number], Bronchoscopy, rigid or flexible, including                        fluoroscopic guidance, when performed; with                        transbronchial lung biopsy(s), single lobe                       31625, 76, Bronchoscopy, rigid or flexible, including                        fluoroscopic guidance, when performed; with bronchial                        or endobronchial biopsy(s), single or multiple sites                       99152, Moderate sedation services provided by the same                        physician or other qualified health care professional                        performing the diagnostic or therapeutic service that                        the sedation supports, requiring the presence of an                        independent trained observer to assist in the                        monitoring of the patient's level of consciousness and                        physiological status; initial 15 minutes of                        intraservice time, patient age 53 years or older  [address], Moderate sedation services; each additional 15                        minutes intraservice time Diagnosis Code(s):    --- Professional ---                       R91.8, Other nonspecific abnormal finding of lung field                       J18.9, Pneumonia, unspecified organism                       J98.9, Respiratory disorder, unspecified CPT copyright 2016 [employer]. All rights reserved. The codes documented in this report are preliminary and upon coder review may  be revised to meet current compliance requirements. Collene Gobble, MD Collene Gobble, MD 01/29/2016 11:53:28 AM Number of Addenda: 0

## 2016-01-29 NOTE — Progress Notes (Signed)
Video bronchoscopy performed.  Intervention bronchial biopsy.  No complications noted.  Patient tolerated procedure well.  Will continue to monitor.

## 2016-01-29 NOTE — H&P (View-Only) (Signed)
Name: [name] Chapman MRN: [id number] DOB: 1960-06-05    ADMISSION DATE:  01/27/2016 CONSULTATION DATE:  01/27/16  REFERRING MD :  Hal Hope  CHIEF COMPLAINT:  L leg pain   HISTORY OF PRESENT ILLNESS:  [name] Chapman is a 55 y.o. male with a significant past medical history.  He presented to [name] ED on 01/27/16 with left LE pain. He states that symptoms began one year ago and gradually began to get worse. Now he has got to the point where he cannot even walk up a flight of stairs and even walking on flat surfaces, he has to pause to take breaks in order to get relief. He has been using Tylenol and BC powder without much relief. He has never had similar symptoms in the past. He is fairly active at baseline and he works in a [employer], [employer]/video. He has not had any numbness or tingling in the foot, simply burning pain.  Upon further questioning, he also reports a roughly 30 pound weight loss in the past 2 months. This has been unintentional. He has had normal appetite has been eating and drinking normally. He is a current every day smoker, smoked roughly 1 pack per day and has been for the past 30+years.  He has not noted any changes to his stools and he has not had any screening colonoscopy.  In ED, he had ABI which demonstrated normal findings on the right however severe arterial disease on the left. CXR revealed RLL opacity versus mass. This was followed up by CTA of the chest which demonstrated a large 9 x 13.2 x 13.9 cm right lower lobe lung mass was contiguous with the hilum, along with mediastinal lymphadenopathy and small right pleural effusion. PCCM was asked to see for consideration of FOB. He denies any chest pain, cough, SOB, hemoptysis.  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   SUBJECTIVE:   Feeling a bit better today  VITAL SIGNS: Temp:  [98.4 F  (36.9 C)-99.6 F (37.6 C)] 99.6 F (37.6 C) (11/16 1344) Pulse Rate:  [71-105] 105 (11/16 1344) Resp:  [18] 18 (11/16 1344) BP: (107-132)/(62-88) 109/66 (11/16 1344) SpO2:  [94 %-100 %] 96 % (11/16 1344) Weight:  [57.3 kg (126 lb 5.2 oz)] 57.3 kg (126 lb 5.2 oz) (11/15 2155)  PHYSICAL EXAMINATION: General: Middle aged male, resting in bed, in NAD. Neuro: A&O x 3, non-focal.  HEENT: [place]/AT. PERRL, sclerae anicteric. Cardiovascular: RRR, no M/R/G.  Lungs: Respirations unlabored.  Clear bilaterally, slightly diminished in right base. Abdomen: BS x 4, soft, NT/ND.  Musculoskeletal: No gross deformities, no edema.  Skin: Intact, warm, no rashes.     Recent Labs Lab 01/27/16 1232 01/28/16 0608  NA 135 133*  K 4.6 4.3  CL 106 102  CO2 22 22  BUN 9 7  CREATININE 0.66 0.59*  GLUCOSE 91 87    Recent Labs Lab 01/27/16 1232 01/28/16 0608  HGB 7.4* 7.0*  HCT 23.9* 23.3*  WBC 17.0* 16.6*  PLT 737* 760*   Dg Chest 2 View  Result Date: 01/27/2016 CLINICAL DATA:  Intermittent left lower extremity pain after the last year. 30 pound weight loss over the last 2 months. EXAM: CHEST  2 VIEW COMPARISON:  Chest CT 06/16/2007. FINDINGS: Heart size is normal. Mediastinal shadows are normal. Left lung is clear. There is consolidation and collapse in the right  lower lobe. Right upper lobe is clear. Some volume loss in the right middle lobe. There is some pleural density on the right laterally. IMPRESSION: Consolidation and volume loss in the right lower lobe. Mild volume loss in the right middle lobe. Pleural density on the right laterally. Findings could be due to chronic pneumonia with volume loss, but the possibility of malignant disease does exist. Chest CT with contrast suggested when able. Electronically Signed   By: Nelson Chimes M.D.   On: 01/27/2016 12:59   Ct Angio Chest/abd/pel For Dissection W And/or W/wo  Result Date: 01/27/2016 CLINICAL DATA:  Intermittent LEFT lower extremity  pain for 1 year and intermittent foot numbness. 30 pound weight loss in 2 months. Follow-up possible chest malignancy. EXAM: CT ANGIOGRAPHY CHEST, ABDOMEN AND PELVIS TECHNIQUE: Multidetector CT imaging through the chest, abdomen and pelvis was performed using the standard protocol during bolus administration of intravenous contrast. Multiplanar reconstructed images and MIPs were obtained and reviewed to evaluate the vascular anatomy. CONTRAST:  80 cc Isovue 370 COMPARISON:  Chest radiograph January 27, 2016 at 1238 hours FINDINGS: CTA CHEST FINDINGS CARDIOVASCULAR: Thoracic aorta is normal course and caliber. No intrinsic density on noncontrast CT. Homogeneous contrast opacification of thoracic aorta without dissection, aneurysm, luminal irregularity, periaortic fluid collections, or contrast extravasation. Trace coronary artery calcifications. Heart size is normal. No pericardial effusion. MEDIASTINUM/NODES: 2.5 mm sub carinal nodal conglomeration. 12 mm RIGHT anterior mediastinal/ prevascular necrotic lymph node. RIGHT hilar lymphadenopathy contiguous with RIGHT lung mass. LUNGS/PLEURA: 9.1 x 13.2 x 13.9 cm RIGHT lung base mass contiguous with the RIGHT hilum, and is contiguous with the diaphragm and pleura. Effaced RIGHT lower lobe bronchi. RIGHT hilar lymphadenopathy narrows the RIGHT middle lobe bronchi. Mass encases the RIGHT lower lobe pulmonary arteries which are patent. No chest wall invasion. Patchy ground-glass nodules and opacities RIGHT lower lobe. LEFT lung base dependent atelectasis. Mild centrilobular emphysema. 3 mm RIGHT middle lobe pulmonary nodule versus granuloma. MUSCULOSKELETAL: Nonsuspicious. Sub cm LEFT thyroid nodule below size followup recommendations. Review of the MIP images confirms the above findings. CTA ABDOMEN AND PELVIS FINDINGS ARTERIES: Abdominal aorta is normal course and caliber. Mild calcific atherosclerosis. 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 Reconstitution of distal LEFT external iliac artery. Reconstitution of distal LEFT internal iliac arteries. Homogeneous contrast opacification of the aorta vessels without dissection, aneurysm, luminal irregularity, periaortic fluid collections, or contrast extravasation. Celiac axis, superior and inferior mesenteric arteries are patent. Moderate stenosis inferior mesenteric artery origin. HEPATOBILIARY: Liver and gallbladder are normal. PANCREAS: Normal. SPLEEN: Normal. ADRENALS/URINARY TRACT: Kidneys are orthotopic, demonstrating symmetric enhancement. No nephrolithiasis, hydronephrosis or solid renal masses. 4.8 cm RIGHT interpolar benign-appearing cyst. The unopacified ureters are normal in course and caliber. Urinary bladder is well distended and unremarkable. Normal adrenal glands. STOMACH/BOWEL: The stomach, small and large bowel are normal in course and caliber without inflammatory changes decreased sensitivity without enteric contrast. Normal appendix. VASCULAR/LYMPHATIC: No lymphadenopathy by CT size criteria. REPRODUCTIVE: Normal. OTHER: No intraperitoneal free fluid or free air. MUSCULOSKELETAL: Nonacute. Bridging sacroiliac osteophytes. Nondisplaced RIGHT L3, L4 healing transverse process fractures. Review of the MIP images confirms the above findings. IMPRESSION: CTA CHEST: 9 x 1 x 13.2 x 13.9 cm RIGHT lung mass contiguous with the hilum compatible with neoplasm, primary lung versus lymphoma. Mediastinal lymphadenopathy. RIGHT lower lobe probable postobstructive pneumonia. Small RIGHT pleural effusion. Moderate emphysema. No acute vascular process. 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 Reconstitution  distal LEFT internal external iliac arteries. Mild atherosclerosis. Acute to subacute nondisplaced RIGHT L3 and L4 transverse process fractures appear traumatic, not pathologic. Acute findings discussed with and reconfirmed by  Dr.WARREN JONES on 01/27/2016 at 5:45 pm. Electronically Signed   By: Elon Alas M.D.   On: 01/27/2016 17:50    STUDIES:  CTA chest 11/15 > large 9 x 13.2 x 13.9 cm right lower lobe lung mass was contiguous with the hilum compatible with neoplasm, primary lung versus lymphoma.  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 ABI 11/15 > normal findings on the right however severe arterial disease on the left.  SIGNIFICANT EVENTS  11/15 >admitted with left leg pain due to severe peripheral arterial disease. Additionally, had incidental finding of a large right lung mass.  ASSESSMENT / PLAN:  Large right lower lobe lung mass - incidental finding in a patient with roughly 30-pack-year smoking history along with his history of unintentional weight loss, making this highly suspicious for malignancy. Mild emphysema. Plan: Discussed CT scan findings with the patient. I believe that bronchoscopy should be high yield, suspect that there is an endobronchial lesion. Will arrange for 11:00 on 11/17.  NPO at MN, stop heparin Will likely also need further imaging for staging purposes. Oncology following DuoNebs / Albuterol PRN.  Baltazar Apo, MD, PhD 01/28/2016, 2:14 PM [facility] [phone] or if no answer [phone]

## 2016-01-30 DIAGNOSIS — I739 Peripheral vascular disease, unspecified: Secondary | ICD-10-CM | POA: Diagnosis present

## 2016-01-30 DIAGNOSIS — F1010 Alcohol abuse, uncomplicated: Secondary | ICD-10-CM | POA: Diagnosis present

## 2016-01-30 DIAGNOSIS — Z720 Tobacco use: Secondary | ICD-10-CM | POA: Diagnosis present

## 2016-01-30 DIAGNOSIS — J189 Pneumonia, unspecified organism: Secondary | ICD-10-CM | POA: Diagnosis present

## 2016-01-30 DIAGNOSIS — S32009A Unspecified fracture of unspecified lumbar vertebra, initial encounter for closed fracture: Secondary | ICD-10-CM | POA: Diagnosis present

## 2016-01-30 LAB — CBC WITH DIFFERENTIAL/PLATELET
BASOS ABS: 0 10*3/uL (ref 0.0–0.1)
Basophils Relative: 0 %
EOS ABS: 0.2 10*3/uL (ref 0.0–0.7)
Eosinophils Relative: 1 %
HEMATOCRIT: 25.3 % — AB (ref 39.0–52.0)
Hemoglobin: 7.9 g/dL — ABNORMAL LOW (ref 13.0–17.0)
LYMPHS ABS: 1.7 10*3/uL (ref 0.7–4.0)
Lymphocytes Relative: 10 %
MCH: 20.8 pg — ABNORMAL LOW (ref 26.0–34.0)
MCHC: 31.2 g/dL (ref 30.0–36.0)
MCV: 66.6 fL — ABNORMAL LOW (ref 78.0–100.0)
MONOS PCT: 12 %
Monocytes Absolute: 2 10*3/uL — ABNORMAL HIGH (ref 0.1–1.0)
NEUTROS ABS: 13.1 10*3/uL — AB (ref 1.7–7.7)
Neutrophils Relative %: 77 %
Platelets: 730 10*3/uL — ABNORMAL HIGH (ref 150–400)
RBC: 3.8 MIL/uL — ABNORMAL LOW (ref 4.22–5.81)
RDW: 18.6 % — AB (ref 11.5–15.5)
WBC: 17 10*3/uL — ABNORMAL HIGH (ref 4.0–10.5)

## 2016-01-30 LAB — BASIC METABOLIC PANEL
Anion gap: 9 (ref 5–15)
BUN: 10 mg/dL (ref 6–20)
CALCIUM: 8.7 mg/dL — AB (ref 8.9–10.3)
CO2: 23 mmol/L (ref 22–32)
CREATININE: 0.48 mg/dL — AB (ref 0.61–1.24)
Chloride: 100 mmol/L — ABNORMAL LOW (ref 101–111)
GFR calc non Af Amer: >60 mL/min (ref >60)
GLUCOSE: 93 mg/dL (ref 65–99)
Potassium: 4.3 mmol/L (ref 3.5–5.1)
Sodium: 132 mmol/L — ABNORMAL LOW (ref 135–145)

## 2016-01-30 MED ORDER — IPRATROPIUM-ALBUTEROL 0.5-2.5 (3) MG/3ML IN SOLN: 3.0000 mL | Freq: Four times a day (QID) | RESPIRATORY_TRACT | Status: DC | PRN

## 2016-01-30 MED ORDER — GADOBENATE DIMEGLUMINE 529 MG/ML IV SOLN
13.0000 mL | Freq: Once | INTRAVENOUS | Status: AC | PRN
  Administered 2016-01-30: 13 mL via INTRAVENOUS

## 2016-01-30 NOTE — Progress Notes (Signed)
PROGRESS NOTE    [name] Chapman  DXA:[id number] DOB: 1961-03-13 DOA: 01/27/2016 PCP: No PCP Per Patient, will need to set up with [facility] on discharge.   Brief Narrative:  [name] Chapman is a 55 y.o. male with long-standing history of tobacco use, presented to the hospital for evaluation of claudication pain involving his left lower extremity. This was evaluated by a CT angiogram of the chest, abdomen/pelvis which demonstrated a large right lung mass. Patient was subsequently admitted to the hospital for further evaluation and treatment.   Assessment & Plan:   Principal Problem:   Mass of right lung Active Problems:   Leg pain, inferior, left   Microcytic hypochromic anemia   Lung mass   Protein-calorie malnutrition, severe   Postobstructive pneumonia   PVD (peripheral vascular disease) (HCC)   Lumbar transverse process fracture (HCC)   Tobacco abuse   Alcohol abuse   Right lower lobe lung mass: Long-standing history of tobacco use. 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 Pulm and IR consulted. Bronchoscopy 11/17. Biopsy pending. MRI brain completed which was negative. Will need outpatient PET/CT. Oncology follow up with Dr. Irene Limbo in 1 week.   Sepsis secondary to postobstructive pneumonia: Fever overnight. Could be secondary to stress post-op, but will check blood cultures today. No complaints of SOB, dysuria, or subjective fevers, chills. Continue Unasyn.  Peripheral vascular disease with left leg claudication pain: Patient has had claudication involving his left lower extremity for approximately 1 year, he can now only walk approximately 100 feet before he starts having pain. 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 Vascular surgery consulted who recommend aspirin daily, statin as well as angiography once medical issues stable.   Right L3/L4 transverse process  fracture: Supportive care. No significant trauma history. ?Pathologic fracture.   Anemia: No history of obvious blood loss. High suspicion for anemia due to malignancy. 1u pRBC transfused 11/16. Trend.   Tobacco abuse: Counseled, continue transdermal nicotine.  EtOH use: Drinks approximately 40-80 ounces of beer daily. Last drink on 11/15. No signs of withdrawal at present. Continue Ativan per protocol  Severe protein calorie malnutrition: Continue supplements   DVT prophylaxis: SCDs Code Status: Full Family Communication: family at bedside Disposition Plan: Pending further workup and treatment   Consultants:   Pulmonology  Oncology  IR   Vascular sx   Procedures:   Bronchoscopy 11/17   Antimicrobials:   Unasyn 11/16 >>     Subjective: Patient doing well after bronchoscopy yesterday. Has no complaints. Denies any subjective fevers or chills, no cough, chest pain or shortness of breath, no nausea, vomiting or diarrhea or abdominal pain, and no dysuria. Essentially asymptomatic although he did have a fever yesterday afternoon. We discussed his MRI findings   Objective: Vitals:   01/29/16 2023 01/29/16 2200 01/30/16 0607 01/30/16 0754  BP: 117/66  112/66   Pulse: (!) 110 (!) 102 99   Resp: 18  16   Temp: (!) 101.4 F (38.6 C) 99.3 F (37.4 C) 98.5 F (36.9 C)   TempSrc: Oral Oral Oral   SpO2: 95%  99% 99%  Weight:      Height:        Intake/Output Summary (Last 24 hours) at 01/30/16 1123 Last data filed at 01/30/16 0450  Gross per 24 hour  Intake           177.33 ml  Output  1075 ml  Net          -897.67 ml   Filed Weights   01/27/16 2155 01/29/16 1005  Weight: 57.3 kg (126 lb 5.2 oz) 57.3 kg (126 lb 5.2 oz)    Examination:  General exam: Appears calm and comfortable, Thin, elderly male Respiratory system: Diminished breath sounds bilaterally, Respiratory effort normal. Cardiovascular system: S1 & S2 heard, RRR. No JVD, murmurs, rubs,  gallops or clicks. No pedal edema. +1 Pedal  pulse on right, no pedal pulses appreciated on left,  Gastrointestinal system: Abdomen is nondistended, soft and nontender. No organomegaly or masses felt. Normal bowel sounds heard. Central nervous system: Alert and oriented. No focal neurological deficits. Extremities: Symmetric 5 x 5 power. Warm to palpation.  Skin: No rashes, lesions or ulcers Psychiatry: Judgement and insight appear normal. Mood & affect appropriate.   Data Reviewed: I have personally reviewed following labs and imaging studies  CBC:  Recent Labs Lab 01/27/16 1232 01/28/16 0608 01/28/16 1801 01/29/16 0651 01/30/16 0535  WBC 17.0* 16.6* 17.3* 16.5* 17.0*  NEUTROABS 12.6* 12.9*  --   --  13.1*  HGB 7.4* 7.0* 7.8* 8.2* 7.9*  HCT 23.9* 23.3* 24.9* 26.1* 25.3*  MCV 65.3* 64.9* 66.4* 66.1* 66.6*  PLT 737* 760* 720* 721* 101*   Basic Metabolic Panel:  Recent Labs Lab 01/27/16 1232 01/28/16 0608 01/29/16 0651 01/30/16 0535  NA 135 133* 134* 132*  K 4.6 4.3 4.5 4.3  CL 106 102 101 100*  CO2 '22 22 25 23  '$ GLUCOSE 91 87 92 93  BUN '9 7 9 10  '$ CREATININE 0.66 0.59* 0.54* 0.48*  CALCIUM 8.5* 8.6* 8.6* 8.7*   GFR: Estimated Creatinine Clearance: 84.6 mL/min (by C-G formula based on SCr of 0.48 mg/dL (L)). Liver Function Tests:  Recent Labs Lab 01/27/16 1232 01/28/16 0608  AST 20 15  ALT 13* 10*  ALKPHOS 289* 283*  BILITOT 0.2* 0.3  PROT 7.9 7.8  ALBUMIN 1.9* 1.9*   No results for input(s): LIPASE, AMYLASE in the last 168 hours. No results for input(s): AMMONIA in the last 168 hours. Coagulation Profile:  Recent Labs Lab 01/28/16 0608  INR 1.35   Cardiac Enzymes: No results for input(s): CKTOTAL, CKMB, CKMBINDEX, TROPONINI in the last 168 hours. BNP (last 3 results) No results for input(s): PROBNP in the last 8760 hours. HbA1C: No results for input(s): HGBA1C in the last 72 hours. CBG: No results for input(s): GLUCAP in the last 168 hours. Lipid  Profile: No results for input(s): CHOL, HDL, LDLCALC, TRIG, CHOLHDL, LDLDIRECT in the last 72 hours. Thyroid Function Tests: No results for input(s): TSH, T4TOTAL, FREET4, T3FREE, THYROIDAB in the last 72 hours. Anemia Panel:  Recent Labs  01/28/16 0019  VITAMINB12 211  FOLATE 7.1  FERRITIN 651*  TIBC 176*  IRON 7*  RETICCTPCT 1.2   Sepsis Labs: No results for input(s): PROCALCITON, LATICACIDVEN in the last 168 hours.  No results found for this or any previous visit (from the past 240 hour(s)).     Radiology Studies: Mr Jeri Cos BP Contrast  Result Date: 01/30/2016 CLINICAL DATA:  Newly diagnosed lung cancer. Evaluation for intracranial metastatic disease. EXAM: MRI HEAD WITHOUT AND WITH CONTRAST TECHNIQUE: Multiplanar, multiecho pulse sequences of the brain and surrounding structures were obtained without and with intravenous contrast. CONTRAST:  54m MULTIHANCE GADOBENATE DIMEGLUMINE 529 MG/ML IV SOLN COMPARISON:  Head CT 06/16/2007 FINDINGS: Brain: No acute infarct or intraparenchymal hemorrhage. The midline structures are normal. No focal parenchymal  signal abnormality. No mass lesion or midline shift. No hydrocephalus or extra-axial fluid collection. No contrast-enhancing lesions. Vascular: Major intracranial arterial and venous sinus flow voids are preserved. No evidence of chronic microhemorrhage or amyloid angiopathy. Skull and upper cervical spine: The visualized skull base, calvarium, upper cervical spine and extracranial soft tissues are normal. Sinuses/Orbits: Complete opacification of the left maxillary sinus. Small amount of bilateral mastoid fluid. Normal orbits. IMPRESSION: No intracranial metastatic disease. Electronically Signed   By: Ulyses Jarred M.D.   On: 01/30/2016 00:40   Dg Chest Port 1 View  Result Date: 01/29/2016 CLINICAL DATA:  Status post bronchoscopic biopsy. EXAM: PORTABLE CHEST 1 VIEW COMPARISON:  Chest x-ray and chest CT 01/27/2016 FINDINGS: Large  right lower lobe lung mass is again demonstrated with right hilar adenopathy. No postprocedural pneumothorax is identified. The left lung remains clear. IMPRESSION: Stable large right lower lobe lung mass. No postprocedural pneumothorax. Electronically Signed   By: Marijo Sanes M.D.   On: 01/29/2016 12:26      Scheduled Meds: . feeding supplement (ENSURE ENLIVE)  237 mL Oral BID BM  . folic acid  1 mg Oral Daily  . ipratropium-albuterol  3 mL Nebulization BID  . LORazepam  0-4 mg Intravenous Q12H  . multivitamin with minerals  1 tablet Oral Daily  . nicotine  14 mg Transdermal QHS  . thiamine  100 mg Oral Daily   Or  . thiamine  100 mg Intravenous Daily   Continuous Infusions: . sodium chloride 10 mL (01/29/16 1015)     LOS: 2 days    Time spent: 30 minutes   Dessa Phi, DO [facility] [email or web address] Password [email or web address] 01/30/2016, 11:23 AM

## 2016-01-30 NOTE — Progress Notes (Signed)
Pt temp 102.7, Tylenol given, MD notified. Will continue to monitor.

## 2016-01-31 ENCOUNTER — Inpatient Hospital Stay (HOSPITAL_COMMUNITY): Payer: Medicaid Other

## 2016-01-31 DIAGNOSIS — M79609 Pain in unspecified limb: Secondary | ICD-10-CM

## 2016-01-31 DIAGNOSIS — R42 Dizziness and giddiness: Secondary | ICD-10-CM

## 2016-01-31 LAB — CBC WITH DIFFERENTIAL/PLATELET
BASOS PCT: 0 %
Basophils Absolute: 0 10*3/uL (ref 0.0–0.1)
EOS ABS: 0.2 10*3/uL (ref 0.0–0.7)
EOS PCT: 1 %
HCT: 26.8 % — ABNORMAL LOW (ref 39.0–52.0)
Hemoglobin: 8.4 g/dL — ABNORMAL LOW (ref 13.0–17.0)
LYMPHS ABS: 1.6 10*3/uL (ref 0.7–4.0)
Lymphocytes Relative: 10 %
MCH: 21 pg — AB (ref 26.0–34.0)
MCHC: 31.3 g/dL (ref 30.0–36.0)
MCV: 67 fL — AB (ref 78.0–100.0)
MONO ABS: 2.1 10*3/uL — AB (ref 0.1–1.0)
Monocytes Relative: 13 %
Neutro Abs: 12.1 10*3/uL — ABNORMAL HIGH (ref 1.7–7.7)
Neutrophils Relative %: 76 %
PLATELETS: 688 10*3/uL — AB (ref 150–400)
RBC: 4 MIL/uL — ABNORMAL LOW (ref 4.22–5.81)
RDW: 19.3 % — AB (ref 11.5–15.5)
WBC: 16 10*3/uL — AB (ref 4.0–10.5)

## 2016-01-31 LAB — BASIC METABOLIC PANEL
Anion gap: 10 (ref 5–15)
BUN: 10 mg/dL (ref 6–20)
CALCIUM: 8.7 mg/dL — AB (ref 8.9–10.3)
CO2: 23 mmol/L (ref 22–32)
CREATININE: 0.61 mg/dL (ref 0.61–1.24)
Chloride: 97 mmol/L — ABNORMAL LOW (ref 101–111)
GFR calc Af Amer: >60 mL/min (ref >60)
GLUCOSE: 113 mg/dL — AB (ref 65–99)
Potassium: 4.3 mmol/L (ref 3.5–5.1)
SODIUM: 130 mmol/L — AB (ref 135–145)

## 2016-01-31 MED ORDER — SODIUM CHLORIDE 0.9 % IV SOLN
1.5000 g | Freq: Four times a day (QID) | INTRAVENOUS | Status: DC
  Administered 2016-01-31 – 2016-02-01 (×4): 1.5 g via INTRAVENOUS
  Filled 2016-01-31 (×5): qty 1.5

## 2016-01-31 NOTE — Progress Notes (Signed)
PROGRESS NOTE    [name] Chapman  IDP:[id number] DOB: 11/24/1960 DOA: 01/27/2016 PCP: No PCP Per Patient, will need to set up with [facility] on discharge.   Brief Narrative:  [name] Chapman is a 55 y.o. male with long-standing history of tobacco use, presented to the hospital for evaluation of claudication pain involving his left lower extremity. This was evaluated by a CT angiogram of the chest, abdomen/pelvis which demonstrated a large right lung mass. Patient was subsequently admitted to the hospital for further evaluation and treatment.   Assessment & Plan:   Principal Problem:   Mass of right lung Active Problems:   Leg pain, inferior, left   Microcytic hypochromic anemia   Lung mass   Protein-calorie malnutrition, severe   Postobstructive pneumonia   PVD (peripheral vascular disease) (HCC)   Lumbar transverse process fracture (HCC)   Tobacco abuse   Alcohol abuse  Right lower lobe lung mass: Long-standing history of tobacco use. 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 Pulm and IR consulted. Bronchoscopy 11/17. Biopsy pending. MRI brain completed which was negative. Will need outpatient PET/CT. Oncology follow up with Dr. Irene Limbo in 1 week.   Sepsis secondary to postobstructive pneumonia: Continues to have intermittent fevers. Most likely due to postobstructive pneumonia but will check US duplex w/o DVT. Unasyn was actually not started previously. Start Unasyn today. Blood cultures pending.   Peripheral vascular disease with left leg claudication pain: Patient has had claudication involving his left lower extremity for approximately 1 year, he can now only walk approximately 100 feet before he starts having pain. 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 Vascular surgery consulted who recommend aspirin daily, statin as well as angiography once medical issues stable.    Right L3/L4 transverse process fracture: Supportive care. No significant trauma history.   Anemia: No history of obvious blood loss. High suspicion for anemia due to malignancy. 1u pRBC transfused 11/16. Trend.  Thrombocytosis: Likely secondary to acute illness and likely malignancy as above, trend CBC    Tobacco abuse: Counseled, continue transdermal nicotine.  EtOH use: Drinks approximately 40-80 ounces of beer daily. Last drink on 11/15. No signs of withdrawal at present. Continue Ativan per protocol  Severe protein calorie malnutrition: Continue supplements   DVT prophylaxis: SCDs Code Status: Full Family Communication: family at bedside Disposition Plan: Pending further treatment and trending fever   Consultants:   Pulmonology  Oncology  IR   Vascular sx   Procedures:   Bronchoscopy 11/17   Antimicrobials:   Unasyn 11/19 >>     Subjective: Patient has no complaints today. Denies any headaches, chest pain, shortness of breath or cough. No dysuria, peripheral swelling or pain. He does have some numbness and tingling in his left lower extremity consistent with his PVD.  Objective: Vitals:   01/30/16 2251 01/31/16 0000 01/31/16 0018 01/31/16 0554  BP:   107/65 102/66  Pulse:   92 99  Resp:   14 16  Temp: 100.1 F (37.8 C) 99.6 F (37.6 C)  98.6 F (37 C)  TempSrc: Oral Oral  Oral  SpO2:   95% 95%  Weight:      Height:        Intake/Output Summary (Last 24 hours) at 01/31/16 0716 Last data filed at 01/31/16 0554  Gross per 24 hour  Intake              640 ml  Output             1600 ml  Net             -960 ml   Filed Weights   01/27/16 2155 01/29/16 1005  Weight: 57.3 kg (126 lb 5.2 oz) 57.3 kg (126 lb 5.2 oz)    Examination:  General exam: Appears calm and comfortable, Thin, elderly male Respiratory system: Diminished breath sounds bilaterally, Respiratory effort normal. Cardiovascular system: S1 & S2 heard, RRR. No JVD, murmurs,  rubs, gallops or clicks. No pedal edema. +1 Pedal  pulse on right, no pedal pulses appreciated on left,  Gastrointestinal system: Abdomen is nondistended, soft and nontender. No organomegaly or masses felt. Normal bowel sounds heard. Central nervous system: Alert and oriented. No focal neurological deficits. Extremities: Symmetric 5 x 5 power. Warm to palpation.  Skin: No rashes, lesions or ulcers Psychiatry: Judgement and insight appear normal. Mood & affect appropriate.   Data Reviewed: I have personally reviewed following labs and imaging studies  CBC:  Recent Labs Lab 01/27/16 1232 01/28/16 0608 01/28/16 1801 01/29/16 0651 01/30/16 0535  WBC 17.0* 16.6* 17.3* 16.5* 17.0*  NEUTROABS 12.6* 12.9*  --   --  13.1*  HGB 7.4* 7.0* 7.8* 8.2* 7.9*  HCT 23.9* 23.3* 24.9* 26.1* 25.3*  MCV 65.3* 64.9* 66.4* 66.1* 66.6*  PLT 737* 760* 720* 721* 967*   Basic Metabolic Panel:  Recent Labs Lab 01/27/16 1232 01/28/16 0608 01/29/16 0651 01/30/16 0535  NA 135 133* 134* 132*  K 4.6 4.3 4.5 4.3  CL 106 102 101 100*  CO2 '22 22 25 23  '$ GLUCOSE 91 87 92 93  BUN '9 7 9 10  '$ CREATININE 0.66 0.59* 0.54* 0.48*  CALCIUM 8.5* 8.6* 8.6* 8.7*   GFR: Estimated Creatinine Clearance: 84.6 mL/min (by C-G formula based on SCr of 0.48 mg/dL (L)). Liver Function Tests:  Recent Labs Lab 01/27/16 1232 01/28/16 0608  AST 20 15  ALT 13* 10*  ALKPHOS 289* 283*  BILITOT 0.2* 0.3  PROT 7.9 7.8  ALBUMIN 1.9* 1.9*   No results for input(s): LIPASE, AMYLASE in the last 168 hours. No results for input(s): AMMONIA in the last 168 hours. Coagulation Profile:  Recent Labs Lab 01/28/16 0608  INR 1.35   Cardiac Enzymes: No results for input(s): CKTOTAL, CKMB, CKMBINDEX, TROPONINI in the last 168 hours. BNP (last 3 results) No results for input(s): PROBNP in the last 8760 hours. HbA1C: No results for input(s): HGBA1C in the last 72 hours. CBG: No results for input(s): GLUCAP in the last 168  hours. Lipid Profile: No results for input(s): CHOL, HDL, LDLCALC, TRIG, CHOLHDL, LDLDIRECT in the last 72 hours. Thyroid Function Tests: No results for input(s): TSH, T4TOTAL, FREET4, T3FREE, THYROIDAB in the last 72 hours. Anemia Panel: No results for input(s): VITAMINB12, FOLATE, FERRITIN, TIBC, IRON, RETICCTPCT in the last 72 hours. Sepsis Labs: No results for input(s): PROCALCITON, LATICACIDVEN in the last 168 hours.  No results found for this or any previous visit (from the past 240 hour(s)).     Radiology Studies: Mr Jeri Cos RF Contrast  Result Date: 01/30/2016 CLINICAL DATA:  Newly diagnosed lung cancer. Evaluation for intracranial metastatic disease. EXAM: MRI HEAD WITHOUT AND WITH CONTRAST TECHNIQUE: Multiplanar, multiecho pulse sequences of the brain and surrounding structures were obtained without and with intravenous contrast. CONTRAST:  77m MULTIHANCE GADOBENATE DIMEGLUMINE 529 MG/ML IV SOLN COMPARISON:  Head CT 06/16/2007 FINDINGS: Brain: No acute infarct or intraparenchymal hemorrhage. The midline structures  are normal. No focal parenchymal signal abnormality. No mass lesion or midline shift. No hydrocephalus or extra-axial fluid collection. No contrast-enhancing lesions. Vascular: Major intracranial arterial and venous sinus flow voids are preserved. No evidence of chronic microhemorrhage or amyloid angiopathy. Skull and upper cervical spine: The visualized skull base, calvarium, upper cervical spine and extracranial soft tissues are normal. Sinuses/Orbits: Complete opacification of the left maxillary sinus. Small amount of bilateral mastoid fluid. Normal orbits. IMPRESSION: No intracranial metastatic disease. Electronically Signed   By: Ulyses Jarred M.D.   On: 01/30/2016 00:40   Dg Chest Port 1 View  Result Date: 01/29/2016 CLINICAL DATA:  Status post bronchoscopic biopsy. EXAM: PORTABLE CHEST 1 VIEW COMPARISON:  Chest x-ray and chest CT 01/27/2016 FINDINGS: Large right  lower lobe lung mass is again demonstrated with right hilar adenopathy. No postprocedural pneumothorax is identified. The left lung remains clear. IMPRESSION: Stable large right lower lobe lung mass. No postprocedural pneumothorax. Electronically Signed   By: Marijo Sanes M.D.   On: 01/29/2016 12:26      Scheduled Meds: . feeding supplement (ENSURE ENLIVE)  237 mL Oral BID BM  . folic acid  1 mg Oral Daily  . LORazepam  0-4 mg Intravenous Q12H  . multivitamin with minerals  1 tablet Oral Daily  . nicotine  14 mg Transdermal QHS  . thiamine  100 mg Oral Daily   Or  . thiamine  100 mg Intravenous Daily   Continuous Infusions: . sodium chloride 10 mL (01/29/16 1015)     LOS: 3 days    Time spent: 30 minutes   Dessa Phi, DO [facility] [email or web address] Password [email or web address] 01/31/2016, 7:16 AM

## 2016-01-31 NOTE — Progress Notes (Signed)
*  PRELIMINARY RESULTS* Vascular Ultrasound Lower extremity venous duplex has been completed.  Preliminary findings: No evidence of DVT or baker's cyst.  Landry Mellow, RDMS, RVT  01/31/2016, 2:36 PM

## 2016-01-31 NOTE — Progress Notes (Signed)
Pharmacy Antibiotic Note  [name] Chapman is a 55 y.o. male admitted on 01/27/2016 with claudication pain in left lower extremity and right lower lung mass. Pharmacy has been consulted for Unasyn dosing for postobstructive pneumonia.  Patient has had fevers today up to 102.61F per RN note and WBC 16 k/uL. Small increase in serum creatinine from yesterday (0.48 to 0.61 mg/dL) - will monitor.   Plan: - Unasyn 1.5g IV every 6 hours  - Monitor renal function, C/S, and clinical progress  Height: '5\' 6"'$  (167.6 cm) Weight: 126 lb 5.2 oz (57.3 kg) IBW/kg (Calculated) : 63.8  Temp (24hrs), Avg:100.2 F (37.9 C), Min:98.6 F (37 C), Max:102.7 F (39.3 C)   Recent Labs Lab 01/27/16 1232 01/28/16 0608 01/28/16 1801 01/29/16 0651 01/30/16 0535 01/31/16 0627  WBC 17.0* 16.6* 17.3* 16.5* 17.0* 16.0*  CREATININE 0.66 0.59*  --  0.54* 0.48* 0.61    Estimated Creatinine Clearance: 84.6 mL/min (by C-G formula based on SCr of 0.61 mg/dL).    No Known Allergies  Antimicrobials this admission: 11/19 Unasyn >>   Dose adjustments this admission: N/A  Microbiology results: 11/18 BCx: sent  Thank you for allowing pharmacy to be a part of this patient's care.  Demetrius Charity, PharmD Acute Care Pharmacy Resident  Pager: [phone] 01/31/2016

## 2016-02-01 ENCOUNTER — Telehealth: Payer: Self-pay | Admitting: Surgery

## 2016-02-01 ENCOUNTER — Encounter (HOSPITAL_COMMUNITY): Payer: Self-pay | Admitting: Emergency Medicine

## 2016-02-01 LAB — CBC WITH DIFFERENTIAL/PLATELET
Basophils Absolute: 0 10*3/uL (ref 0.0–0.1)
Basophils Relative: 0 %
EOS PCT: 1 %
Eosinophils Absolute: 0.2 10*3/uL (ref 0.0–0.7)
HCT: 25.7 % — ABNORMAL LOW (ref 39.0–52.0)
Hemoglobin: 7.9 g/dL — ABNORMAL LOW (ref 13.0–17.0)
Lymphocytes Relative: 12 %
Lymphs Abs: 2 10*3/uL (ref 0.7–4.0)
MCH: 20.7 pg — ABNORMAL LOW (ref 26.0–34.0)
MCHC: 30.7 g/dL (ref 30.0–36.0)
MCV: 67.3 fL — AB (ref 78.0–100.0)
MONO ABS: 2.3 10*3/uL — AB (ref 0.1–1.0)
MONOS PCT: 14 %
NEUTROS PCT: 73 %
Neutro Abs: 11.8 10*3/uL — ABNORMAL HIGH (ref 1.7–7.7)
PLATELETS: 689 10*3/uL — AB (ref 150–400)
RBC: 3.82 MIL/uL — AB (ref 4.22–5.81)
RDW: 19.4 % — ABNORMAL HIGH (ref 11.5–15.5)
WBC: 16.3 10*3/uL — AB (ref 4.0–10.5)

## 2016-02-01 LAB — BASIC METABOLIC PANEL
ANION GAP: 10 (ref 5–15)
BUN: 10 mg/dL (ref 6–20)
CO2: 24 mmol/L (ref 22–32)
Calcium: 8.6 mg/dL — ABNORMAL LOW (ref 8.9–10.3)
Chloride: 99 mmol/L — ABNORMAL LOW (ref 101–111)
Creatinine, Ser: 0.55 mg/dL — ABNORMAL LOW (ref 0.61–1.24)
GLUCOSE: 94 mg/dL (ref 65–99)
POTASSIUM: 4.3 mmol/L (ref 3.5–5.1)
Sodium: 133 mmol/L — ABNORMAL LOW (ref 135–145)

## 2016-02-01 MED ORDER — VANCOMYCIN HCL IN DEXTROSE 1-5 GM/200ML-% IV SOLN
1000.0000 mg | Freq: Once | INTRAVENOUS | Status: AC
  Administered 2016-02-01: 1000 mg via INTRAVENOUS
  Filled 2016-02-01: qty 200

## 2016-02-01 MED ORDER — PIPERACILLIN-TAZOBACTAM 3.375 G IVPB
3.3750 g | Freq: Three times a day (TID) | INTRAVENOUS | Status: DC
  Administered 2016-02-01 – 2016-02-04 (×10): 3.375 g via INTRAVENOUS
  Filled 2016-02-01 (×12): qty 50

## 2016-02-01 MED ORDER — VANCOMYCIN HCL 500 MG IV SOLR
500.0000 mg | Freq: Three times a day (TID) | INTRAVENOUS | Status: DC
  Administered 2016-02-01 – 2016-02-03 (×5): 500 mg via INTRAVENOUS
  Filled 2016-02-01 (×8): qty 500

## 2016-02-01 MED ORDER — SODIUM CHLORIDE 0.9 % IV BOLUS (SEPSIS)
500.0000 mL | Freq: Once | INTRAVENOUS | Status: AC
  Administered 2016-02-01: 500 mL via INTRAVENOUS

## 2016-02-01 NOTE — Progress Notes (Signed)
Dr. Hilbert Bible notified patient temp 102.61F oral. Currently on iv vanc and zosyn, blood cx negative on 11/18, tylenol to be given. Awaiting response.

## 2016-02-01 NOTE — Care Management Note (Signed)
Case Management Note  Patient Details  Name: SEVON ROTERT MRN: [id number] Date of Birth: 21-Jul-1960  Subjective/Objective:     Admittedon 11/15/2017with claudication pain in left lower extremity and right lower lung mass. Hx of tobacco abuse. Lives with wife. States PTA  independent with ADL's  and no usage of assistive devices. Pt without medical insurance. CM shared with pt [place] information for possible f/u post hospitalization.         Action/Plan: - Lung bx results and blood cultures pending Plan is to d/c to home when medically stable. CM to f/u with d/c disposition.   Expected Discharge Date:                  Expected Discharge Plan:  Home/Self Care  In-House Referral:     Discharge planning Services  CM Consult  Post Acute Care Choice:    Choice offered to:     DME Arranged:    DME Agency:     HH Arranged:    HH Agency:     Status of Service:  In process, will continue to follow  If discussed at Long Length of Stay Meetings, dates discussed:    Additional Comments:  Sharin Mons, RN 02/01/2016, 9:43 AM

## 2016-02-01 NOTE — Progress Notes (Signed)
Patient states that he feels "down" and "depressed" about hospitalization, wife concerned about this. Offered to sit with patient and discuss this. He declined. Offered for chaplain to come visit patient, he declined.

## 2016-02-01 NOTE — Telephone Encounter (Signed)
LVM on home # for appt on 02/22/16 will mail letter

## 2016-02-01 NOTE — Progress Notes (Signed)
PROGRESS NOTE    [name] Chapman  LOV:[id number] DOB: Feb 12, 1961 DOA: 01/27/2016 PCP: No PCP Per Patient, will need to set up with [facility] on discharge.   Brief Narrative:  [name] Chapman is a 55 y.o. male with long-standing history of tobacco use, presented to the hospital for evaluation of claudication pain involving his left lower extremity. This was evaluated by a CT angiogram of the chest, abdomen/pelvis which demonstrated a large right lung mass. Patient was subsequently admitted to the hospital for further evaluation and treatment.   Assessment & Plan:   Principal Problem:   Mass of right lung Active Problems:   Leg pain, inferior, left   Microcytic hypochromic anemia   Lung mass   Protein-calorie malnutrition, severe   Postobstructive pneumonia   PVD (peripheral vascular disease) (HCC)   Lumbar transverse process fracture (HCC)   Tobacco abuse   Alcohol abuse  Right lower lobe lung mass: Long-standing history of tobacco use. 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 Pulm and IR consulted. Bronchoscopy 11/17. Biopsy results pending. MRI brain completed which was negative. Will need outpatient PET/CT. Oncology follow up with Dr. Irene Limbo in 1 week.   Sepsis secondary to postobstructive pneumonia: Continues to have fevers, leukocytosis. Most likely due to postobstructive pneumonia. US duplex negative for DVT. Blood cultures negative to date. Will broaden coverage to vanco/zosyn today.   Peripheral vascular disease with left leg claudication pain: Patient has had claudication involving his left lower extremity for approximately 1 year, he can now only walk approximately 100 feet before he starts having pain. 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 Vascular surgery consulted who recommend aspirin daily, statin as well as angiography once medical issues stable.   Right L3/L4  transverse process fracture: Supportive care. No significant trauma history.   Normocytic anemia: No history of obvious blood loss. High suspicion for anemia due to malignancy. 1u pRBC transfused 11/16. Stable.   Thrombocytosis: Likely secondary to acute illness and likely malignancy as above, trend CBC    Tobacco abuse: Counseled, continue transdermal nicotine.  EtOH use: Drinks approximately 40-80 ounces of beer daily. Last drink on 11/15. No signs of withdrawal.  Severe protein calorie malnutrition: Continue supplements   DVT prophylaxis: SCDs Code Status: Full Family Communication: family at bedside Disposition Plan: Pending further treatment and trending fever   Consultants:   Pulmonology  Oncology  IR   Vascular sx   Procedures:   Bronchoscopy 11/17   Antimicrobials:   Unasyn 11/19 - 11/20  Vanco, zosyn 11/20 >>    Subjective: No complaints today, states he is feeling well. Continues to have fevers.   Objective: Vitals:   01/31/16 2145 01/31/16 2322 02/01/16 0103 02/01/16 0637  BP: 114/66   106/67  Pulse: (!) 102   93  Resp: 18   18  Temp: (!) 101.4 F (38.6 C) 100.3 F (37.9 C) 98.9 F (37.2 C) 98.5 F (36.9 C)  TempSrc: Oral Oral Oral Oral  SpO2: 100%   100%  Weight:      Height:        Intake/Output Summary (Last 24 hours) at 02/01/16 0758 Last data filed at 02/01/16 0701  Gross per 24 hour  Intake             1040 ml  Output             1000 ml  Net  40 ml   Filed Weights   01/27/16 2155 01/29/16 1005  Weight: 57.3 kg (126 lb 5.2 oz) 57.3 kg (126 lb 5.2 oz)    Examination:  General exam: Appears calm and comfortable, Thin, elderly male Respiratory system: Diminished breath sounds bilaterally, Respiratory effort normal. Cardiovascular system: S1 & S2 heard, RRR. No JVD, murmurs, rubs, gallops or clicks. No pedal edema. +1 Pedal  pulse on right, no pedal pulses appreciated on left,  Gastrointestinal system: Abdomen  is nondistended, soft and nontender. No organomegaly or masses felt. Normal bowel sounds heard. Central nervous system: Alert and oriented. No focal neurological deficits. Extremities: Symmetric 5 x 5 power. Warm to palpation.  Skin: No rashes, lesions or ulcers Psychiatry: Judgement and insight appear normal. Mood & affect appropriate.   Data Reviewed: I have personally reviewed following labs and imaging studies  CBC:  Recent Labs Lab 01/27/16 1232 01/28/16 0608 01/28/16 1801 01/29/16 0651 01/30/16 0535 01/31/16 0627 02/01/16 0427  WBC 17.0* 16.6* 17.3* 16.5* 17.0* 16.0* 16.3*  NEUTROABS 12.6* 12.9*  --   --  13.1* 12.1* 11.8*  HGB 7.4* 7.0* 7.8* 8.2* 7.9* 8.4* 7.9*  HCT 23.9* 23.3* 24.9* 26.1* 25.3* 26.8* 25.7*  MCV 65.3* 64.9* 66.4* 66.1* 66.6* 67.0* 67.3*  PLT 737* 760* 720* 721* 730* 688* 021*   Basic Metabolic Panel:  Recent Labs Lab 01/28/16 0608 01/29/16 0651 01/30/16 0535 01/31/16 0627 02/01/16 0427  NA 133* 134* 132* 130* 133*  K 4.3 4.5 4.3 4.3 4.3  CL 102 101 100* 97* 99*  CO2 '22 25 23 23 24  '$ GLUCOSE 87 92 93 113* 94  BUN '7 9 10 10 10  '$ CREATININE 0.59* 0.54* 0.48* 0.61 0.55*  CALCIUM 8.6* 8.6* 8.7* 8.7* 8.6*   GFR: Estimated Creatinine Clearance: 84.6 mL/min (by C-G formula based on SCr of 0.55 mg/dL (L)). Liver Function Tests:  Recent Labs Lab 01/27/16 1232 01/28/16 0608  AST 20 15  ALT 13* 10*  ALKPHOS 289* 283*  BILITOT 0.2* 0.3  PROT 7.9 7.8  ALBUMIN 1.9* 1.9*   No results for input(s): LIPASE, AMYLASE in the last 168 hours. No results for input(s): AMMONIA in the last 168 hours. Coagulation Profile:  Recent Labs Lab 01/28/16 0608  INR 1.35   Cardiac Enzymes: No results for input(s): CKTOTAL, CKMB, CKMBINDEX, TROPONINI in the last 168 hours. BNP (last 3 results) No results for input(s): PROBNP in the last 8760 hours. HbA1C: No results for input(s): HGBA1C in the last 72 hours. CBG: No results for input(s): GLUCAP in the last  168 hours. Lipid Profile: No results for input(s): CHOL, HDL, LDLCALC, TRIG, CHOLHDL, LDLDIRECT in the last 72 hours. Thyroid Function Tests: No results for input(s): TSH, T4TOTAL, FREET4, T3FREE, THYROIDAB in the last 72 hours. Anemia Panel: No results for input(s): VITAMINB12, FOLATE, FERRITIN, TIBC, IRON, RETICCTPCT in the last 72 hours. Sepsis Labs: No results for input(s): PROCALCITON, LATICACIDVEN in the last 168 hours.  Recent Results (from the past 240 hour(s))  Culture, blood (routine x 2)     Status: None (Preliminary result)   Collection Time: 01/30/16  9:00 AM  Result Value Ref Range Status   Specimen Description BLOOD RIGHT ANTECUBITAL  Final   Special Requests BOTTLES DRAWN AEROBIC AND ANAEROBIC 10CC  Final   Culture NO GROWTH 1 DAY  Final   Report Status PENDING  Incomplete  Culture, blood (routine x 2)     Status: None (Preliminary result)   Collection Time: 01/30/16  9:15 AM  Result Value Ref Range Status   Specimen Description BLOOD RIGHT ANTECUBITAL  Final   Special Requests BOTTLES DRAWN AEROBIC ONLY [place]  Final   Culture NO GROWTH 1 DAY  Final   Report Status PENDING  Incomplete       Radiology Studies: No results found.    Scheduled Meds: . feeding supplement (ENSURE ENLIVE)  237 mL Oral BID BM  . folic acid  1 mg Oral Daily  . multivitamin with minerals  1 tablet Oral Daily  . nicotine  14 mg Transdermal QHS  . thiamine  100 mg Oral Daily   Or  . thiamine  100 mg Intravenous Daily   Continuous Infusions: . sodium chloride 10 mL (01/29/16 1015)     LOS: 4 days    Time spent: 30 minutes   Dessa Phi, DO [facility] [email or web address] Password [email or web address] 02/01/2016, 7:58 AM

## 2016-02-01 NOTE — Progress Notes (Signed)
Pharmacy Antibiotic Note  [name] Chapman is a 55 y.o. male admitted on 01/27/2016 with claudication pain in left lower extremity and right lower lung mass. Pharmacy has been consulted for Vancomycin and Zosyn dosing for postobstructive pneumonia.  Patient had fevers up to 101.31F last night, now afebrile. WBC remains elevated at 16.3. Serum creatinine stable.    Plan: Vancomycin load '1000mg'$  IV x 1, then Vancomycin '500mg'$  IV q8h (Goal 15-20 mcg/mL) Zosyn 3.375g IV q8h (4-hour infusion) Monitor renal function, C/S, and clinical progress Vancomycin trough at steady state.  Height: '5\' 6"'$  (167.6 cm) Weight: 126 lb 5.2 oz (57.3 kg) IBW/kg (Calculated) : 63.8  Temp (24hrs), Avg:99.9 F (37.7 C), Min:98.5 F (36.9 C), Max:101.4 F (38.6 C)   Recent Labs Lab 01/28/16 0608 01/28/16 1801 01/29/16 0651 01/30/16 0535 01/31/16 0627 02/01/16 0427  WBC 16.6* 17.3* 16.5* 17.0* 16.0* 16.3*  CREATININE 0.59*  --  0.54* 0.48* 0.61 0.55*    Estimated Creatinine Clearance: 84.6 mL/min (by C-G formula based on SCr of 0.55 mg/dL (L)).    No Known Allergies  Antimicrobials this admission: Vancomycin 11/20 >>  Zosyn 11/20 >>  Unasyn 11/19 >> 11/20  Dose adjustments this admission: n/a  Microbiology results: 11/18 BCx: ngtd   Thank you for allowing pharmacy to be a part of this patient's care.  Cheri Rous  PharmD Candidate 02/01/2016 8:29 AM

## 2016-02-01 NOTE — Telephone Encounter (Signed)
-----   Message from Mena Goes, RN sent at 01/31/2016  8:07 PM EST ----- Regarding: next available w/ [facility]   ----- Message ----- From: Conrad [place], MD Sent: 01/31/2016   8:38 AM To: [facility] [address]  KAZ AULD [id number] [birth year]  Needs next available follow up with Dr. Trula Slade in 2-4 weeks

## 2016-02-02 LAB — CBC WITH DIFFERENTIAL/PLATELET
BASOS PCT: 0 %
Basophils Absolute: 0 10*3/uL (ref 0.0–0.1)
EOS ABS: 0.2 10*3/uL (ref 0.0–0.7)
EOS PCT: 1 %
HCT: 24.8 % — ABNORMAL LOW (ref 39.0–52.0)
HEMOGLOBIN: 7.6 g/dL — AB (ref 13.0–17.0)
LYMPHS PCT: 9 %
Lymphs Abs: 1.4 10*3/uL (ref 0.7–4.0)
MCH: 20.6 pg — ABNORMAL LOW (ref 26.0–34.0)
MCHC: 30.6 g/dL (ref 30.0–36.0)
MCV: 67.2 fL — ABNORMAL LOW (ref 78.0–100.0)
MONO ABS: 1.7 10*3/uL — AB (ref 0.1–1.0)
Monocytes Relative: 11 %
NEUTROS PCT: 79 %
Neutro Abs: 12 10*3/uL — ABNORMAL HIGH (ref 1.7–7.7)
PLATELETS: 608 10*3/uL — AB (ref 150–400)
RBC: 3.69 MIL/uL — AB (ref 4.22–5.81)
RDW: 19.5 % — ABNORMAL HIGH (ref 11.5–15.5)
WBC: 15.3 10*3/uL — AB (ref 4.0–10.5)

## 2016-02-02 LAB — BASIC METABOLIC PANEL
ANION GAP: 10 (ref 5–15)
BUN: 10 mg/dL (ref 6–20)
CHLORIDE: 98 mmol/L — AB (ref 101–111)
CO2: 22 mmol/L (ref 22–32)
Calcium: 8.5 mg/dL — ABNORMAL LOW (ref 8.9–10.3)
Creatinine, Ser: 0.54 mg/dL — ABNORMAL LOW (ref 0.61–1.24)
GFR calc Af Amer: >60 mL/min (ref >60)
Glucose, Bld: 91 mg/dL (ref 65–99)
POTASSIUM: 4.1 mmol/L (ref 3.5–5.1)
SODIUM: 130 mmol/L — AB (ref 135–145)

## 2016-02-02 MED ORDER — SODIUM CHLORIDE 0.9 % IV BOLUS (SEPSIS)
500.0000 mL | Freq: Once | INTRAVENOUS | Status: AC
  Administered 2016-02-02: 500 mL via INTRAVENOUS

## 2016-02-02 MED ORDER — IBUPROFEN 400 MG PO TABS
400.0000 mg | ORAL_TABLET | Freq: Once | ORAL | Status: AC
  Administered 2016-02-02: 400 mg via ORAL
  Filled 2016-02-02: qty 1

## 2016-02-02 NOTE — Progress Notes (Signed)
PROGRESS NOTE    [name] Chapman  MPN:[id number] DOB: 03-07-1961 DOA: 01/27/2016 PCP: No PCP Per Patient, will need to set up with [facility] on discharge.   Brief Narrative:  [name] Chapman is a 55 y.o. male with long-standing history of tobacco use, presented to the hospital for evaluation of claudication pain involving his left lower extremity. This was evaluated by a CT angiogram of the chest, abdomen/pelvis which demonstrated a large right lung mass. Patient was subsequently admitted to the hospital for further evaluation and treatment.   Assessment & Plan:   Principal Problem:   Mass of right lung Active Problems:   Leg pain, inferior, left   Microcytic hypochromic anemia   Lung mass   Protein-calorie malnutrition, severe   Postobstructive pneumonia   PVD (peripheral vascular disease) (HCC)   Lumbar transverse process fracture (HCC)   Tobacco abuse   Alcohol abuse  Right lower lobe lung mass: Long-standing history of tobacco use. 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 Pulm and IR consulted. Bronchoscopy 11/17. Biopsy results pending. MRI brain completed which was negative. Will need outpatient PET/CT. Oncology follow up with Dr. Irene Limbo in 1 week.   Sepsis secondary to postobstructive pneumonia: Continues to have fevers, leukocytosis. Most likely due to postobstructive pneumonia. US duplex negative for DVT. Blood cultures negative to date. Will broaden coverage to vanco/zosyn.   Peripheral vascular disease with left leg claudication pain: Patient has had claudication involving his left lower extremity for approximately 1 year, he can now only walk approximately 100 feet before he starts having pain. 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 Vascular surgery consulted who recommend aspirin daily, statin as well as angiography once medical issues stable.   Right L3/L4  transverse process fracture: Supportive care. No significant trauma history.   Normocytic anemia: No history of obvious blood loss. High suspicion for anemia due to malignancy. 1u pRBC transfused 11/16. Stable.   Thrombocytosis: Likely secondary to acute illness and likely malignancy as above, trend CBC    Tobacco abuse: Counseled, continue transdermal nicotine.  EtOH use: Drinks approximately 40-80 ounces of beer daily. Last drink on 11/15. No signs of withdrawal.  Severe protein calorie malnutrition: Continue supplements   DVT prophylaxis: SCDs Code Status: Full Family Communication: family at bedside Disposition Plan: Pending further treatment and trending fever   Consultants:   Pulmonology  Oncology  IR   Vascular sx   Procedures:   Bronchoscopy 11/17   Antimicrobials:   Unasyn 11/19 - 11/20  Vanco, zosyn 11/20 >>    Subjective: No complaints today, states he is feeling well. Continues to have fevers.   Objective: Vitals:   02/01/16 1346 02/01/16 2211 02/02/16 0124 02/02/16 0533  BP: 122/73 116/61  (!) 107/54  Pulse: 75 97  93  Resp: 15 (!) 24  18  Temp: 99 F (37.2 C) (!) 102.3 F (39.1 C) 98.9 F (37.2 C) 99.3 F (37.4 C)  TempSrc: Oral Oral Oral Oral  SpO2: (!) 82% 95%  96%  Weight:      Height:        Intake/Output Summary (Last 24 hours) at 02/02/16 1229 Last data filed at 02/02/16 0918  Gross per 24 hour  Intake             1590 ml  Output             3155 ml  Net            -  1565 ml   Filed Weights   01/27/16 2155 01/29/16 1005  Weight: 57.3 kg (126 lb 5.2 oz) 57.3 kg (126 lb 5.2 oz)    Examination:  General exam: Appears calm and comfortable, Thin, elderly male Respiratory system: Diminished breath sounds bilaterally, Respiratory effort normal. Cardiovascular system: S1 & S2 heard, RRR. No JVD, murmurs, rubs, gallops or clicks. No pedal edema. +1 Pedal  pulse on right, no pedal pulses appreciated on left,  Gastrointestinal  system: Abdomen is nondistended, soft and nontender. No organomegaly or masses felt. Normal bowel sounds heard. Central nervous system: Alert and oriented. No focal neurological deficits. Extremities: Symmetric 5 x 5 power. Warm to palpation.  Skin: No rashes, lesions or ulcers Psychiatry: Judgement and insight appear normal. Mood & affect appropriate.   Data Reviewed: I have personally reviewed following labs and imaging studies  CBC:  Recent Labs Lab 01/28/16 0608  01/29/16 0651 01/30/16 0535 01/31/16 0627 02/01/16 0427 02/02/16 0830  WBC 16.6*  < > 16.5* 17.0* 16.0* 16.3* 15.3*  NEUTROABS 12.9*  --   --  13.1* 12.1* 11.8* 12.0*  HGB 7.0*  < > 8.2* 7.9* 8.4* 7.9* 7.6*  HCT 23.3*  < > 26.1* 25.3* 26.8* 25.7* 24.8*  MCV 64.9*  < > 66.1* 66.6* 67.0* 67.3* 67.2*  PLT 760*  < > 721* 730* 688* 689* 608*  < > = values in this interval not displayed. Basic Metabolic Panel:  Recent Labs Lab 01/29/16 0651 01/30/16 0535 01/31/16 0627 02/01/16 0427 02/02/16 0830  NA 134* 132* 130* 133* 130*  K 4.5 4.3 4.3 4.3 4.1  CL 101 100* 97* 99* 98*  CO2 '25 23 23 24 22  '$ GLUCOSE 92 93 113* 94 91  BUN '9 10 10 10 10  '$ CREATININE 0.54* 0.48* 0.61 0.55* 0.54*  CALCIUM 8.6* 8.7* 8.7* 8.6* 8.5*   GFR: Estimated Creatinine Clearance: 84.6 mL/min (by C-G formula based on SCr of 0.54 mg/dL (L)). Liver Function Tests:  Recent Labs Lab 01/27/16 1232 01/28/16 0608  AST 20 15  ALT 13* 10*  ALKPHOS 289* 283*  BILITOT 0.2* 0.3  PROT 7.9 7.8  ALBUMIN 1.9* 1.9*   No results for input(s): LIPASE, AMYLASE in the last 168 hours. No results for input(s): AMMONIA in the last 168 hours. Coagulation Profile:  Recent Labs Lab 01/28/16 0608  INR 1.35   Cardiac Enzymes: No results for input(s): CKTOTAL, CKMB, CKMBINDEX, TROPONINI in the last 168 hours. BNP (last 3 results) No results for input(s): PROBNP in the last 8760 hours. HbA1C: No results for input(s): HGBA1C in the last 72  hours. CBG: No results for input(s): GLUCAP in the last 168 hours. Lipid Profile: No results for input(s): CHOL, HDL, LDLCALC, TRIG, CHOLHDL, LDLDIRECT in the last 72 hours. Thyroid Function Tests: No results for input(s): TSH, T4TOTAL, FREET4, T3FREE, THYROIDAB in the last 72 hours. Anemia Panel: No results for input(s): VITAMINB12, FOLATE, FERRITIN, TIBC, IRON, RETICCTPCT in the last 72 hours. Sepsis Labs: No results for input(s): PROCALCITON, LATICACIDVEN in the last 168 hours.  Recent Results (from the past 240 hour(s))  Culture, blood (routine x 2)     Status: None (Preliminary result)   Collection Time: 01/30/16  9:00 AM  Result Value Ref Range Status   Specimen Description BLOOD RIGHT ANTECUBITAL  Final   Special Requests BOTTLES DRAWN AEROBIC AND ANAEROBIC 10CC  Final   Culture NO GROWTH 3 DAYS  Final   Report Status PENDING  Incomplete  Culture, blood (routine x 2)  Status: None (Preliminary result)   Collection Time: 01/30/16  9:15 AM  Result Value Ref Range Status   Specimen Description BLOOD RIGHT ANTECUBITAL  Final   Special Requests BOTTLES DRAWN AEROBIC ONLY [place]  Final   Culture NO GROWTH 3 DAYS  Final   Report Status PENDING  Incomplete       Radiology Studies: No results found.    Scheduled Meds: . feeding supplement (ENSURE ENLIVE)  237 mL Oral BID BM  . folic acid  1 mg Oral Daily  . multivitamin with minerals  1 tablet Oral Daily  . nicotine  14 mg Transdermal QHS  . piperacillin-tazobactam (ZOSYN)  IV  3.375 g Intravenous Q8H  . thiamine  100 mg Oral Daily   Or  . thiamine  100 mg Intravenous Daily  . vancomycin  500 mg Intravenous Q8H   Continuous Infusions: . sodium chloride 10 mL (01/29/16 1015)     LOS: 5 days    Time spent: 30 minutes   Dessa Phi, DO [facility] [email or web address] Password [facility] 02/02/2016, 12:29 PM

## 2016-02-02 NOTE — Progress Notes (Signed)
Pathology obtained 11/17 ( Bronch per Dr. Lamonte Sakai)  pending. Have spoken with patient to let him know we will let him know results as soon as they are resulted.  Wife Lelon Frohlich has requested that if results come in 11/22 to please call her at home with them, as she will not be in 02/03/16. Remains comfortable on Room with Saturations > 96% Medical care per primary team. Will continue to follow pending path results.  Magdalen Spatz, AGACNP-BC [facility] Pulmonary/Critical Care Medicine Pager # [phone] .02/02/2016

## 2016-02-02 NOTE — Progress Notes (Signed)
Patient temp 101 '@21'$ :33. Administered tylenol 650 mg '@21'$ :46.  Rechecked the temp 101.4 '@22'$ :44.  Notify the on Call NP K. Government social research officer .  Will continue to monitor and notify as needed

## 2016-02-02 NOTE — Progress Notes (Signed)
Nutrition Follow-up  DOCUMENTATION CODES:   Severe malnutrition in context of chronic illness  INTERVENTION:   -Continue Ensure Enlive po BID, each supplement provides 350 kcal and 20 grams of protein -Downgrade diet to dysphagia 3 consistency for ease of intake -Educated pt and wife on high calorie, high protein diet. Provided "High Calorie, High Protein Nutrition Therapy" handout from [facility]'s Nutrition Care Manual  NUTRITION DIAGNOSIS:   Malnutrition related to chronic illness as evidenced by severe depletion of muscle mass, moderate depletion of body fat, percent weight loss.  Ongoing  GOAL:   Patient will meet greater than or equal to 90% of their needs  Progressing  MONITOR:   PO intake, Supplement acceptance, Labs, Weight trends  REASON FOR ASSESSMENT:   Malnutrition Screening Tool    ASSESSMENT:   55 y.o. male with tobacco abuse presents to the ER because of worsening left lower extremity pain over the last few days. Denies any fall or trauma. Has been having some productive cough and weight loss of 30 pounds last few months. 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 Labs also revealed microcytic hypochromic anemia which is new.  Spoke with pt and pt wife at bedside. Pt wife expressed concern over pt's weight loss PTA. Both pt and wife reports that appetite has been variable; some days he will eat well and other days he will have minimal appetite. Documeted meal intake 50-100% Noted pt consumed 100% of breakfast tray other than fresh fruit cup. Per pt, some meats and fruits and vegetables are difficult to chew related to lack of teeth. He is amenable to diet downgrade for ease of intake.   Pt underwent bronchoscopy on 01/29/16; biopsies are pending.   Discussed high calorie, high protein diet to assist with prevention of further  weight loss. Also discussed inclusion of 6 small meals per day to optimize intake. Pt is consuming Ensure supplements and is amenable to continuing them during hospitalization and at home. Also shared with pt and wife other alternatives to Ensure Enlive that were available in the retail setting. Teachback method used and RD contact information provided. Expect fair to good compliance.  Labs reviewed: Na: 130 (on IV supplementation).   Diet Order:  Diet Heart Room service appropriate? Yes; Fluid consistency: Thin  Skin:  Reviewed, no issues  Last BM:  01/31/16  Height:   Ht Readings from Last 1 Encounters:  01/29/16 '5\' 6"'$  (1.676 m)    Weight:   Wt Readings from Last 1 Encounters:  01/29/16 126 lb 5.2 oz (57.3 kg)    Ideal Body Weight:  64.5 kg  BMI:  Body mass index is 20.39 kg/m.  Estimated Nutritional Needs:   Kcal:  1700-1900 (30-33 kcal/kg)  Protein:  80-95 grams (1.4-1.6 g/kg)  Fluid:  1.7-1.9 L/day  EDUCATION NEEDS:   No education needs identified at this time  Bernetta Sutley A. Jimmye Norman, RD, LDN, CDE Pager: [phone] After hours Pager: [phone]

## 2016-02-02 NOTE — Progress Notes (Signed)
Oncology short Note  Patient on treatment for post-obstructive pneumonia. 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 [facility] sent out. MRI brain neg for mets Plan -mx of post-obstructive pneumonia per hospitalist team. -will need outpatient PET/CT to complete staging -sent message to scheduler to help setup out patient follow with medical oncology/Dr Irene Limbo in the next 7-10 days.  Sullivan Lone MD MS

## 2016-02-03 ENCOUNTER — Telehealth: Payer: Self-pay | Admitting: Hematology

## 2016-02-03 ENCOUNTER — Telehealth: Payer: Self-pay | Admitting: Emergency Medicine

## 2016-02-03 ENCOUNTER — Inpatient Hospital Stay (HOSPITAL_COMMUNITY): Payer: Medicaid Other

## 2016-02-03 DIAGNOSIS — J189 Pneumonia, unspecified organism: Secondary | ICD-10-CM

## 2016-02-03 DIAGNOSIS — I739 Peripheral vascular disease, unspecified: Secondary | ICD-10-CM

## 2016-02-03 DIAGNOSIS — E43 Unspecified severe protein-calorie malnutrition: Secondary | ICD-10-CM

## 2016-02-03 LAB — BASIC METABOLIC PANEL
Anion gap: 8 (ref 5–15)
BUN: 9 mg/dL (ref 6–20)
CALCIUM: 8.6 mg/dL — AB (ref 8.9–10.3)
CO2: 24 mmol/L (ref 22–32)
Chloride: 102 mmol/L (ref 101–111)
Creatinine, Ser: 0.6 mg/dL — ABNORMAL LOW (ref 0.61–1.24)
GFR calc non Af Amer: >60 mL/min (ref >60)
Glucose, Bld: 108 mg/dL — ABNORMAL HIGH (ref 65–99)
Potassium: 3.7 mmol/L (ref 3.5–5.1)
Sodium: 134 mmol/L — ABNORMAL LOW (ref 135–145)

## 2016-02-03 LAB — CBC WITH DIFFERENTIAL/PLATELET
BASOS ABS: 0 10*3/uL (ref 0.0–0.1)
Basophils Relative: 0 %
EOS PCT: 1 %
Eosinophils Absolute: 0.2 10*3/uL (ref 0.0–0.7)
HEMATOCRIT: 26.6 % — AB (ref 39.0–52.0)
HEMOGLOBIN: 8.3 g/dL — AB (ref 13.0–17.0)
LYMPHS ABS: 1.8 10*3/uL (ref 0.7–4.0)
LYMPHS PCT: 10 %
MCH: 21 pg — ABNORMAL LOW (ref 26.0–34.0)
MCHC: 31.2 g/dL (ref 30.0–36.0)
MCV: 67.3 fL — ABNORMAL LOW (ref 78.0–100.0)
MONOS PCT: 9 %
Monocytes Absolute: 1.6 10*3/uL — ABNORMAL HIGH (ref 0.1–1.0)
NEUTROS PCT: 80 %
Neutro Abs: 14 10*3/uL — ABNORMAL HIGH (ref 1.7–7.7)
Platelets: 703 10*3/uL — ABNORMAL HIGH (ref 150–400)
RBC: 3.95 MIL/uL — AB (ref 4.22–5.81)
RDW: 19.1 % — AB (ref 11.5–15.5)
WBC: 17.6 10*3/uL — AB (ref 4.0–10.5)

## 2016-02-03 LAB — VANCOMYCIN, TROUGH: Vancomycin Tr: 9 ug/mL — ABNORMAL LOW (ref 15–20)

## 2016-02-03 MED ORDER — SODIUM CHLORIDE 0.9 % IV BOLUS (SEPSIS)
500.0000 mL | Freq: Once | INTRAVENOUS | Status: AC
  Administered 2016-02-03: 500 mL via INTRAVENOUS

## 2016-02-03 MED ORDER — ENOXAPARIN SODIUM 40 MG/0.4ML ~~LOC~~ SOLN
40.0000 mg | SUBCUTANEOUS | Status: DC
  Administered 2016-02-03 – 2016-02-05 (×3): 40 mg via SUBCUTANEOUS
  Filled 2016-02-03 (×3): qty 0.4

## 2016-02-03 MED ORDER — VANCOMYCIN HCL IN DEXTROSE 1-5 GM/200ML-% IV SOLN
1000.0000 mg | INTRAVENOUS | Status: AC
  Administered 2016-02-03: 1000 mg via INTRAVENOUS
  Filled 2016-02-03: qty 200

## 2016-02-03 MED ORDER — VANCOMYCIN HCL IN DEXTROSE 1-5 GM/200ML-% IV SOLN
1000.0000 mg | Freq: Three times a day (TID) | INTRAVENOUS | Status: DC
  Filled 2016-02-03: qty 200

## 2016-02-03 NOTE — Progress Notes (Signed)
Pharmacy Antibiotic Note  [name] Chapman is a 55 y.o. male admitted on 01/27/2016 with claudication pain in left lower extremity and right lower lung mass. Pharmacy has been consulted for Vancomycin and Zosyn dosing for postobstructive pneumonia.  Fever last night (101.4 F), renal function stable, UOP good, WBC up to 17.6. Vancomycin trough is subtherapeutic at 9.   Plan: Increase vancomycin to 1000 mg IV q8h (Goal 15-20 mcg/mL) Continue Zosyn 3.375 g IV q8h (4-hour infusion) Monitor renal function, C/S, and clinical progress Vancomycin trough at steady state  Height: '5\' 6"'$  (167.6 cm) Weight: 126 lb 5.2 oz (57.3 kg) IBW/kg (Calculated) : 63.8  Temp (24hrs), Avg:99.7 F (37.6 C), Min:98.1 F (36.7 C), Max:101.4 F (38.6 C)   Recent Labs Lab 01/30/16 0535 01/31/16 0627 02/01/16 0427 02/02/16 0830 02/03/16 0914  WBC 17.0* 16.0* 16.3* 15.3* 17.6*  CREATININE 0.48* 0.61 0.55* 0.54* 0.60*  VANCOTROUGH  --   --   --   --  9*    Estimated Creatinine Clearance: 84.6 mL/min (by C-G formula based on SCr of 0.6 mg/dL (L)).    No Known Allergies  Antimicrobials this admission: Unasyn 11/19 >> 11/20 Vanco 11/20>> Zosyn 11/20>>  Adjustments: 11/22 VT 9 on 500 mg q8h - chg to 1g q8h  Microbiology results: 11/18 BCx: ngtd   Thank you for allowing pharmacy to be a part of this patient's care.  Renold Genta, PharmD, BCPS Clinical Pharmacist Phone for today - [place] - [phone] 02/03/2016 10:49 AM

## 2016-02-03 NOTE — Progress Notes (Signed)
Name: [name] Chapman MRN: [id number] DOB: 19-Jul-1960    ADMISSION DATE:  01/27/2016 CONSULTATION DATE:  01/27/16  REFERRING MD :  Hal Hope  CHIEF COMPLAINT:  L leg pain   HISTORY OF PRESENT ILLNESS:  [name] Chapman is a 55 y.o. male active smoker with multiple medical problems.  Admitted 11/15 with LLE pain, weight loss.  W/u revealed incidental finding of large (9 x 13.2 x 13.9 cm) RUL mass with mediastinal lymphadenopathy and small R pleural effusion +/- post obstructive PNA.  He underwent FOB bx 11/17.  He has been treated with IV abx for post obstructive PNA.     SUBJECTIVE:   Still with intermittent fever.  Feels like breathing is better.   VITAL SIGNS: Temp:  [98.1 F (36.7 C)-101.4 F (38.6 C)] 98.1 F (36.7 C) (11/22 0523) Pulse Rate:  [97] 97 (11/22 0523) Resp:  [18] 18 (11/22 0523) BP: (97-112)/(64-65) 97/64 (11/22 0523) SpO2:  [93 %-100 %] 93 % (11/22 0523)  PHYSICAL EXAMINATION: General: pleasant thin male resting in bed, in NAD. Neuro: A&O x 3, non-focal.  HEENT: [place]/AT. PERRL, sclerae anicteric. Cardiovascular: RRR, no M/R/G.  Lungs: Respirations even unlabored.  Clear bilaterally, slightly diminished in right base. Abdomen: BS x 4, soft, NT/ND.  Musculoskeletal: No gross deformities, no edema.  Skin: Intact, warm, no rashes.     Recent Labs Lab 02/01/16 0427 02/02/16 0830 02/03/16 0914  NA 133* 130* 134*  K 4.3 4.1 3.7  CL 99* 98* 102  CO2 '24 22 24  '$ BUN '10 10 9  '$ CREATININE 0.55* 0.54* 0.60*  GLUCOSE 94 91 108*    Recent Labs Lab 02/01/16 0427 02/02/16 0830 02/03/16 0914  HGB 7.9* 7.6* 8.3*  HCT 25.7* 24.8* 26.6*  WBC 16.3* 15.3* 17.6*  PLT 689* 608* 703*   No results found.  STUDIES:  CTA chest 11/15 > large 9 x 13.2 x 13.9 cm right lower lobe lung mass was contiguous with the hilum compatible with neoplasm, primary lung versus lymphoma.  CTA abdomen 11/15 > complete occlusion of left common iliac artery, reconstitution distal left  internal/external iliac arteries. ABI 11/15 > normal findings on the right however severe arterial disease on the left.  SIGNIFICANT EVENTS  11/15 >admitted with left leg pain due to severe peripheral arterial disease. Additionally, had incidental finding of a large right lung mass. 11/17 FOB bx >>> adenocarcinoma   ASSESSMENT / PLAN:  Large right lower lobe lung mass - path shows Adenocarcinoma.  Incidental finding in a patient with roughly 30-pack-year smoking history along with his history of unintentional weight loss.  Mild emphysema. Plan:  Discussed path results at length with pt 11/22.   Oncology is following and will schedule outpt f/u appt.   Will need PET on d/c.  Attempted to call wife several times without answer.   Continue IV abx per primary for post obstructive PNA  DuoNebs / Albuterol PRN.   Nickolas Madrid, NP 02/03/2016  11:48 AM Pager: [phone] or [phone]   STAFF NOTE: I, Merrie Roof, MD FACP have personally reviewed patient's available data, including medical history, events of note, physical examination and test results as part of my evaluation. I have discussed with resident/NP and other care providers such as pharmacist, RN and RRT. In addition, I personally evaluated patient and elicited key findings of: in bed sleeping, no distress, mild temp noted, reduced BS rt base, adeno noted on path and d/w pt, no distress, this mass is so large that fevers  may be inherent to the mass, would repeat pcxr today for worsening post obstruction process?, needs pet scan, oncology med docs to evaluate pt, maintain zosyn, will sign off, call if needed in future, I will evaluate the pcxr I ordered and make any appropriate changes needed   Lavon Paganini. Titus Mould, MD, [place] Pgr: [place] [facility] 02/03/2016 12:40 PM

## 2016-02-03 NOTE — Telephone Encounter (Signed)
contact pt regarding hospital f/u but unable to leave vm.

## 2016-02-03 NOTE — Telephone Encounter (Signed)
I spoke with Deneise Lever, pt's wife by phone. I was unaware that the patient was still hospitalized. 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 He will need staging workup, discussed this and oncology eval with her as well. She is planning to go to hospital to see him today. This info was related to the patient earlier today by our in-house team.

## 2016-02-03 NOTE — Progress Notes (Signed)
PROGRESS NOTE        PATIENT DETAILS Name: [name] Chapman Age: 55 y.o. Sex: male Date of Birth: Nov 17, 1960 Admit Date: 01/27/2016 Admitting Physician Rise Patience, MD PCP:No PCP Per Patient  Brief Narrative: Patient is a 55 y.o. malewith long-standing history of tobacco use, presented to the hospital for evaluation of claudication pain involving his left lower extremity. This was evaluated by a CT angiogram of the chest, abdomen/pelvis which demonstrated a large right lung mass. 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 Hospital course complicated by persistent fever felt to be secondary to postobstructive pneumonia. See below for further details   Subjective: No major complaints-febrile last night again.  Assessment/Plan: Right lower lobe lung mass: Long-standing history of tobacco use. 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 MRI brain completed which was negative. Will need outpatient PET/CT. Oncology follow up with Dr. Irene Limbo in 1 week.   Fever: Continues to febrile-probably either secondary to postobstructive pneumonia or tumor fever (per IR note-majority of the lung mass appeared necrotic on CT).Repeat blood culture today-initial blood culture on 11/18 negative. Bilateral lower extremity Doppler negative. Continue with broad-spectrum antibiotics for now, if repeat cultures are negative-and no other foci evident over the next few days-then likely tumor fever-and may need a trial of NSAID's  Peripheralvascular disease with left leg claudication pain:Patient has had claudication involving his left lower extremity for approximately 1 year, he can now only walk approximately 100 feet before he starts having pain. 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 Vascular surgery consulted-likely chronic occulusion, recommendations are to percutaneous revascularization once medical issues stable.   Right L3/L4 transverse process fracture: Supportive care. No significant trauma history.   Normocytic anemia:No history of obvious blood loss. High suspicion for anemia due to malignancy. 1u pRBC transfused 11/16. Stable.   Thrombocytosis: Likely secondary to acute illness and likely malignancy as above, trend CBC    Tobacco abuse: Counseled, continue transdermal nicotine.  EtOH use: Drinks approximately 40-80 ounces of beer daily. Last drink on 11/15. No signs of withdrawal.  Severe protein calorie malnutrition: Continue supplements  DVT Prophylaxis: Start Prophylactic Lovenox   Code Status: Full code   Family Communication: None at bedside  Disposition Plan: Remain inpatient-home in 1-2 days  Antimicrobial agents: See below  Procedures:  Bronchoscopy 11/  CONSULTS:  Pulmonology  Oncology  IR   Vascular sx   Time spent: 25 minutes-Greater than 50% of this time was spent in counseling, explanation of diagnosis, planning of further management, and coordination of care.  MEDICATIONS: Anti-infectives    Start     Dose/Rate Route Frequency Ordered Stop   02/03/16 2000  vancomycin (VANCOCIN) IVPB 1000 mg/200 mL premix  Status:  Discontinued     1,000 mg 200 mL/hr over 60 Minutes Intravenous Every 8 hours 02/03/16 1048 02/03/16 1243   02/03/16 1100  vancomycin (VANCOCIN) IVPB 1000 mg/200 mL premix     1,000 mg 200 mL/hr over 60 Minutes Intravenous NOW 02/03/16 1048 02/03/16 1327   02/01/16 1800  vancomycin (VANCOCIN) 500 mg in sodium chloride 0.9 % 100 mL IVPB  Status:  Discontinued  500 mg 100 mL/hr over 60 Minutes Intravenous Every 8 hours 02/01/16 0901 02/03/16 1048   02/01/16 1000  piperacillin-tazobactam (ZOSYN)  IVPB 3.375 g     3.375 g 12.5 mL/hr over 240 Minutes Intravenous Every 8 hours 02/01/16 0901     02/01/16 0900  vancomycin (VANCOCIN) IVPB 1000 mg/200 mL premix     1,000 mg 200 mL/hr over 60 Minutes Intravenous  Once 02/01/16 0901 02/01/16 1101   01/31/16 0900  ampicillin-sulbactam (UNASYN) 1.5 g in sodium chloride 0.9 % 50 mL IVPB  Status:  Discontinued     1.5 g 100 mL/hr over 30 Minutes Intravenous Every 6 hours 01/31/16 0756 02/01/16 0758      Scheduled Meds: . feeding supplement (ENSURE ENLIVE)  237 mL Oral BID BM  . folic acid  1 mg Oral Daily  . multivitamin with minerals  1 tablet Oral Daily  . nicotine  14 mg Transdermal QHS  . piperacillin-tazobactam (ZOSYN)  IV  3.375 g Intravenous Q8H  . thiamine  100 mg Oral Daily   Or  . thiamine  100 mg Intravenous Daily   Continuous Infusions: . sodium chloride 10 mL (01/29/16 1015)   PRN Meds:.acetaminophen **OR** acetaminophen, albuterol, ipratropium-albuterol, ondansetron **OR** ondansetron (ZOFRAN) IV   PHYSICAL EXAM: Vital signs: Vitals:   02/02/16 2244 02/03/16 0103 02/03/16 0523 02/03/16 1345  BP:   97/64 107/67  Pulse:   97 77  Resp:   18   Temp: (!) 101.4 F (38.6 C) 98.2 F (36.8 C) 98.1 F (36.7 C) 98.2 F (36.8 C)  TempSrc: Oral Oral Oral Oral  SpO2:   93% 99%  Weight:      Height:       Filed Weights   01/27/16 2155 01/29/16 1005  Weight: 57.3 kg (126 lb 5.2 oz) 57.3 kg (126 lb 5.2 oz)   Body mass index is 20.39 kg/m.   General appearance :Awake, alert, not in any distress.Chronically sick appearing Eyes:, pupils equally reactive to light and accomodation,no scleral icterus.Pink conjunctiva HEENT: Atraumatic and Normocephalic Neck: supple, no JVD. No cervical lymphadenopathy. No thyromegaly Resp:Good air entry bilaterally, no added sounds  CVS: S1 S2 regular, no murmurs.  GI: Bowel sounds present, Non tender and not distended with no gaurding, rigidity or rebound.No organomegaly Extremities: B/L  Lower Ext shows no edema, both legs are warm to touch Neurology:  speech clear,Non focal, sensation is grossly intact. Psychiatric: Normal judgment and insight. Alert and oriented x 3. Normal mood. Musculoskeletal:No digital cyanosis Skin:No Rash, warm and dry Wounds:N/A  I have personally reviewed following labs and imaging studies  LABORATORY DATA: CBC:  Recent Labs Lab 01/30/16 0535 01/31/16 0627 02/01/16 0427 02/02/16 0830 02/03/16 0914  WBC 17.0* 16.0* 16.3* 15.3* 17.6*  NEUTROABS 13.1* 12.1* 11.8* 12.0* 14.0*  HGB 7.9* 8.4* 7.9* 7.6* 8.3*  HCT 25.3* 26.8* 25.7* 24.8* 26.6*  MCV 66.6* 67.0* 67.3* 67.2* 67.3*  PLT 730* 688* 689* 608* 703*    Basic Metabolic Panel:  Recent Labs Lab 01/30/16 0535 01/31/16 0627 02/01/16 0427 02/02/16 0830 02/03/16 0914  NA 132* 130* 133* 130* 134*  K 4.3 4.3 4.3 4.1 3.7  CL 100* 97* 99* 98* 102  CO2 '23 23 24 22 24  '$ GLUCOSE 93 113* 94 91 108*  BUN '10 10 10 10 9  '$ CREATININE 0.48* 0.61 0.55* 0.54* 0.60*  CALCIUM 8.7* 8.7* 8.6* 8.5* 8.6*    GFR: Estimated Creatinine Clearance: 84.6 mL/min (by C-G formula based on SCr of 0.6 mg/dL (L)).  Liver Function Tests:  Recent Labs Lab 01/28/16 0608  AST 15  ALT 10*  ALKPHOS 283*  BILITOT 0.3  PROT 7.8  ALBUMIN 1.9*   No results for input(s): LIPASE, AMYLASE in the last 168 hours. No results for input(s): AMMONIA in the last 168 hours.  Coagulation Profile:  Recent Labs Lab 01/28/16 0608  INR 1.35    Cardiac Enzymes: No results for input(s): CKTOTAL, CKMB, CKMBINDEX, TROPONINI in the last 168 hours.  BNP (last 3 results) No results for input(s): PROBNP in the last 8760 hours.  HbA1C: No results for input(s): HGBA1C in the last 72 hours.  CBG: No results for input(s): GLUCAP in the last 168 hours.  Lipid Profile: No results for input(s): CHOL, HDL, LDLCALC, TRIG, CHOLHDL, LDLDIRECT in the last 72 hours.  Thyroid Function Tests: No results for input(s): TSH,  T4TOTAL, FREET4, T3FREE, THYROIDAB in the last 72 hours.  Anemia Panel: No results for input(s): VITAMINB12, FOLATE, FERRITIN, TIBC, IRON, RETICCTPCT in the last 72 hours.  Urine analysis:    Component Value Date/Time   COLORURINE YELLOW 01/27/2016 [place] 01/27/2016 1246   LABSPEC 1.022 01/27/2016 1246   PHURINE 6.0 01/27/2016 1246   GLUCOSEU NEGATIVE 01/27/2016 1246   HGBUR NEGATIVE 01/27/2016 [place] 01/27/2016 1246   KETONESUR NEGATIVE 01/27/2016 [place] 01/27/2016 1246   NITRITE NEGATIVE 01/27/2016 1246   LEUKOCYTESUR NEGATIVE 01/27/2016 1246    Sepsis Labs: Lactic Acid, Venous No results found for: LATICACIDVEN  MICROBIOLOGY: Recent Results (from the past 240 hour(s))  Culture, blood (routine x 2)     Status: None (Preliminary result)   Collection Time: 01/30/16  9:00 AM  Result Value Ref Range Status   Specimen Description BLOOD RIGHT ANTECUBITAL  Final   Special Requests BOTTLES DRAWN AEROBIC AND ANAEROBIC 10CC  Final   Culture NO GROWTH 3 DAYS  Final   Report Status PENDING  Incomplete  Culture, blood (routine x 2)     Status: None (Preliminary result)   Collection Time: 01/30/16  9:15 AM  Result Value Ref Range Status   Specimen Description BLOOD RIGHT ANTECUBITAL  Final   Special Requests BOTTLES DRAWN AEROBIC ONLY [place]  Final   Culture NO GROWTH 3 DAYS  Final   Report Status PENDING  Incomplete    RADIOLOGY STUDIES/RESULTS: Dg Chest 2 View  Result Date: 01/27/2016 CLINICAL DATA:  Intermittent left lower extremity pain after the last year. 30 pound weight loss over the last 2 months. EXAM: CHEST  2 VIEW COMPARISON:  Chest CT 06/16/2007. FINDINGS: Heart size is normal. Mediastinal shadows are normal. Left lung is clear. There is consolidation and collapse in the right lower lobe. Right upper lobe is clear. Some volume loss in the right middle lobe. There is some pleural density on the right laterally.  IMPRESSION: Consolidation and volume loss in the right lower lobe. Mild volume loss in the right middle lobe. Pleural density on the right laterally. Findings could be due to chronic pneumonia with volume loss, but the possibility of malignant disease does exist. Chest CT with contrast suggested when able. Electronically Signed   By: Nelson Chimes M.D.   On: 01/27/2016 12:59   Mr Jeri Cos GY Contrast  Result Date: 01/30/2016 CLINICAL DATA:  Newly diagnosed lung cancer. Evaluation for intracranial metastatic disease. EXAM: MRI HEAD WITHOUT AND WITH CONTRAST TECHNIQUE: Multiplanar, multiecho pulse sequences of the brain and surrounding structures were obtained without and with intravenous  contrast. CONTRAST:  48m MULTIHANCE GADOBENATE DIMEGLUMINE 529 MG/ML IV SOLN COMPARISON:  Head CT 06/16/2007 FINDINGS: Brain: No acute infarct or intraparenchymal hemorrhage. The midline structures are normal. No focal parenchymal signal abnormality. No mass lesion or midline shift. No hydrocephalus or extra-axial fluid collection. No contrast-enhancing lesions. Vascular: Major intracranial arterial and venous sinus flow voids are preserved. No evidence of chronic microhemorrhage or amyloid angiopathy. Skull and upper cervical spine: The visualized skull base, calvarium, upper cervical spine and extracranial soft tissues are normal. Sinuses/Orbits: Complete opacification of the left maxillary sinus. Small amount of bilateral mastoid fluid. Normal orbits. IMPRESSION: No intracranial metastatic disease. Electronically Signed   By: KUlyses JarredM.D.   On: 01/30/2016 00:40   Dg Chest Port 1 View  Result Date: 02/03/2016 CLINICAL DATA:  Chest mass. EXAM: PORTABLE CHEST 1 VIEW COMPARISON:  Radiograph of January 29, 2016. CT scan of January 27, 2016. FINDINGS: The heart size and mediastinal contours are within normal limits. Left lung is clear. No pneumothorax is noted. Stable appearance of large right lower lobe lung mass is  noted with associated minimal pleural effusion. The visualized skeletal structures are unremarkable. IMPRESSION: Stable appearance of large right lung mass is noted as described on prior exams. Electronically Signed   By: JMarijo Conception M.D.   On: 02/03/2016 13:05   Dg Chest Port 1 View  Result Date: 01/29/2016 CLINICAL DATA:  Status post bronchoscopic biopsy. EXAM: PORTABLE CHEST 1 VIEW COMPARISON:  Chest x-ray and chest CT 01/27/2016 FINDINGS: Large right lower lobe lung mass is again demonstrated with right hilar adenopathy. No postprocedural pneumothorax is identified. The left lung remains clear. IMPRESSION: Stable large right lower lobe lung mass. No postprocedural pneumothorax. Electronically Signed   By: PMarijo SanesM.D.   On: 01/29/2016 12:26   Ct Angio Chest/abd/pel For Dissection W And/or W/wo  Result Date: 01/27/2016 CLINICAL DATA:  Intermittent LEFT lower extremity pain for 1 year and intermittent foot numbness. 30 pound weight loss in 2 months. Follow-up possible chest malignancy. EXAM: CT ANGIOGRAPHY CHEST, ABDOMEN AND PELVIS TECHNIQUE: Multidetector CT imaging through the chest, abdomen and pelvis was performed using the standard protocol during bolus administration of intravenous contrast. Multiplanar reconstructed images and MIPs were obtained and reviewed to evaluate the vascular anatomy. CONTRAST:  80 cc Isovue 370 COMPARISON:  Chest radiograph January 27, 2016 at 1238 hours FINDINGS: CTA CHEST FINDINGS CARDIOVASCULAR: Thoracic aorta is normal course and caliber. No intrinsic density on noncontrast CT. Homogeneous contrast opacification of thoracic aorta without dissection, aneurysm, luminal irregularity, periaortic fluid collections, or contrast extravasation. Trace coronary artery calcifications. Heart size is normal. No pericardial effusion. MEDIASTINUM/NODES: 2.5 mm sub carinal nodal conglomeration. 12 mm RIGHT anterior mediastinal/ prevascular necrotic lymph node. RIGHT hilar  lymphadenopathy contiguous with RIGHT lung mass. LUNGS/PLEURA: 9.1 x 13.2 x 13.9 cm RIGHT lung base mass contiguous with the RIGHT hilum, and is contiguous with the diaphragm and pleura. Effaced RIGHT lower lobe bronchi. RIGHT hilar lymphadenopathy narrows the RIGHT middle lobe bronchi. Mass encases the RIGHT lower lobe pulmonary arteries which are patent. No chest wall invasion. Patchy ground-glass nodules and opacities RIGHT lower lobe. LEFT lung base dependent atelectasis. Mild centrilobular emphysema. 3 mm RIGHT middle lobe pulmonary nodule versus granuloma. MUSCULOSKELETAL: Nonsuspicious. Sub cm LEFT thyroid nodule below size followup recommendations. Review of the MIP images confirms the above findings. CTA ABDOMEN AND PELVIS FINDINGS ARTERIES: Abdominal aorta is normal course and caliber. Mild calcific atherosclerosis. Complete occlusion LEFT Common iliac artery at the  origin with peripheral calcifications. Reconstitution of distal LEFT external iliac artery. Reconstitution of distal LEFT internal iliac arteries. Homogeneous contrast opacification of the aorta vessels without dissection, aneurysm, luminal irregularity, periaortic fluid collections, or contrast extravasation. Celiac axis, superior and inferior mesenteric arteries are patent. Moderate stenosis inferior mesenteric artery origin. HEPATOBILIARY: Liver and gallbladder are normal. PANCREAS: Normal. SPLEEN: Normal. ADRENALS/URINARY TRACT: Kidneys are orthotopic, demonstrating symmetric enhancement. No nephrolithiasis, hydronephrosis or solid renal masses. 4.8 cm RIGHT interpolar benign-appearing cyst. The unopacified ureters are normal in course and caliber. Urinary bladder is well distended and unremarkable. Normal adrenal glands. STOMACH/BOWEL: The stomach, small and large bowel are normal in course and caliber without inflammatory changes decreased sensitivity without enteric contrast. Normal appendix. VASCULAR/LYMPHATIC: No lymphadenopathy by CT  size criteria. REPRODUCTIVE: Normal. OTHER: No intraperitoneal free fluid or free air. MUSCULOSKELETAL: Nonacute. Bridging sacroiliac osteophytes. Nondisplaced RIGHT L3, L4 healing transverse process fractures. Review of the MIP images confirms the above findings. IMPRESSION: CTA CHEST: 9 x 1 x 13.2 x 13.9 cm RIGHT lung mass contiguous with the hilum compatible with neoplasm, primary lung versus lymphoma. Mediastinal lymphadenopathy. RIGHT lower lobe probable postobstructive pneumonia. Small RIGHT pleural effusion. Moderate emphysema. No acute vascular process. 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 Reconstitution distal LEFT internal external iliac arteries. Mild atherosclerosis. Acute to subacute nondisplaced RIGHT L3 and L4 transverse process fractures appear traumatic, not pathologic. Acute findings discussed with and reconfirmed by Dr.WARREN JONES on 01/27/2016 at 5:45 pm. Electronically Signed   By: Elon Alas M.D.   On: 01/27/2016 17:50     LOS: 6 days   Oren Binet, MD  [facility] Pager:[phone]  If 7PM-7AM, please contact night-coverage [email or web address] Password [email or web address] 02/03/2016, 2:50 PM

## 2016-02-04 ENCOUNTER — Inpatient Hospital Stay (HOSPITAL_COMMUNITY): Payer: Medicaid Other

## 2016-02-04 DIAGNOSIS — Z8249 Family history of ischemic heart disease and other diseases of the circulatory system: Secondary | ICD-10-CM

## 2016-02-04 DIAGNOSIS — L989 Disorder of the skin and subcutaneous tissue, unspecified: Secondary | ICD-10-CM

## 2016-02-04 DIAGNOSIS — C3491 Malignant neoplasm of unspecified part of right bronchus or lung: Principal | ICD-10-CM

## 2016-02-04 DIAGNOSIS — Z9889 Other specified postprocedural states: Secondary | ICD-10-CM

## 2016-02-04 DIAGNOSIS — F1010 Alcohol abuse, uncomplicated: Secondary | ICD-10-CM

## 2016-02-04 DIAGNOSIS — R509 Fever, unspecified: Secondary | ICD-10-CM

## 2016-02-04 DIAGNOSIS — C3411 Malignant neoplasm of upper lobe, right bronchus or lung: Secondary | ICD-10-CM

## 2016-02-04 DIAGNOSIS — F17210 Nicotine dependence, cigarettes, uncomplicated: Secondary | ICD-10-CM

## 2016-02-04 LAB — CBC
HEMATOCRIT: 25 % — AB (ref 39.0–52.0)
Hemoglobin: 7.6 g/dL — ABNORMAL LOW (ref 13.0–17.0)
MCH: 20.4 pg — AB (ref 26.0–34.0)
MCHC: 30.4 g/dL (ref 30.0–36.0)
MCV: 67.2 fL — AB (ref 78.0–100.0)
PLATELETS: 651 10*3/uL — AB (ref 150–400)
RBC: 3.72 MIL/uL — AB (ref 4.22–5.81)
RDW: 19.3 % — ABNORMAL HIGH (ref 11.5–15.5)
WBC: 18.5 10*3/uL — ABNORMAL HIGH (ref 4.0–10.5)

## 2016-02-04 LAB — CULTURE, BLOOD (ROUTINE X 2)
CULTURE: NO GROWTH
Culture: NO GROWTH

## 2016-02-04 MED ORDER — PIPERACILLIN-TAZOBACTAM 3.375 G IVPB
3.3750 g | Freq: Three times a day (TID) | INTRAVENOUS | Status: DC
  Administered 2016-02-04 – 2016-02-06 (×6): 3.375 g via INTRAVENOUS
  Filled 2016-02-04 (×9): qty 50

## 2016-02-04 NOTE — Progress Notes (Signed)
Pt noted temp--101.6, PRN Tylenol given '@2315'$ . Notified oncall K.Schorr,NP received NS bolus 535m x1

## 2016-02-04 NOTE — Consult Note (Signed)
Date of Admission:  01/27/2016  Date of Consult:  02/04/2016  Reason for Consult: Persistent fevers in a patient with newly diagnosed non-small lung cancer and postobstructive pneumonia Referring Physician: Dr. Sloan Leiter   HPI: [name] Chapman is an 55 y.o. male with long-standing history of tobacco use, presented to the hospital for evaluation of claudication pain involving his left lower extremity. This was evaluated by a CT angiogram of the chest, abdomen/pelvis which demonstrated a large right lung mass. 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 Hospital course complicated by persistent fever felt to be secondary to postobstructive pneumonia. He is now 8 days into his admission and at least 5 days into treatment with Unasyn then Zosyn and vancomycin but with persistent fevers up to 101. His cough has improved and he overall feels much better than before. There is no clear-cut other cause of his fevers today as mentioned he underwent CT angiogram of the chest abdomen and pelvis which failed to show any other pathology other than the lung cancer.     History reviewed. No pertinent past medical history.  Past Surgical History:  Procedure Laterality Date  . VIDEO BRONCHOSCOPY Bilateral 01/29/2016   Procedure: VIDEO BRONCHOSCOPY WITH FLUORO;  Surgeon: Collene Gobble, MD;  Location: [facility];  Service: Cardiopulmonary;  Laterality: Bilateral;    Social History:  reports that he has been smoking.  He has been smoking about 1.00 pack per day. He has never used smokeless tobacco. He reports that he drinks about 3.6 oz of alcohol per week . He reports that he does not use drugs.   Family History  Problem Relation Age of Onset  . CAD Father     No Known Allergies   Medications: I have reviewed patients current medications as documented in Epic Anti-infectives    Start     Dose/Rate Route Frequency Ordered Stop   02/03/16 2000  vancomycin (VANCOCIN) IVPB 1000 mg/200 mL premix  Status:  Discontinued     1,000 mg 200 mL/hr over 60 Minutes Intravenous Every 8 hours 02/03/16 1048 02/03/16 1243   02/03/16 1100  vancomycin (VANCOCIN) IVPB 1000 mg/200 mL premix     1,000 mg 200 mL/hr over 60 Minutes Intravenous NOW 02/03/16 1048 02/03/16 1327   02/01/16 1800  vancomycin (VANCOCIN) 500 mg in sodium chloride 0.9 % 100 mL IVPB  Status:  Discontinued     500 mg 100 mL/hr over 60 Minutes Intravenous Every 8 hours 02/01/16 0901 02/03/16 1048   02/01/16 1000  piperacillin-tazobactam (ZOSYN) IVPB 3.375 g  Status:  Discontinued     3.375 g 12.5 mL/hr over 240 Minutes Intravenous Every 8 hours 02/01/16 0901 02/04/16 1314   02/01/16 0900  vancomycin (VANCOCIN) IVPB 1000 mg/200 mL premix     1,000 mg 200 mL/hr over 60 Minutes Intravenous  Once 02/01/16 0901 02/01/16 1101   01/31/16 0900  ampicillin-sulbactam (UNASYN) 1.5 g in sodium chloride 0.9 % 50 mL IVPB  Status:  Discontinued     1.5 g 100 mL/hr over 30 Minutes Intravenous Every 6 hours 01/31/16 0756 02/01/16 0758         ROS:  as in HPI otherwise remainder of 12 point Review of Systems is negative  Blood pressure 116/73, pulse 90, temperature 99.3 F (37.4 C), temperature source Oral, resp. rate 20, height _0  (1.676 m), weight 126 lb 5.2 oz (57.3 kg), SpO2  100 %. General: Alert and awake, oriented x3, not in any acute distress, underweight HEENT: anicteric sclera,  EOMI, oropharynx clear and without exudate, he is missing all his teeth on the top he only has a few on the bottom that are present Cardiovascular: regular rate, normal r,  no murmur rubs or gallops Pulmonary: Fairly clear to auscultation bilaterally, no wheezing, rales or rhonchi Gastrointestinal: soft nontender, nondistended, normal bowel sounds,  Skin, soft tissue:    On back where he had cyst removed number 23rd  2017:     Lesions on left shin 02/04/2016:       Aeration on right elbow where he fell 02/04/2016:      Neuro: nonfocal, strength and sensation intact   Results for orders placed or performed during the hospital encounter of 01/27/16 (from the past 48 hour(s))  CBC with Differential/Platelet     Status: Abnormal   Collection Time: 02/03/16  9:14 AM  Result Value Ref Range   WBC 17.6 (H) 4.0 - 10.5 K/uL   RBC 3.95 (L) 4.22 - 5.81 MIL/uL   Hemoglobin 8.3 (L) 13.0 - 17.0 g/dL   HCT 26.6 (L) 39.0 - 52.0 %   MCV 67.3 (L) 78.0 - 100.0 fL   MCH 21.0 (L) 26.0 - 34.0 pg   MCHC 31.2 30.0 - 36.0 g/dL   RDW 19.1 (H) 11.5 - 15.5 %   Platelets 703 (H) 150 - 400 K/uL   Neutrophils Relative % 80 %   Lymphocytes Relative 10 %   Monocytes Relative 9 %   Eosinophils Relative 1 %   Basophils Relative 0 %   Neutro Abs 14.0 (H) 1.7 - 7.7 K/uL   Lymphs Abs 1.8 0.7 - 4.0 K/uL   Monocytes Absolute 1.6 (H) 0.1 - 1.0 K/uL   Eosinophils Absolute 0.2 0.0 - 0.7 K/uL   Basophils Absolute 0.0 0.0 - 0.1 K/uL   RBC Morphology TARGET CELLS   Basic metabolic panel     Status: Abnormal   Collection Time: 02/03/16  9:14 AM  Result Value Ref Range   Sodium 134 (L) 135 - 145 mmol/L   Potassium 3.7 3.5 - 5.1 mmol/L   Chloride 102 101 - 111 mmol/L   CO2 24 22 - 32 mmol/L   Glucose, Bld 108 (H) 65 - 99 mg/dL   BUN 9 6 - 20 mg/dL   Creatinine, Ser 0.60 (L) 0.61 - 1.24 mg/dL   Calcium 8.6 (L) 8.9 - 10.3 mg/dL   GFR calc non Af Amer >60 >60 mL/min   GFR calc Af Amer >60 >60 mL/min    Comment: (NOTE) The eGFR has been calculated using the CKD EPI equation. This calculation has not been validated in all clinical situations. eGFR's persistently <60 mL/min signify possible Chronic Kidney Disease.    Anion gap 8 5 - 15  Vancomycin, trough     Status: Abnormal   Collection Time: 02/03/16  9:14 AM  Result Value Ref Range   Vancomycin Tr 9 (L) 15 - 20 ug/mL  Culture, blood (routine x 2)     Status:  None (Preliminary result)   Collection Time: 02/03/16  3:35 PM  Result Value Ref Range   Specimen Description BLOOD RIGHT ARM    Special Requests BOTTLES DRAWN AEROBIC ONLY 10CC    Culture NO GROWTH < 24 HOURS    Report Status PENDING   Culture, blood (routine x 2)     Status: None (Preliminary result)   Collection Time: 02/03/16  3:40 PM  Result Value Ref Range   Specimen Description BLOOD RIGHT HAND    Special Requests BOTTLES DRAWN AEROBIC ONLY 10CC    Culture NO GROWTH < 24 HOURS    Report Status PENDING   CBC     Status: Abnormal   Collection Time: 02/04/16  8:35 AM  Result Value Ref Range   WBC 18.5 (H) 4.0 - 10.5 K/uL   RBC 3.72 (L) 4.22 - 5.81 MIL/uL   Hemoglobin 7.6 (L) 13.0 - 17.0 g/dL   HCT 25.0 (L) 39.0 - 52.0 %   MCV 67.2 (L) 78.0 - 100.0 fL   MCH 20.4 (L) 26.0 - 34.0 pg   MCHC 30.4 30.0 - 36.0 g/dL   RDW 19.3 (H) 11.5 - 15.5 %   Platelets 651 (H) 150 - 400 K/uL   _0 (sdes,specrequest,cult,reptstatus)   ) Recent Results (from the past 720 hour(s))  Culture, blood (routine x 2)     Status: None   Collection Time: 01/30/16  9:00 AM  Result Value Ref Range Status   Specimen Description BLOOD RIGHT ANTECUBITAL  Final   Special Requests BOTTLES DRAWN AEROBIC AND ANAEROBIC 10CC  Final   Culture NO GROWTH 5 DAYS  Final   Report Status 02/04/2016 FINAL  Final  Culture, blood (routine x 2)     Status: None   Collection Time: 01/30/16  9:15 AM  Result Value Ref Range Status   Specimen Description BLOOD RIGHT ANTECUBITAL  Final   Special Requests BOTTLES DRAWN AEROBIC ONLY [place]  Final   Culture NO GROWTH 5 DAYS  Final   Report Status 02/04/2016 FINAL  Final  Culture, blood (routine x 2)     Status: None (Preliminary result)   Collection Time: 02/03/16  3:35 PM  Result Value Ref Range Status   Specimen Description BLOOD RIGHT ARM  Final   Special Requests BOTTLES DRAWN AEROBIC ONLY 10CC  Final   Culture NO GROWTH < 24 HOURS  Final   Report Status PENDING   Incomplete  Culture, blood (routine x 2)     Status: None (Preliminary result)   Collection Time: 02/03/16  3:40 PM  Result Value Ref Range Status   Specimen Description BLOOD RIGHT HAND  Final   Special Requests BOTTLES DRAWN AEROBIC ONLY 10CC  Final   Culture NO GROWTH < 24 HOURS  Final   Report Status PENDING  Incomplete     Impression/Recommendation  Principal Problem:   Mass of right lung Active Problems:   Leg pain, inferior, left   Microcytic hypochromic anemia   Lung mass   Protein-calorie malnutrition, severe   Postobstructive pneumonia   PVD (peripheral vascular disease) ([facility])   Lumbar transverse process fracture ([facility])   Tobacco abuse   Alcohol abuse   AKHIL PISCOPO is a 55 y.o. male with Recently diagnosed lung cancer on bronchoscopy also being treated for postobstructive pneumonia was had persistent fevers over 101.  #1 Fever of unknown origin: No clear-cut source beyond his postobstructive pneumonia which other than his fevers has seemed to improve.  Vancomycin has been discontinued bowel levels Zosyn to continue for now.  I will check some other FUO labs including labs for HIV and viral hepatitides  I suppose the cancer itself could be causing some of his fevers.  I'll repeat a chest x-ray of him with PA and lateral the morning.  I'll get a Panorex to assess his dentition for an occult abscess     02/04/2016, 4:57 PM  Thank you so much for this interesting consult  [place] [facility] [phone] (pager) [phone] (office) 02/04/2016, 4:57 PM  [place] 02/04/2016, 4:57 PM

## 2016-02-04 NOTE — Progress Notes (Signed)
PROGRESS NOTE        PATIENT DETAILS Name: [name] Chapman Age: 55 y.o. Sex: male Date of Birth: 1960-11-09 Admit Date: 01/27/2016 Admitting Physician Rise Patience, MD PCP:No PCP Per Patient  Brief Narrative: Patient is a 55 y.o. malewith long-standing history of tobacco use, presented to the hospital for evaluation of claudication pain involving his left lower extremity. This was evaluated by a CT angiogram of the chest, abdomen/pelvis which demonstrated a large right lung mass. 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 Hospital course complicated by persistent fever felt to be secondary to postobstructive pneumonia. See below for further details   Subjective: No major complaints-febrile last night again.Some cough  Assessment/Plan: Right lower lobe lung mass: Long-standing history of tobacco use. 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 MRI brain completed which was negative. Will need outpatient PET/CT. Oncology follow up with Dr. Irene Limbo in 1 week.   Fever: Continues to febrile-probably either secondary to postobstructive pneumonia or tumor fever (per IR note-majority of the lung mass appeared necrotic on CT).blood cultures on 11/18 negative, awaiting repeat blood cultures on 11/22. Bilateral lower extremity Dopplers on 11/19 were also negative for DVT. CT of the abdomen on admission did not show any obvious infection. Case discussed with infectious disease-recommendations were to stop all antibiotics, I strongly suspect that this is probably tumor fever. 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 Vascular surgery consulted-likely chronic occulusion, recommendations are to percutaneous revascularization once medical issues stable. Spoke with vascular surgery Silva Bandy PA-C on 11/23-recommendations are to pursue further investigations in the outpatient setting.  Right L3/L4 transverse process fracture: Supportive care. No significant trauma history.   Normocytic anemia:No history of obvious blood loss. High suspicion for anemia due to malignancy. 1u pRBC transfused 11/16. Stable.   Thrombocytosis: Likely secondary to acute illness and likely malignancy as above, trend CBC    Tobacco abuse: Counseled, continue transdermal nicotine.  EtOH use: Drinks approximately 40-80 ounces of beer daily. Last drink on 11/15. No signs of withdrawal.  Severe protein calorie malnutrition: Continue supplements  DVT Prophylaxis: Start Prophylactic Lovenox   Code Status: Full code   Family Communication: None at bedside  Disposition Plan: Remain inpatient-home in 1-2 days  Antimicrobial agents: See below  Procedures:  Bronchoscopy 11/  CONSULTS:  Pulmonology  Oncology  IR   Vascular sx   Time spent: 25 minutes-Greater than 50% of this time was spent in counseling, explanation of diagnosis, planning of further management, and coordination of care.  MEDICATIONS: Anti-infectives    Start     Dose/Rate Route Frequency Ordered Stop   02/03/16 2000  vancomycin (VANCOCIN) IVPB 1000 mg/200 mL premix  Status:  Discontinued     1,000 mg 200 mL/hr over 60 Minutes Intravenous Every 8 hours 02/03/16 1048 02/03/16 1243   02/03/16 1100  vancomycin (VANCOCIN) IVPB 1000 mg/200 mL premix  1,000 mg 200 mL/hr over 60 Minutes  Intravenous NOW 02/03/16 1048 02/03/16 1327   02/01/16 1800  vancomycin (VANCOCIN) 500 mg in sodium chloride 0.9 % 100 mL IVPB  Status:  Discontinued     500 mg 100 mL/hr over 60 Minutes Intravenous Every 8 hours 02/01/16 0901 02/03/16 1048   02/01/16 1000  piperacillin-tazobactam (ZOSYN) IVPB 3.375 g  Status:  Discontinued     3.375 g 12.5 mL/hr over 240 Minutes Intravenous Every 8 hours 02/01/16 0901 02/04/16 1314   02/01/16 0900  vancomycin (VANCOCIN) IVPB 1000 mg/200 mL premix     1,000 mg 200 mL/hr over 60 Minutes Intravenous  Once 02/01/16 0901 02/01/16 1101   01/31/16 0900  ampicillin-sulbactam (UNASYN) 1.5 g in sodium chloride 0.9 % 50 mL IVPB  Status:  Discontinued     1.5 g 100 mL/hr over 30 Minutes Intravenous Every 6 hours 01/31/16 0756 02/01/16 0758      Scheduled Meds: . enoxaparin (LOVENOX) injection  40 mg Subcutaneous Q24H  . feeding supplement (ENSURE ENLIVE)  237 mL Oral BID BM  . folic acid  1 mg Oral Daily  . multivitamin with minerals  1 tablet Oral Daily  . nicotine  14 mg Transdermal QHS  . thiamine  100 mg Oral Daily   Or  . thiamine  100 mg Intravenous Daily   Continuous Infusions: . sodium chloride 10 mL (01/29/16 1015)   PRN Meds:.acetaminophen **OR** acetaminophen, albuterol, ipratropium-albuterol, ondansetron **OR** ondansetron (ZOFRAN) IV   PHYSICAL EXAM: Vital signs: Vitals:   02/03/16 1345 02/03/16 2309 02/04/16 0030 02/04/16 0614  BP: 107/67 105/64  110/65  Pulse: 77 98  93  Resp:  20  20  Temp: 98.2 F (36.8 C) (!) 101.6 F (38.7 C) 99.4 F (37.4 C) 98.1 F (36.7 C)  TempSrc: Oral Oral Oral Oral  SpO2: 99% 100%  100%  Weight:      Height:       Filed Weights   01/27/16 2155 01/29/16 1005  Weight: 57.3 kg (126 lb 5.2 oz) 57.3 kg (126 lb 5.2 oz)   Body mass index is 20.39 kg/m.   General appearance :Awake, alert, not in any distress.Chronically sick appearing Eyes:, pupils equally reactive to light and accomodation,no scleral  icterus.Pink conjunctiva HEENT: Atraumatic and Normocephalic Neck: supple, no JVD. No cervical lymphadenopathy. No thyromegaly Resp:Good air entry bilaterally, no added sounds  CVS: S1 S2 regular, no murmurs.  GI: Bowel sounds present, Non tender and not distended with no gaurding, rigidity or rebound.No organomegaly Extremities: B/L Lower Ext shows no edema, both legs are warm to touch Neurology:  speech clear,Non focal, sensation is grossly intact. Psychiatric: Normal judgment and insight. Alert and oriented x 3. Normal mood. Musculoskeletal:No digital cyanosis Skin:No Rash, warm and dry Wounds:N/A  I have personally reviewed following labs and imaging studies  LABORATORY DATA: CBC:  Recent Labs Lab 01/30/16 0535 01/31/16 0627 02/01/16 0427 02/02/16 0830 02/03/16 0914 02/04/16 0835  WBC 17.0* 16.0* 16.3* 15.3* 17.6* 18.5*  NEUTROABS 13.1* 12.1* 11.8* 12.0* 14.0*  --   HGB 7.9* 8.4* 7.9* 7.6* 8.3* 7.6*  HCT 25.3* 26.8* 25.7* 24.8* 26.6* 25.0*  MCV 66.6* 67.0* 67.3* 67.2* 67.3* 67.2*  PLT 730* 688* 689* 608* 703* 651*    Basic Metabolic Panel:  Recent Labs Lab 01/30/16 0535 01/31/16 0627 02/01/16 0427 02/02/16 0830 02/03/16 0914  NA 132* 130* 133* 130* 134*  K 4.3 4.3 4.3 4.1 3.7  CL 100* 97* 99* 98* 102  CO2 23  $'23 24 22 24  'Z$ GLUCOSE 93 113* 94 91 108*  BUN '10 10 10 10 9  '$ CREATININE 0.48* 0.61 0.55* 0.54* 0.60*  CALCIUM 8.7* 8.7* 8.6* 8.5* 8.6*    GFR: Estimated Creatinine Clearance: 84.6 mL/min (by C-G formula based on SCr of 0.6 mg/dL (L)).  Liver Function Tests: No results for input(s): AST, ALT, ALKPHOS, BILITOT, PROT, ALBUMIN in the last 168 hours. No results for input(s): LIPASE, AMYLASE in the last 168 hours. No results for input(s): AMMONIA in the last 168 hours.  Coagulation Profile: No results for input(s): INR, PROTIME in the last 168 hours.  Cardiac Enzymes: No results for input(s): CKTOTAL, CKMB, CKMBINDEX, TROPONINI in the last 168  hours.  BNP (last 3 results) No results for input(s): PROBNP in the last 8760 hours.  HbA1C: No results for input(s): HGBA1C in the last 72 hours.  CBG: No results for input(s): GLUCAP in the last 168 hours.  Lipid Profile: No results for input(s): CHOL, HDL, LDLCALC, TRIG, CHOLHDL, LDLDIRECT in the last 72 hours.  Thyroid Function Tests: No results for input(s): TSH, T4TOTAL, FREET4, T3FREE, THYROIDAB in the last 72 hours.  Anemia Panel: No results for input(s): VITAMINB12, FOLATE, FERRITIN, TIBC, IRON, RETICCTPCT in the last 72 hours.  Urine analysis:    Component Value Date/Time   COLORURINE YELLOW 01/27/2016 [place] 01/27/2016 1246   LABSPEC 1.022 01/27/2016 1246   PHURINE 6.0 01/27/2016 1246   GLUCOSEU NEGATIVE 01/27/2016 1246   HGBUR NEGATIVE 01/27/2016 [place] 01/27/2016 1246   KETONESUR NEGATIVE 01/27/2016 [place] 01/27/2016 1246   NITRITE NEGATIVE 01/27/2016 1246   LEUKOCYTESUR NEGATIVE 01/27/2016 1246    Sepsis Labs: Lactic Acid, Venous No results found for: LATICACIDVEN  MICROBIOLOGY: Recent Results (from the past 240 hour(s))  Culture, blood (routine x 2)     Status: None (Preliminary result)   Collection Time: 01/30/16  9:00 AM  Result Value Ref Range Status   Specimen Description BLOOD RIGHT ANTECUBITAL  Final   Special Requests BOTTLES DRAWN AEROBIC AND ANAEROBIC 10CC  Final   Culture NO GROWTH 4 DAYS  Final   Report Status PENDING  Incomplete  Culture, blood (routine x 2)     Status: None (Preliminary result)   Collection Time: 01/30/16  9:15 AM  Result Value Ref Range Status   Specimen Description BLOOD RIGHT ANTECUBITAL  Final   Special Requests BOTTLES DRAWN AEROBIC ONLY [place]  Final   Culture NO GROWTH 4 DAYS  Final   Report Status PENDING  Incomplete    RADIOLOGY STUDIES/RESULTS: Dg Chest 2 View  Result Date: 01/27/2016 CLINICAL DATA:  Intermittent left lower extremity pain after  the last year. 30 pound weight loss over the last 2 months. EXAM: CHEST  2 VIEW COMPARISON:  Chest CT 06/16/2007. FINDINGS: Heart size is normal. Mediastinal shadows are normal. Left lung is clear. There is consolidation and collapse in the right lower lobe. Right upper lobe is clear. Some volume loss in the right middle lobe. There is some pleural density on the right laterally. IMPRESSION: Consolidation and volume loss in the right lower lobe. Mild volume loss in the right middle lobe. Pleural density on the right laterally. Findings could be due to chronic pneumonia with volume loss, but the possibility of malignant disease does exist. Chest CT with contrast suggested when able. Electronically Signed   By: Nelson Chimes M.D.   On: 01/27/2016 12:59   Mr Jeri Cos NI  Contrast  Result Date: 01/30/2016 CLINICAL DATA:  Newly diagnosed lung cancer. Evaluation for intracranial metastatic disease. EXAM: MRI HEAD WITHOUT AND WITH CONTRAST TECHNIQUE: Multiplanar, multiecho pulse sequences of the brain and surrounding structures were obtained without and with intravenous contrast. CONTRAST:  61m MULTIHANCE GADOBENATE DIMEGLUMINE 529 MG/ML IV SOLN COMPARISON:  Head CT 06/16/2007 FINDINGS: Brain: No acute infarct or intraparenchymal hemorrhage. The midline structures are normal. No focal parenchymal signal abnormality. No mass lesion or midline shift. No hydrocephalus or extra-axial fluid collection. No contrast-enhancing lesions. Vascular: Major intracranial arterial and venous sinus flow voids are preserved. No evidence of chronic microhemorrhage or amyloid angiopathy. Skull and upper cervical spine: The visualized skull base, calvarium, upper cervical spine and extracranial soft tissues are normal. Sinuses/Orbits: Complete opacification of the left maxillary sinus. Small amount of bilateral mastoid fluid. Normal orbits. IMPRESSION: No intracranial metastatic disease. Electronically Signed   By: KUlyses JarredM.D.   On:  01/30/2016 00:40   Dg Chest Port 1 View  Result Date: 02/03/2016 CLINICAL DATA:  Chest mass. EXAM: PORTABLE CHEST 1 VIEW COMPARISON:  Radiograph of January 29, 2016. CT scan of January 27, 2016. FINDINGS: The heart size and mediastinal contours are within normal limits. Left lung is clear. No pneumothorax is noted. Stable appearance of large right lower lobe lung mass is noted with associated minimal pleural effusion. The visualized skeletal structures are unremarkable. IMPRESSION: Stable appearance of large right lung mass is noted as described on prior exams. Electronically Signed   By: JMarijo Conception M.D.   On: 02/03/2016 13:05   Dg Chest Port 1 View  Result Date: 01/29/2016 CLINICAL DATA:  Status post bronchoscopic biopsy. EXAM: PORTABLE CHEST 1 VIEW COMPARISON:  Chest x-ray and chest CT 01/27/2016 FINDINGS: Large right lower lobe lung mass is again demonstrated with right hilar adenopathy. No postprocedural pneumothorax is identified. The left lung remains clear. IMPRESSION: Stable large right lower lobe lung mass. No postprocedural pneumothorax. Electronically Signed   By: PMarijo SanesM.D.   On: 01/29/2016 12:26   Ct Angio Chest/abd/pel For Dissection W And/or W/wo  Result Date: 01/27/2016 CLINICAL DATA:  Intermittent LEFT lower extremity pain for 1 year and intermittent foot numbness. 30 pound weight loss in 2 months. Follow-up possible chest malignancy. EXAM: CT ANGIOGRAPHY CHEST, ABDOMEN AND PELVIS TECHNIQUE: Multidetector CT imaging through the chest, abdomen and pelvis was performed using the standard protocol during bolus administration of intravenous contrast. Multiplanar reconstructed images and MIPs were obtained and reviewed to evaluate the vascular anatomy. CONTRAST:  80 cc Isovue 370 COMPARISON:  Chest radiograph January 27, 2016 at 1238 hours FINDINGS: CTA CHEST FINDINGS CARDIOVASCULAR: Thoracic aorta is normal course and caliber. No intrinsic density on noncontrast CT.  Homogeneous contrast opacification of thoracic aorta without dissection, aneurysm, luminal irregularity, periaortic fluid collections, or contrast extravasation. Trace coronary artery calcifications. Heart size is normal. No pericardial effusion. MEDIASTINUM/NODES: 2.5 mm sub carinal nodal conglomeration. 12 mm RIGHT anterior mediastinal/ prevascular necrotic lymph node. RIGHT hilar lymphadenopathy contiguous with RIGHT lung mass. LUNGS/PLEURA: 9.1 x 13.2 x 13.9 cm RIGHT lung base mass contiguous with the RIGHT hilum, and is contiguous with the diaphragm and pleura. Effaced RIGHT lower lobe bronchi. RIGHT hilar lymphadenopathy narrows the RIGHT middle lobe bronchi. Mass encases the RIGHT lower lobe pulmonary arteries which are patent. No chest wall invasion. Patchy ground-glass nodules and opacities RIGHT lower lobe. LEFT lung base dependent atelectasis. Mild centrilobular emphysema. 3 mm RIGHT middle lobe pulmonary nodule versus granuloma. MUSCULOSKELETAL: Nonsuspicious. Sub  cm LEFT thyroid nodule below size followup recommendations. Review of the MIP images confirms the above findings. CTA ABDOMEN AND PELVIS FINDINGS ARTERIES: Abdominal aorta is normal course and caliber. Mild calcific atherosclerosis. 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 Reconstitution of distal LEFT external iliac artery. Reconstitution of distal LEFT internal iliac arteries. Homogeneous contrast opacification of the aorta vessels without dissection, aneurysm, luminal irregularity, periaortic fluid collections, or contrast extravasation. Celiac axis, superior and inferior mesenteric arteries are patent. Moderate stenosis inferior mesenteric artery origin. HEPATOBILIARY: Liver and gallbladder are normal. PANCREAS: Normal. SPLEEN: Normal. ADRENALS/URINARY TRACT: Kidneys are orthotopic, demonstrating symmetric enhancement. No nephrolithiasis, hydronephrosis or solid renal masses. 4.8 cm RIGHT  interpolar benign-appearing cyst. The unopacified ureters are normal in course and caliber. Urinary bladder is well distended and unremarkable. Normal adrenal glands. STOMACH/BOWEL: The stomach, small and large bowel are normal in course and caliber without inflammatory changes decreased sensitivity without enteric contrast. Normal appendix. VASCULAR/LYMPHATIC: No lymphadenopathy by CT size criteria. REPRODUCTIVE: Normal. OTHER: No intraperitoneal free fluid or free air. MUSCULOSKELETAL: Nonacute. Bridging sacroiliac osteophytes. Nondisplaced RIGHT L3, L4 healing transverse process fractures. Review of the MIP images confirms the above findings. IMPRESSION: CTA CHEST: 9 x 1 x 13.2 x 13.9 cm RIGHT lung mass contiguous with the hilum compatible with neoplasm, primary lung versus lymphoma. Mediastinal lymphadenopathy. RIGHT lower lobe probable postobstructive pneumonia. Small RIGHT pleural effusion. Moderate emphysema. No acute vascular process. 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 Reconstitution distal LEFT internal external iliac arteries. Mild atherosclerosis. Acute to subacute nondisplaced RIGHT L3 and L4 transverse process fractures appear traumatic, not pathologic. Acute findings discussed with and reconfirmed by Dr.WARREN JONES on 01/27/2016 at 5:45 pm. Electronically Signed   By: Elon Alas M.D.   On: 01/27/2016 17:50     LOS: 7 days   Oren Binet, MD  [facility] Pager:[phone]  If 7PM-7AM, please contact night-coverage [email or web address] Password [email or web address] 02/04/2016, 1:14 PM

## 2016-02-05 ENCOUNTER — Inpatient Hospital Stay (HOSPITAL_COMMUNITY): Payer: Medicaid Other

## 2016-02-05 DIAGNOSIS — S32009K Unspecified fracture of unspecified lumbar vertebra, subsequent encounter for fracture with nonunion: Secondary | ICD-10-CM

## 2016-02-05 DIAGNOSIS — R509 Fever, unspecified: Secondary | ICD-10-CM

## 2016-02-05 LAB — SEDIMENTATION RATE: SED RATE: 140 mm/h — AB (ref 0–16)

## 2016-02-05 LAB — CK: Total CK: 11 U/L — ABNORMAL LOW (ref 49–397)

## 2016-02-05 LAB — HIV ANTIBODY (ROUTINE TESTING W REFLEX): HIV SCREEN 4TH GENERATION: NONREACTIVE

## 2016-02-05 LAB — C-REACTIVE PROTEIN: CRP: 19.1 mg/dL — AB (ref <1.0)

## 2016-02-05 MED ORDER — IOPAMIDOL (ISOVUE-300) INJECTION 61%
INTRAVENOUS | Status: AC
  Administered 2016-02-05: 75 mL
  Filled 2016-02-05: qty 75

## 2016-02-05 NOTE — Progress Notes (Signed)
PROGRESS NOTE        PATIENT DETAILS Name: [name] Chapman Age: 55 y.o. Sex: male Date of Birth: 12-11-1960 Admit Date: 01/27/2016 Admitting Physician Rise Patience, MD PCP:No PCP Per Patient  Brief Narrative: Patient is a 55 y.o. malewith long-standing history of tobacco use, presented to the hospital for evaluation of claudication pain involving his left lower extremity. This was evaluated by a CT angiogram of the chest, abdomen/pelvis which demonstrated a large right lung mass. 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 Hospital course complicated by persistent fever felt to be secondary to postobstructive pneumonia. See below for further details   Subjective: No major complaints-febrile last night again.Some cough  Assessment/Plan: Right lower lobe lung mass: Long-standing history of tobacco use. 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 MRI brain completed which was negative. Will need outpatient PET/CT. Oncology follow up with Dr. Irene Limbo in 1 week.   Fever: Continues to febrile-probably either secondary to postobstructive pneumonia or tumor fever  (per IR note-majority of the lung mass appeared necrotic on CT) or drug fever.Blood cultures on 11/18 and 11/22 negative. Bilateral lower extremity Dopplers on 11/19 were also negative for DVT. CT of the abdomen on admission did not show any obvious infection. Case discussed with infectious disease M.D. today, recommendations were to repeat CT chest which has been ordered. Continue Zosyn and follow fever curve and clinical course. He has persistent leukocytosis, I suspect that is secondary to tumor with necrotic mass rather than worsening infection.  Peripheralvascular disease with left leg claudication  pain:Patient has had claudication involving his left lower extremity for approximately 1 year, he can now only walk approximately 100 feet before he starts having pain. 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 Vascular surgery consulted-likely chronic occulusion, recommendations are to percutaneous revascularization once medical issues stable. Spoke with vascular surgery Silva Bandy PA-C on 11/23-recommendations are to pursue further investigations in the outpatient setting.  Right L3/L4 transverse process fracture: Supportive care. No significant trauma history.   Normocytic anemia:No history of obvious blood loss. High suspicion for anemia due to malignancy. 1u pRBC transfused 11/16. Stable.   Thrombocytosis: Likely secondary to acute illness and likely malignancy as above, trend CBC    Tobacco abuse: Counseled, continue transdermal nicotine.  EtOH use: Drinks approximately 40-80 ounces of beer daily. Last drink on 11/15. No signs of withdrawal.  Severe protein calorie malnutrition: Continue supplements  DVT Prophylaxis: Start Prophylactic Lovenox   Code Status: Full code   Family Communication: Spouse and sister at bedside  Disposition Plan: Remain inpatient-home in 1-2 days  Antimicrobial agents: See below  Procedures:  Bronchoscopy 11/17  CONSULTS:  Pulmonology  Oncology  IR   Vascular sx   Time spent: 25 minutes-Greater than 50% of this time was spent in counseling, explanation of diagnosis, planning of further management, and coordination of care.  MEDICATIONS: Anti-infectives    Start     Dose/Rate Route Frequency Ordered Stop   02/04/16 1800  piperacillin-tazobactam (ZOSYN) IVPB 3.375 g     3.375 g 12.5 mL/hr over 240 Minutes Intravenous Every 8 hours 02/04/16 1702     02/03/16 2000  vancomycin (VANCOCIN) IVPB 1000 mg/200 mL premix  Status:   Discontinued     1,000 mg 200 mL/hr over 60 Minutes Intravenous Every 8 hours 02/03/16 1048 02/03/16 1243   02/03/16 1100  vancomycin (VANCOCIN) IVPB 1000 mg/200 mL premix     1,000 mg 200 mL/hr over 60 Minutes Intravenous NOW 02/03/16 1048 02/03/16 1327   02/01/16 1800  vancomycin (VANCOCIN) 500 mg in sodium chloride 0.9 % 100 mL IVPB  Status:  Discontinued     500 mg 100 mL/hr over 60 Minutes Intravenous Every 8 hours 02/01/16 0901 02/03/16 1048   02/01/16 1000  piperacillin-tazobactam (ZOSYN) IVPB 3.375 g  Status:  Discontinued     3.375 g 12.5 mL/hr over 240 Minutes Intravenous Every 8 hours 02/01/16 0901 02/04/16 1314   02/01/16 0900  vancomycin (VANCOCIN) IVPB 1000 mg/200 mL premix     1,000 mg 200 mL/hr over 60 Minutes Intravenous  Once 02/01/16 0901 02/01/16 1101   01/31/16 0900  ampicillin-sulbactam (UNASYN) 1.5 g in sodium chloride 0.9 % 50 mL IVPB  Status:  Discontinued     1.5 g 100 mL/hr over 30 Minutes Intravenous Every 6 hours 01/31/16 0756 02/01/16 0758      Scheduled Meds: . iopamidol      . enoxaparin (LOVENOX) injection  40 mg Subcutaneous Q24H  . feeding supplement (ENSURE ENLIVE)  237 mL Oral BID BM  . folic acid  1 mg Oral Daily  . multivitamin with minerals  1 tablet Oral Daily  . nicotine  14 mg Transdermal QHS  . piperacillin-tazobactam (ZOSYN)  IV  3.375 g Intravenous Q8H  . thiamine  100 mg Oral Daily   Or  . thiamine  100 mg Intravenous Daily   Continuous Infusions: . sodium chloride 10 mL (01/29/16 1015)   PRN Meds:.acetaminophen **OR** acetaminophen, albuterol, ipratropium-albuterol, ondansetron **OR** ondansetron (ZOFRAN) IV   PHYSICAL EXAM: Vital signs: Vitals:   02/04/16 1449 02/05/16 0009 02/05/16 0115 02/05/16 0526  BP: 116/73 105/61  107/65  Pulse: 90 95  87  Resp: '20 18  18  '$ Temp: 99.3 F (37.4 C) (!) 101.5 F (38.6 C) 98.8 F (37.1 C) 99.5 F (37.5 C)  TempSrc: Oral Oral Oral   SpO2: 100% 98%  99%  Weight:    61.9 kg (136 lb 8  oz)  Height:       Filed Weights   01/27/16 2155 01/29/16 1005 02/05/16 0526  Weight: 57.3 kg (126 lb 5.2 oz) 57.3 kg (126 lb 5.2 oz) 61.9 kg (136 lb 8 oz)   Body mass index is 22.03 kg/m.   General appearance :Awake, alert, not in any distress.Chronically sick appearing and cachectic Eyes:, pupils equally reactive to light and accomodation,no scleral icterus.Pink conjunctiva HEENT: Atraumatic and Normocephalic Neck: supple, no JVD. No cervical lymphadenopathy. No thyromegaly Resp:Good air entry bilaterally, no added sounds  CVS: S1 S2 regular, no murmurs.  GI: Bowel sounds present, Non tender and not distended with no gaurding, rigidity or rebound.No organomegaly Extremities: B/L Lower Ext shows no edema, both legs are warm to touch Neurology:  speech clear,Non focal, sensation is grossly intact. Psychiatric: Normal judgment and insight. Alert and oriented x 3. Normal mood. Musculoskeletal:No digital cyanosis Skin:No Rash, warm and dry Wounds:N/A  I have personally reviewed following labs and imaging studies  LABORATORY DATA: CBC:  Recent Labs Lab 01/30/16 0535 01/31/16 0627 02/01/16 0427 02/02/16 0830 02/03/16 0914 02/04/16 0835  WBC 17.0* 16.0* 16.3* 15.3* 17.6* 18.5*  NEUTROABS 13.1* 12.1* 11.8* 12.0* 14.0*  --  HGB 7.9* 8.4* 7.9* 7.6* 8.3* 7.6*  HCT 25.3* 26.8* 25.7* 24.8* 26.6* 25.0*  MCV 66.6* 67.0* 67.3* 67.2* 67.3* 67.2*  PLT 730* 688* 689* 608* 703* 651*    Basic Metabolic Panel:  Recent Labs Lab 01/30/16 0535 01/31/16 0627 02/01/16 0427 02/02/16 0830 02/03/16 0914  NA 132* 130* 133* 130* 134*  K 4.3 4.3 4.3 4.1 3.7  CL 100* 97* 99* 98* 102  CO2 '23 23 24 22 24  '$ GLUCOSE 93 113* 94 91 108*  BUN '10 10 10 10 9  '$ CREATININE 0.48* 0.61 0.55* 0.54* 0.60*  CALCIUM 8.7* 8.7* 8.6* 8.5* 8.6*    GFR: Estimated Creatinine Clearance: 91.3 mL/min (by C-G formula based on SCr of 0.6 mg/dL (L)).  Liver Function Tests: No results for input(s): AST, ALT,  ALKPHOS, BILITOT, PROT, ALBUMIN in the last 168 hours. No results for input(s): LIPASE, AMYLASE in the last 168 hours. No results for input(s): AMMONIA in the last 168 hours.  Coagulation Profile: No results for input(s): INR, PROTIME in the last 168 hours.  Cardiac Enzymes:  Recent Labs Lab 02/05/16 0645  CKTOTAL 11*    BNP (last 3 results) No results for input(s): PROBNP in the last 8760 hours.  HbA1C: No results for input(s): HGBA1C in the last 72 hours.  CBG: No results for input(s): GLUCAP in the last 168 hours.  Lipid Profile: No results for input(s): CHOL, HDL, LDLCALC, TRIG, CHOLHDL, LDLDIRECT in the last 72 hours.  Thyroid Function Tests: No results for input(s): TSH, T4TOTAL, FREET4, T3FREE, THYROIDAB in the last 72 hours.  Anemia Panel: No results for input(s): VITAMINB12, FOLATE, FERRITIN, TIBC, IRON, RETICCTPCT in the last 72 hours.  Urine analysis:    Component Value Date/Time   COLORURINE YELLOW 01/27/2016 [place] 01/27/2016 1246   LABSPEC 1.022 01/27/2016 1246   PHURINE 6.0 01/27/2016 1246   GLUCOSEU NEGATIVE 01/27/2016 1246   HGBUR NEGATIVE 01/27/2016 [place] 01/27/2016 [place] 01/27/2016 [place] 01/27/2016 1246   NITRITE NEGATIVE 01/27/2016 1246   LEUKOCYTESUR NEGATIVE 01/27/2016 1246    Sepsis Labs: Lactic Acid, Venous No results found for: LATICACIDVEN  MICROBIOLOGY: Recent Results (from the past 240 hour(s))  Culture, blood (routine x 2)     Status: None   Collection Time: 01/30/16  9:00 AM  Result Value Ref Range Status   Specimen Description BLOOD RIGHT ANTECUBITAL  Final   Special Requests BOTTLES DRAWN AEROBIC AND ANAEROBIC 10CC  Final   Culture NO GROWTH 5 DAYS  Final   Report Status 02/04/2016 FINAL  Final  Culture, blood (routine x 2)     Status: None   Collection Time: 01/30/16  9:15 AM  Result Value Ref Range Status   Specimen Description BLOOD RIGHT  ANTECUBITAL  Final   Special Requests BOTTLES DRAWN AEROBIC ONLY [place]  Final   Culture NO GROWTH 5 DAYS  Final   Report Status 02/04/2016 FINAL  Final  Culture, blood (routine x 2)     Status: None (Preliminary result)   Collection Time: 02/03/16  3:35 PM  Result Value Ref Range Status   Specimen Description BLOOD RIGHT ARM  Final   Special Requests BOTTLES DRAWN AEROBIC ONLY 10CC  Final   Culture NO GROWTH < 24 HOURS  Final   Report Status PENDING  Incomplete  Culture, blood (routine x 2)     Status: None (Preliminary result)   Collection Time: 02/03/16  3:40 PM  Result Value Ref Range Status   Specimen Description BLOOD RIGHT HAND  Final   Special Requests BOTTLES DRAWN AEROBIC ONLY 10CC  Final   Culture NO GROWTH < 24 HOURS  Final   Report Status PENDING  Incomplete    RADIOLOGY STUDIES/RESULTS: Dg Orthopantogram  Result Date: 02/04/2016 CLINICAL DATA:  Fever of unknown origin EXAM: ORTHOPANTOGRAM/PANORAMIC COMPARISON:  None. FINDINGS: The patient is missing all of his maxillary teeth and many of his mandibular teeth. The single remaining tooth in the right mandible demonstrates mild periapical lucency. No other abnormalities. IMPRESSION: There is suggested minimal periapical lucency adjacent to the root of the single remaining right mandibular tooth. Electronically Signed   By: Dorise Bullion III M.D   On: 02/04/2016 17:48   Dg Chest 2 View  Result Date: 02/05/2016 CLINICAL DATA:  Fever EXAM: CHEST  2 VIEW COMPARISON:  February 03, 2016 chest radiograph and chest CT January 27, 2016 FINDINGS: There is a persistent large mass occupying much of the right lower lobe with probable surrounding pneumonitis. Lungs elsewhere clear. Heart size and pulmonary vascularity are normal. No adenopathy. Adenopathy is noted in the sub- carinal and right hilar regions, better appreciated on CT. No evident bone lesions. IMPRESSION: Large mass right lower lobe with probable surrounding pneumonitis. No  new opacity. Sub- carinal and right hilar adenopathy, better seen by CT. Electronically Signed   By: Lowella Grip III M.D.   On: 02/05/2016 10:04   Dg Chest 2 View  Result Date: 01/27/2016 CLINICAL DATA:  Intermittent left lower extremity pain after the last year. 30 pound weight loss over the last 2 months. EXAM: CHEST  2 VIEW COMPARISON:  Chest CT 06/16/2007. FINDINGS: Heart size is normal. Mediastinal shadows are normal. Left lung is clear. There is consolidation and collapse in the right lower lobe. Right upper lobe is clear. Some volume loss in the right middle lobe. There is some pleural density on the right laterally. IMPRESSION: Consolidation and volume loss in the right lower lobe. Mild volume loss in the right middle lobe. Pleural density on the right laterally. Findings could be due to chronic pneumonia with volume loss, but the possibility of malignant disease does exist. Chest CT with contrast suggested when able. Electronically Signed   By: Nelson Chimes M.D.   On: 01/27/2016 12:59   Mr Jeri Cos VZ Contrast  Result Date: 01/30/2016 CLINICAL DATA:  Newly diagnosed lung cancer. Evaluation for intracranial metastatic disease. EXAM: MRI HEAD WITHOUT AND WITH CONTRAST TECHNIQUE: Multiplanar, multiecho pulse sequences of the brain and surrounding structures were obtained without and with intravenous contrast. CONTRAST:  70m MULTIHANCE GADOBENATE DIMEGLUMINE 529 MG/ML IV SOLN COMPARISON:  Head CT 06/16/2007 FINDINGS: Brain: No acute infarct or intraparenchymal hemorrhage. The midline structures are normal. No focal parenchymal signal abnormality. No mass lesion or midline shift. No hydrocephalus or extra-axial fluid collection. No contrast-enhancing lesions. Vascular: Major intracranial arterial and venous sinus flow voids are preserved. No evidence of chronic microhemorrhage or amyloid angiopathy. Skull and upper cervical spine: The visualized skull base, calvarium, upper cervical spine and  extracranial soft tissues are normal. Sinuses/Orbits: Complete opacification of the left maxillary sinus. Small amount of bilateral mastoid fluid. Normal orbits. IMPRESSION: No intracranial metastatic disease. Electronically Signed   By: KUlyses JarredM.D.   On: 01/30/2016 00:40   Dg Chest Port 1 View  Result Date: 02/03/2016 CLINICAL DATA:  Chest mass. EXAM: PORTABLE CHEST 1 VIEW COMPARISON:  Radiograph of January 29, 2016. CT scan of January 27, 2016. FINDINGS: The heart size and mediastinal contours are within normal limits. Left lung is clear. No pneumothorax is noted. Stable appearance of large right lower lobe lung mass is noted with associated minimal pleural effusion. The visualized skeletal structures are unremarkable. IMPRESSION: Stable appearance of large right lung mass is noted as described on prior exams. Electronically Signed   By: Marijo Conception, M.D.   On: 02/03/2016 13:05   Dg Chest Port 1 View  Result Date: 01/29/2016 CLINICAL DATA:  Status post bronchoscopic biopsy. EXAM: PORTABLE CHEST 1 VIEW COMPARISON:  Chest x-ray and chest CT 01/27/2016 FINDINGS: Large right lower lobe lung mass is again demonstrated with right hilar adenopathy. No postprocedural pneumothorax is identified. The left lung remains clear. IMPRESSION: Stable large right lower lobe lung mass. No postprocedural pneumothorax. Electronically Signed   By: Marijo Sanes M.D.   On: 01/29/2016 12:26   Ct Angio Chest/abd/pel For Dissection W And/or W/wo  Result Date: 01/27/2016 CLINICAL DATA:  Intermittent LEFT lower extremity pain for 1 year and intermittent foot numbness. 30 pound weight loss in 2 months. Follow-up possible chest malignancy. EXAM: CT ANGIOGRAPHY CHEST, ABDOMEN AND PELVIS TECHNIQUE: Multidetector CT imaging through the chest, abdomen and pelvis was performed using the standard protocol during bolus administration of intravenous contrast. Multiplanar reconstructed images and MIPs were obtained and  reviewed to evaluate the vascular anatomy. CONTRAST:  80 cc Isovue 370 COMPARISON:  Chest radiograph January 27, 2016 at 1238 hours FINDINGS: CTA CHEST FINDINGS CARDIOVASCULAR: Thoracic aorta is normal course and caliber. No intrinsic density on noncontrast CT. Homogeneous contrast opacification of thoracic aorta without dissection, aneurysm, luminal irregularity, periaortic fluid collections, or contrast extravasation. Trace coronary artery calcifications. Heart size is normal. No pericardial effusion. MEDIASTINUM/NODES: 2.5 mm sub carinal nodal conglomeration. 12 mm RIGHT anterior mediastinal/ prevascular necrotic lymph node. RIGHT hilar lymphadenopathy contiguous with RIGHT lung mass. LUNGS/PLEURA: 9.1 x 13.2 x 13.9 cm RIGHT lung base mass contiguous with the RIGHT hilum, and is contiguous with the diaphragm and pleura. Effaced RIGHT lower lobe bronchi. RIGHT hilar lymphadenopathy narrows the RIGHT middle lobe bronchi. Mass encases the RIGHT lower lobe pulmonary arteries which are patent. No chest wall invasion. Patchy ground-glass nodules and opacities RIGHT lower lobe. LEFT lung base dependent atelectasis. Mild centrilobular emphysema. 3 mm RIGHT middle lobe pulmonary nodule versus granuloma. MUSCULOSKELETAL: Nonsuspicious. Sub cm LEFT thyroid nodule below size followup recommendations. Review of the MIP images confirms the above findings. CTA ABDOMEN AND PELVIS FINDINGS ARTERIES: Abdominal aorta is normal course and caliber. Mild calcific atherosclerosis. 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 Reconstitution of distal LEFT external iliac artery. Reconstitution of distal LEFT internal iliac arteries. Homogeneous contrast opacification of the aorta vessels without dissection, aneurysm, luminal irregularity, periaortic fluid collections, or contrast extravasation. Celiac axis, superior and inferior mesenteric arteries are patent. Moderate stenosis inferior  mesenteric artery origin. HEPATOBILIARY: Liver and gallbladder are normal. PANCREAS: Normal. SPLEEN: Normal. ADRENALS/URINARY TRACT: Kidneys are orthotopic, demonstrating symmetric enhancement. No nephrolithiasis, hydronephrosis or solid renal masses. 4.8 cm RIGHT interpolar benign-appearing cyst. The unopacified ureters are normal in course and caliber. Urinary bladder is well distended and unremarkable. Normal adrenal glands. STOMACH/BOWEL: The stomach, small and large bowel are normal in course and caliber without inflammatory changes decreased sensitivity without enteric contrast. Normal appendix. VASCULAR/LYMPHATIC: No lymphadenopathy by CT size criteria. REPRODUCTIVE: Normal. OTHER: No intraperitoneal free fluid or free air. MUSCULOSKELETAL: Nonacute. Bridging sacroiliac osteophytes. Nondisplaced RIGHT L3, L4 healing transverse process fractures.  Review of the MIP images confirms the above findings. IMPRESSION: CTA CHEST: 9 x 1 x 13.2 x 13.9 cm RIGHT lung mass contiguous with the hilum compatible with neoplasm, primary lung versus lymphoma. Mediastinal lymphadenopathy. RIGHT lower lobe probable postobstructive pneumonia. Small RIGHT pleural effusion. Moderate emphysema. No acute vascular process. 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 Reconstitution distal LEFT internal external iliac arteries. Mild atherosclerosis. Acute to subacute nondisplaced RIGHT L3 and L4 transverse process fractures appear traumatic, not pathologic. Acute findings discussed with and reconfirmed by Dr.WARREN JONES on 01/27/2016 at 5:45 pm. Electronically Signed   By: Elon Alas M.D.   On: 01/27/2016 17:50     LOS: 8 days   Oren Binet, MD  [facility] Pager:[phone]  If 7PM-7AM, please contact night-coverage [email or web address] Password [email or web address] 02/05/2016, 12:48 PM

## 2016-02-05 NOTE — Progress Notes (Signed)
Subjective: No new complaints   Antibiotics:  Anti-infectives    Start     Dose/Rate Route Frequency Ordered Stop   02/04/16 1800  piperacillin-tazobactam (ZOSYN) IVPB 3.375 g     3.375 g 12.5 mL/hr over 240 Minutes Intravenous Every 8 hours 02/04/16 1702     02/03/16 2000  vancomycin (VANCOCIN) IVPB 1000 mg/200 mL premix  Status:  Discontinued     1,000 mg 200 mL/hr over 60 Minutes Intravenous Every 8 hours 02/03/16 1048 02/03/16 1243   02/03/16 1100  vancomycin (VANCOCIN) IVPB 1000 mg/200 mL premix     1,000 mg 200 mL/hr over 60 Minutes Intravenous NOW 02/03/16 1048 02/03/16 1327   02/01/16 1800  vancomycin (VANCOCIN) 500 mg in sodium chloride 0.9 % 100 mL IVPB  Status:  Discontinued     500 mg 100 mL/hr over 60 Minutes Intravenous Every 8 hours 02/01/16 0901 02/03/16 1048   02/01/16 1000  piperacillin-tazobactam (ZOSYN) IVPB 3.375 g  Status:  Discontinued     3.375 g 12.5 mL/hr over 240 Minutes Intravenous Every 8 hours 02/01/16 0901 02/04/16 1314   02/01/16 0900  vancomycin (VANCOCIN) IVPB 1000 mg/200 mL premix     1,000 mg 200 mL/hr over 60 Minutes Intravenous  Once 02/01/16 0901 02/01/16 1101   01/31/16 0900  ampicillin-sulbactam (UNASYN) 1.5 g in sodium chloride 0.9 % 50 mL IVPB  Status:  Discontinued     1.5 g 100 mL/hr over 30 Minutes Intravenous Every 6 hours 01/31/16 0756 02/01/16 0758      Medications: Scheduled Meds: . enoxaparin (LOVENOX) injection  40 mg Subcutaneous Q24H  . feeding supplement (ENSURE ENLIVE)  237 mL Oral BID BM  . folic acid  1 mg Oral Daily  . multivitamin with minerals  1 tablet Oral Daily  . nicotine  14 mg Transdermal QHS  . piperacillin-tazobactam (ZOSYN)  IV  3.375 g Intravenous Q8H  . thiamine  100 mg Oral Daily   Or  . thiamine  100 mg Intravenous Daily   Continuous Infusions: . sodium chloride 10 mL (01/29/16 1015)   PRN Meds:.acetaminophen **OR** acetaminophen, albuterol, ipratropium-albuterol, ondansetron **OR**  ondansetron (ZOFRAN) IV    Objective: Weight change:   Intake/Output Summary (Last 24 hours) at 02/05/16 1633 Last data filed at 02/05/16 1608  Gross per 24 hour  Intake          1848.17 ml  Output             2725 ml  Net          -876.83 ml   Blood pressure 113/65, pulse 98, temperature 99.1 F (37.3 C), temperature source Oral, resp. rate 20, height '5\' 6"'$  (1.676 m), weight 136 lb 8 oz (61.9 kg), SpO2 98 %. Temp:  [98.8 F (37.1 C)-101.5 F (38.6 C)] 99.1 F (37.3 C) (11/24 1617) Pulse Rate:  [87-98] 98 (11/24 1617) Resp:  [18-20] 20 (11/24 1617) BP: (105-113)/(61-65) 113/65 (11/24 1617) SpO2:  [98 %-99 %] 98 % (11/24 1617) Weight:  [136 lb 8 oz (61.9 kg)] 136 lb 8 oz (61.9 kg) (11/24 0526)  Physical Exam: General: Alert and awake, oriented x3, not in any acute distress, underweight HEENT: anicteric sclera,  EOMI, oropharynx clear and without exudate, he is missing all his teeth on the top he only has a few on the bottom that are present Cardiovascular: regular rate, normal r,  no murmur rubs or gallops Pulmonary: Fairly clear to auscultation bilaterally, no wheezing, rales  or rhonchi Gastrointestinal: soft nontender, nondistended, normal bowel sounds,  Skin, soft tissue:    On back where he had cyst removed number 23rd 2017:     Lesions on left shin 02/04/2016:       Aeration on right elbow where he fell 02/04/2016:      Neuro: nonfocal, strength and sensation intact    CBC:  CBC Latest Ref Rng & Units 02/04/2016 02/03/2016 02/02/2016  WBC 4.0 - 10.5 K/uL 18.5(H) 17.6(H) 15.3(H)  Hemoglobin 13.0 - 17.0 g/dL 7.6(L) 8.3(L) 7.6(L)  Hematocrit 39.0 - 52.0 % 25.0(L) 26.6(L) 24.8(L)  Platelets 150 - 400 K/uL 651(H) 703(H) 608(H)     BMET  Recent Labs  02/03/16 0914  NA 134*  K 3.7  CL 102  CO2 24  GLUCOSE 108*  BUN 9  CREATININE 0.60*  CALCIUM 8.6*     Liver Panel  No results for input(s): PROT, ALBUMIN, AST, ALT,  ALKPHOS, BILITOT, BILIDIR, IBILI in the last 72 hours.     Sedimentation Rate  Recent Labs  02/05/16 0645  ESRSEDRATE 140*   C-Reactive Protein  Recent Labs  02/05/16 0645  CRP 19.1*    Micro Results: Recent Results (from the past 720 hour(s))  Culture, blood (routine x 2)     Status: None   Collection Time: 01/30/16  9:00 AM  Result Value Ref Range Status   Specimen Description BLOOD RIGHT ANTECUBITAL  Final   Special Requests BOTTLES DRAWN AEROBIC AND ANAEROBIC 10CC  Final   Culture NO GROWTH 5 DAYS  Final   Report Status 02/04/2016 FINAL  Final  Culture, blood (routine x 2)     Status: None   Collection Time: 01/30/16  9:15 AM  Result Value Ref Range Status   Specimen Description BLOOD RIGHT ANTECUBITAL  Final   Special Requests BOTTLES DRAWN AEROBIC ONLY [place]  Final   Culture NO GROWTH 5 DAYS  Final   Report Status 02/04/2016 FINAL  Final  Culture, blood (routine x 2)     Status: None (Preliminary result)   Collection Time: 02/03/16  3:35 PM  Result Value Ref Range Status   Specimen Description BLOOD RIGHT ARM  Final   Special Requests BOTTLES DRAWN AEROBIC ONLY 10CC  Final   Culture NO GROWTH 2 DAYS  Final   Report Status PENDING  Incomplete  Culture, blood (routine x 2)     Status: None (Preliminary result)   Collection Time: 02/03/16  3:40 PM  Result Value Ref Range Status   Specimen Description BLOOD RIGHT HAND  Final   Special Requests BOTTLES DRAWN AEROBIC ONLY 10CC  Final   Culture NO GROWTH 2 DAYS  Final   Report Status PENDING  Incomplete    Studies/Results: Dg Orthopantogram  Result Date: 02/04/2016 CLINICAL DATA:  Fever of unknown origin EXAM: ORTHOPANTOGRAM/PANORAMIC COMPARISON:  None. FINDINGS: The patient is missing all of his maxillary teeth and many of his mandibular teeth. The single remaining tooth in the right mandible demonstrates mild periapical lucency. No other abnormalities. IMPRESSION: There is suggested minimal periapical lucency  adjacent to the root of the single remaining right mandibular tooth. Electronically Signed   By: Dorise Bullion III M.D   On: 02/04/2016 17:48   Dg Chest 2 View  Result Date: 02/05/2016 CLINICAL DATA:  Fever EXAM: CHEST  2 VIEW COMPARISON:  February 03, 2016 chest radiograph and chest CT January 27, 2016 FINDINGS: There is a persistent large mass occupying much of the right lower lobe with probable  surrounding pneumonitis. Lungs elsewhere clear. Heart size and pulmonary vascularity are normal. No adenopathy. Adenopathy is noted in the sub- carinal and right hilar regions, better appreciated on CT. No evident bone lesions. IMPRESSION: Large mass right lower lobe with probable surrounding pneumonitis. No new opacity. Sub- carinal and right hilar adenopathy, better seen by CT. Electronically Signed   By: Lowella Grip III M.D.   On: 02/05/2016 10:04   Ct Chest W Contrast  Result Date: 02/05/2016 CLINICAL DATA:  55 year old male inpatient with large right lower lobe lung mass detected on recent chest CT angiogram. Bronchoscopic biopsy on 01/29/2016 demonstrated non-small cell lung carcinoma, favor adenocarcinoma. Persistent fever. EXAM: CT CHEST WITH CONTRAST TECHNIQUE: Multidetector CT imaging of the chest was performed during intravenous contrast administration. CONTRAST:  75 cc ISOVUE-300 IOPAMIDOL (ISOVUE-300) INJECTION 61% COMPARISON:  01/27/2016 chest CT angiogram. Chest radiograph from earlier today. FINDINGS: Cardiovascular: Normal heart size. Small to moderate pericardial effusion/ thickening is not appreciably changed. Left anterior descending and right coronary atherosclerosis. Atherosclerotic nonaneurysmal thoracic aorta. Normal caliber pulmonary arteries. No central pulmonary emboli. Mediastinum/Nodes: Stable hypodense 0.6 cm left thyroid lobe nodule. Unremarkable esophagus. No axillary adenopathy. Stable mildly enlarged 1.1 cm right paratracheal node (series 2/ image 27). Stable moderately  enlarged 2.4 cm subcarinal node (series 2/ image 34), heterogeneously enhancing. Stable mildly enlarged 1.2 cm prevascular right anterior mediastinal lymph node (series 2/ image 31). Stable mildly enlarged 1.2 cm right lower paraesophageal node (series 6/ image 135). Stable right hilar adenopathy measuring up to 2.0 cm (series 2/ image 32). No left hilar adenopathy. Lungs/Pleura: No pneumothorax. Stable small right pleural effusion. Stable irregular pleural thickening along the superior right major fissure (series 7/ image 62). No left pleural effusion. Mild centrilobular emphysema. Large 14.2 x 10.0 cm solid heterogeneously enhancing right lower lobe lung mass (series 7/ image 115), which is confluent with the right hilum, and is mildly increased from 13.3 x 9.6 cm on 01/27/2016 using similar measurement technique. There is narrowing/occlusion of several right lower lobe segmental bronchi by the mass. There is extensive patchy consolidation and ground-glass attenuation throughout the periphery of the right lower lobe, slightly worsened. Two tiny right middle lobe pulmonary nodules, largest 4 mm (series 7/image 101), slightly increased from 3 mm on 01/27/2016. Left upper lobe 5 mm solid pulmonary nodule (series 7/image 45), slightly increased from 4 mm on 01/27/2016. Upper abdomen: Unremarkable. Musculoskeletal: No aggressive appearing focal osseous lesions. Mild thoracic spondylosis. IMPRESSION: 1. Large 14.2 cm right lower lobe lung mass, consistent with biopsy-proven primary bronchogenic carcinoma, mildly increased in size since 01/27/2016. 2. Extensive patchy consolidation and ground-glass attenuation throughout the periphery of the right lower lobe, slightly worsened, consistent with postobstructive pneumonia. 3. Small right pleural effusion, stable. Stable irregular pleural thickening along the superior right major fissure, cannot exclude pleural spread of tumor. 4. Three scattered bilateral pulmonary nodules  located in the right middle and left upper lobes, all slightly increased in size since 01/27/2016, suspicious for pulmonary metastases. 5. Stable right hilar adenopathy. Stable right paratracheal, prevascular right anterior mediastinal, subcarinal and right lower paraesophageal adenopathy. PET-CT can be considered for further characterization. 6. Stable small to moderate pericardial effusion/thickening. 7. Aortic atherosclerosis.  Two-vessel coronary atherosclerosis. 8. Mild centrilobular emphysema. Electronically Signed   By: Ilona Sorrel M.D.   On: 02/05/2016 14:42      Assessment/Plan:  INTERVAL HISTORY: Ration persistently febrile repeat CT scan of the lungs done those some radiographic worsening of postobstructive pneumonia   Principal Problem:  Mass of right lung Active Problems:   Leg pain, inferior, left   Microcytic hypochromic anemia   Lung mass   Protein-calorie malnutrition, severe   Postobstructive pneumonia   PVD (peripheral vascular disease) (HCC)   Lumbar transverse process fracture (HCC)   Tobacco abuse   Alcohol abuse   FUO (fever of unknown origin)   S/P bronchoscopy with biopsy   Malignant neoplasm of upper lobe of right lung (HCC)    [name] Chapman is a 55 y.o. male with  Recently diagnosed lung cancer on bronchoscopy also being treated for postobstructive pneumonia was had persistent fevers over 101.  #1 Fever of unknown origin: No clear-cut source beyond his postobstructive pneumonia which other than his fevers has seemed to improve.  Continued him on Zosyn and we repeated his CT scan of the lungs that shows "worsening on CT of the postobstructive pneumonia. His cancer seems to be progressing as well. Dr Sloan Leiter 100 out that his malignancy has a necrotic component to it.  I'm skeptical that he is really failing antibiotics at this point in time with multidrug-resistant organisms.  I think the tumor progressing is what is causing the fever rather than  multidrug-resistant organisms.  I would myself favor changing him over to Augmentin to complete a 14 day course of antibiotics to cover postobstructive pathogens and going forward with initiation of chemotherapy.   would recommend touching base with pulmonary care first prior to doing this    LOS: 8 days   Alcide Evener 02/05/2016, 4:33 PM

## 2016-02-05 NOTE — Progress Notes (Signed)
   02/05/16 0009  Vitals  Temp (!) 101.5 F (38.6 C) (Tylenol given)  Temp Source Oral  BP 105/61  MAP (mmHg) 72  BP Location Right Arm  BP Method Automatic  Patient Position (if appropriate) Lying  Pulse Rate 95  Pulse Rate Source Monitor  Resp 18  Oxygen Therapy  SpO2 98 %  O2 Device Room Air

## 2016-02-06 LAB — HEPATITIS PANEL, ACUTE
HEP A IGM: NEGATIVE
HEP B S AG: NEGATIVE
Hep B C IgM: NEGATIVE

## 2016-02-06 LAB — RHEUMATOID FACTOR: Rhuematoid fact SerPl-aCnc: 74.6 IU/mL — ABNORMAL HIGH (ref 0.0–13.9)

## 2016-02-06 LAB — CMV ANTIBODY, IGG (EIA): CMV Ab - IgG: <0.6 U/mL (ref 0.00–0.59)

## 2016-02-06 LAB — CMV IGM

## 2016-02-06 MED ORDER — ASPIRIN EC 81 MG PO TBEC: 81.0000 mg | DELAYED_RELEASE_TABLET | Freq: Every day | ORAL | 0 refills | Status: DC

## 2016-02-06 MED ORDER — NICOTINE 14 MG/24HR TD PT24: 14.0000 mg | MEDICATED_PATCH | Freq: Every day | TRANSDERMAL | 0 refills | Status: DC

## 2016-02-06 MED ORDER — ADULT MULTIVITAMIN W/MINERALS CH: 1.0000 | ORAL_TABLET | Freq: Every day | ORAL | 0 refills | Status: DC

## 2016-02-06 MED ORDER — AMOXICILLIN-POT CLAVULANATE 875-125 MG PO TABS: 1.0000 | ORAL_TABLET | Freq: Two times a day (BID) | ORAL | 0 refills | Status: DC

## 2016-02-06 MED ORDER — FAMOTIDINE 20 MG PO TABS: 20.0000 mg | ORAL_TABLET | Freq: Every day | ORAL | 0 refills | Status: DC

## 2016-02-06 MED ORDER — THIAMINE HCL 100 MG PO TABS: 100.0000 mg | ORAL_TABLET | Freq: Every day | ORAL | 0 refills | Status: DC

## 2016-02-06 MED ORDER — NAPROXEN 500 MG PO TABS: 500.0000 mg | ORAL_TABLET | Freq: Two times a day (BID) | ORAL | 0 refills | Status: DC | PRN

## 2016-02-06 MED ORDER — ENSURE ENLIVE PO LIQD: 237.0000 mL | Freq: Two times a day (BID) | ORAL | 0 refills | Status: DC

## 2016-02-06 NOTE — Discharge Summary (Addendum)
PATIENT DETAILS Name: [name] Chapman Age: 55 y.o. Sex: male Date of Birth: 1960/11/20 MRN: [id number]. Admitting Physician: Rise Patience, MD PCP:No PCP Per Patient  Admit Date: 01/27/2016 Discharge date: 02/06/2016  Recommendations for Outpatient Follow-up:  1. Follow up oncology in 1 week  2. Please obtain BMP/CBC in one week 3. Ensure follow-up with vascular surgery  Admitted From:  Home  Disposition: [place]: No  Equipment/Devices: None  Discharge Condition: Stable  CODE STATUS: FULL CODE  Diet recommendation:   Regular   Brief Summary: See H&P, Labs, Consult and Test reports for all details in brief, Patient is a 55 y.o. malewith long-standing history of tobacco use, presented to the hospital for evaluation of claudication pain involving his left lower extremity. This was evaluated by a CT angiogram of the chest, abdomen/pelvis which demonstrated a large right lung mass. 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 Hospital course complicated by persistent fever felt to be secondary to tumor fever. See below for further details   Brief Hospital Course: Right lower lobe lung mass: Long-standing history of tobacco use. 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 MRI brain completed which was negative. Will need outpatient PET/CT. Oncology follow up with Dr. Irene Limbo in 1 week-spoke with Dr. Irene Limbo on the day of discharge-his office will arrange for outpatient follow-up next week.   Fever: Hospital course complicated by persistent fever in spite of being on broad-spectrum antibiotics. Blood cultures on 11/18 and 11/22 negative. Bilateral lower extremity Dopplers negative. CT scan of the abdomen on admission did not show any obvious foci. Infectious disease  was consulted, patient underwent a repeat CT scan on 11/24 which shows continued infiltrates thought to be postobstructive pneumonia. Initial radiology note-on admission, mentions significant necrotic tissues in the periphery of the mass. Current working diagnoses is that the fever is most likely tumor fever and not a failure of antimicrobial therapy. After discussion with infectious disease, oncology, recommendations are to discharge home on  Augmentin to complete a total of 2 weeks of antimicrobial therapy. Oncology will arrange for a quick outpatient follow-up at the office sometime next week. Have instructed spouse to monitor patient closely, use NSAIDs as needed for fever.  Peripheralvascular disease with left leg claudication pain:Patient has had claudication involving his left lower extremity for approximately 1 year, he can now only walk approximately 100 feet before he starts having pain. 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 Vascular surgery consulted-likely chronic occulusion, recommendations are to percutaneous revascularization once medical issues stable. Spoke with vascular surgery Silva Bandy PA-C on 11/23-recommendations are to pursue further investigations in the outpatient setting. As a appointment with vascular surgery on 12/15. Continue aspirin on discharge.  Right L3/L4 transverse process fracture: Supportive care. No significant trauma history.   Normocytic anemia:No history of obvious blood loss. High suspicion for anemia due to malignancy. 1u pRBC transfused 11/16. Hemoglobin fluctuating but has been persistently more than 7. Continue to follow CBC in the outpatient setting.  Thrombocytosis:Likely secondary to acute illness and likely malignancy as above, trend CBC   Tobacco abuse: Counseled  EtOH use: Drinks approximately 40-80 ounces of beer daily. Last drink on  11/15. No signs of withdrawal.  Severe protein calorie malnutrition: Continue supplements  Procedures/Studies Bronchoscopy 11/17  Discharge Diagnoses:  Principal Problem:   Mass of right lung Active Problems:   Leg pain, inferior, left   Microcytic hypochromic anemia   Lung mass   Protein-calorie malnutrition, severe   Postobstructive pneumonia   PVD (peripheral vascular disease) (HCC)   Lumbar transverse process fracture (HCC)   Tobacco abuse   Alcohol abuse   FUO (fever of unknown origin)   S/P bronchoscopy with biopsy   Malignant neoplasm of upper lobe of right lung [facility])   Discharge Instructions:  Activity:  As tolerated with Full fall precautions use walker/cane & assistance as needed   Discharge Instructions    Call MD for:  difficulty breathing, headache or visual disturbances    Complete by:  As directed    Call MD for:  persistant nausea and vomiting    Complete by:  As directed    Call MD for:  severe uncontrolled pain    Complete by:  As directed    Call MD for:  temperature >100.4    Complete by:  As directed    Diet general    Complete by:  As directed    Discharge instructions    Complete by:  As directed    Follow with Dr Irene Limbo (Oncology)-his office will get in touch with you for a follow up appointment-if you do not hear from them in the next few days-call [phone]  Follow with Dr Trula Slade (Vascular surgery)-you have a appointment on 12/15 at 11:15 am  Please get a complete blood count and chemistry panel checked by your Primary MD at your next visit, and again as instructed by your Primary MD.  Get Medicines reviewed and adjusted: Please take all your medications with you for your next visit with your Primary MD  Laboratory/radiological data: Please request your Primary MD to go over all hospital tests and procedure/radiological results at the follow up, please ask your Primary MD to get all Hospital records sent to his/her office.  In some  cases, they will be blood work, cultures and biopsy results pending at the time of your discharge. Please request that your primary care M.D. follows up on these results.  Also Note the following: If you experience worsening of your admission symptoms, develop shortness of breath, life threatening emergency, suicidal or homicidal thoughts you must seek medical attention immediately by calling 911 or calling your MD immediately  if symptoms less severe.  You must read complete instructions/literature along with all the possible adverse reactions/side effects for all the Medicines you take and that have been prescribed to you. Take any new Medicines after you have completely understood and accpet all the possible adverse reactions/side effects.   Do not drive when taking Pain medications or sleeping medications (Benzodaizepines)  Do not take more than prescribed Pain, Sleep and Anxiety Medications. It is not advisable to combine anxiety,sleep and pain medications without talking with your primary care practitioner  Special Instructions: If you have smoked or chewed Tobacco  in the last 2 yrs please stop smoking, stop any regular Alcohol  and or any Recreational drug use.  Wear Seat belts while driving.  Please note: You were cared for by a hospitalist during your hospital stay. Once you are discharged, your primary care physician will handle any further medical issues. Please note that NO REFILLS for any discharge medications will be authorized once you are discharged, as it is imperative that you return to your primary care physician (or establish a relationship with a primary care  physician if you do not have one) for your post hospital discharge needs so that they can reassess your need for medications and monitor your lab values.   Increase activity slowly    Complete by:  As directed        Medication List    STOP taking these medications   acetaminophen 500 MG tablet Commonly known as:   TYLENOL   BC HEADACHE POWDER PO   cyclobenzaprine 10 MG tablet Commonly known as:  FLEXERIL   diclofenac 50 MG tablet Commonly known as:  CATAFLAM     TAKE these medications   amoxicillin-clavulanate 875-125 MG tablet Commonly known as:  AUGMENTIN Take 1 tablet by mouth 2 (two) times daily.   aspirin EC 81 MG tablet Take 1 tablet (81 mg total) by mouth daily.   famotidine 20 MG tablet Commonly known as:  PEPCID Take 1 tablet (20 mg total) by mouth daily.   feeding supplement (ENSURE ENLIVE) Liqd Take 237 mLs by mouth 2 (two) times daily between meals.   multivitamin with minerals Tabs tablet Take 1 tablet by mouth daily. Start taking on:  02/07/2016   naproxen 500 MG tablet Commonly known as:  NAPROSYN Take 1 tablet (500 mg total) by mouth 2 (two) times daily as needed (FEVER >101).   nicotine 14 mg/24hr patch Commonly known as:  NICODERM CQ - dosed in mg/24 hours Place 1 patch (14 mg total) onto the skin at bedtime.   thiamine 100 MG tablet Take 1 tablet (100 mg total) by mouth daily. Start taking on:  02/07/2016      Follow-up Hardy, MD. Call in 1 week(s).   Specialties:  Hematology, Oncology Why:  office will call you with a follow up appointment, if you do not hear from them in the next few days, please call and make  a appointment Contact information: [place] [address] [phone]        [name], MD Follow up on 02/26/2016.   Specialties:  Vascular Surgery, Cardiology Why:  appointment at 11:15 am Contact information: [place] [address] [phone]          No Known Allergies  Consultations:   pulmonary/intensive care, ID and vascular surgery   Other Procedures/Studies: Dg Orthopantogram  Result Date: 02/04/2016 CLINICAL DATA:  Fever of unknown origin EXAM: ORTHOPANTOGRAM/PANORAMIC COMPARISON:  None. FINDINGS: The patient is missing all of his maxillary teeth and many of  his mandibular teeth. The single remaining tooth in the right mandible demonstrates mild periapical lucency. No other abnormalities. IMPRESSION: There is suggested minimal periapical lucency adjacent to the root of the single remaining right mandibular tooth. Electronically Signed   By: Dorise Bullion III M.D   On: 02/04/2016 17:48   Dg Chest 2 View  Result Date: 02/05/2016 CLINICAL DATA:  Fever EXAM: CHEST  2 VIEW COMPARISON:  February 03, 2016 chest radiograph and chest CT January 27, 2016 FINDINGS: There is a persistent large mass occupying much of the right lower lobe with probable surrounding pneumonitis. Lungs elsewhere clear. Heart size and pulmonary vascularity are normal. No adenopathy. Adenopathy is noted in the sub- carinal and right hilar regions, better appreciated on CT. No evident bone lesions. IMPRESSION: Large mass right lower lobe with probable surrounding pneumonitis. No new opacity. Sub- carinal and right hilar adenopathy, better seen by CT. Electronically Signed   By: Lowella Grip III M.D.   On: 02/05/2016 10:04   Dg  Chest 2 View  Result Date: 01/27/2016 CLINICAL DATA:  Intermittent left lower extremity pain after the last year. 30 pound weight loss over the last 2 months. EXAM: CHEST  2 VIEW COMPARISON:  Chest CT 06/16/2007. FINDINGS: Heart size is normal. Mediastinal shadows are normal. Left lung is clear. There is consolidation and collapse in the right lower lobe. Right upper lobe is clear. Some volume loss in the right middle lobe. There is some pleural density on the right laterally. IMPRESSION: Consolidation and volume loss in the right lower lobe. Mild volume loss in the right middle lobe. Pleural density on the right laterally. Findings could be due to chronic pneumonia with volume loss, but the possibility of malignant disease does exist. Chest CT with contrast suggested when able. Electronically Signed   By: Nelson Chimes M.D.   On: 01/27/2016 12:59   Ct Chest W  Contrast  Result Date: 02/05/2016 CLINICAL DATA:  55 year old male inpatient with large right lower lobe lung mass detected on recent chest CT angiogram. Bronchoscopic biopsy on 01/29/2016 demonstrated non-small cell lung carcinoma, favor adenocarcinoma. Persistent fever. EXAM: CT CHEST WITH CONTRAST TECHNIQUE: Multidetector CT imaging of the chest was performed during intravenous contrast administration. CONTRAST:  75 cc ISOVUE-300 IOPAMIDOL (ISOVUE-300) INJECTION 61% COMPARISON:  01/27/2016 chest CT angiogram. Chest radiograph from earlier today. FINDINGS: Cardiovascular: Normal heart size. Small to moderate pericardial effusion/ thickening is not appreciably changed. Left anterior descending and right coronary atherosclerosis. Atherosclerotic nonaneurysmal thoracic aorta. Normal caliber pulmonary arteries. No central pulmonary emboli. Mediastinum/Nodes: Stable hypodense 0.6 cm left thyroid lobe nodule. Unremarkable esophagus. No axillary adenopathy. Stable mildly enlarged 1.1 cm right paratracheal node (series 2/ image 27). Stable moderately enlarged 2.4 cm subcarinal node (series 2/ image 34), heterogeneously enhancing. Stable mildly enlarged 1.2 cm prevascular right anterior mediastinal lymph node (series 2/ image 31). Stable mildly enlarged 1.2 cm right lower paraesophageal node (series 6/ image 135). Stable right hilar adenopathy measuring up to 2.0 cm (series 2/ image 32). No left hilar adenopathy. Lungs/Pleura: No pneumothorax. Stable small right pleural effusion. Stable irregular pleural thickening along the superior right major fissure (series 7/ image 62). No left pleural effusion. Mild centrilobular emphysema. Large 14.2 x 10.0 cm solid heterogeneously enhancing right lower lobe lung mass (series 7/ image 115), which is confluent with the right hilum, and is mildly increased from 13.3 x 9.6 cm on 01/27/2016 using similar measurement technique. There is narrowing/occlusion of several right lower lobe  segmental bronchi by the mass. There is extensive patchy consolidation and ground-glass attenuation throughout the periphery of the right lower lobe, slightly worsened. Two tiny right middle lobe pulmonary nodules, largest 4 mm (series 7/image 101), slightly increased from 3 mm on 01/27/2016. Left upper lobe 5 mm solid pulmonary nodule (series 7/image 45), slightly increased from 4 mm on 01/27/2016. Upper abdomen: Unremarkable. Musculoskeletal: No aggressive appearing focal osseous lesions. Mild thoracic spondylosis. IMPRESSION: 1. Large 14.2 cm right lower lobe lung mass, consistent with biopsy-proven primary bronchogenic carcinoma, mildly increased in size since 01/27/2016. 2. Extensive patchy consolidation and ground-glass attenuation throughout the periphery of the right lower lobe, slightly worsened, consistent with postobstructive pneumonia. 3. Small right pleural effusion, stable. Stable irregular pleural thickening along the superior right major fissure, cannot exclude pleural spread of tumor. 4. Three scattered bilateral pulmonary nodules located in the right middle and left upper lobes, all slightly increased in size since 01/27/2016, suspicious for pulmonary metastases. 5. Stable right hilar adenopathy. Stable right paratracheal, prevascular right anterior mediastinal, subcarinal  and right lower paraesophageal adenopathy. PET-CT can be considered for further characterization. 6. Stable small to moderate pericardial effusion/thickening. 7. Aortic atherosclerosis.  Two-vessel coronary atherosclerosis. 8. Mild centrilobular emphysema. Electronically Signed   By: Ilona Sorrel M.D.   On: 02/05/2016 14:42   Mr Jeri Cos VO Contrast  Result Date: 01/30/2016 CLINICAL DATA:  Newly diagnosed lung cancer. Evaluation for intracranial metastatic disease. EXAM: MRI HEAD WITHOUT AND WITH CONTRAST TECHNIQUE: Multiplanar, multiecho pulse sequences of the brain and surrounding structures were obtained without and with  intravenous contrast. CONTRAST:  64m MULTIHANCE GADOBENATE DIMEGLUMINE 529 MG/ML IV SOLN COMPARISON:  Head CT 06/16/2007 FINDINGS: Brain: No acute infarct or intraparenchymal hemorrhage. The midline structures are normal. No focal parenchymal signal abnormality. No mass lesion or midline shift. No hydrocephalus or extra-axial fluid collection. No contrast-enhancing lesions. Vascular: Major intracranial arterial and venous sinus flow voids are preserved. No evidence of chronic microhemorrhage or amyloid angiopathy. Skull and upper cervical spine: The visualized skull base, calvarium, upper cervical spine and extracranial soft tissues are normal. Sinuses/Orbits: Complete opacification of the left maxillary sinus. Small amount of bilateral mastoid fluid. Normal orbits. IMPRESSION: No intracranial metastatic disease. Electronically Signed   By: KUlyses JarredM.D.   On: 01/30/2016 00:40   Dg Chest Port 1 View  Result Date: 02/03/2016 CLINICAL DATA:  Chest mass. EXAM: PORTABLE CHEST 1 VIEW COMPARISON:  Radiograph of January 29, 2016. CT scan of January 27, 2016. FINDINGS: The heart size and mediastinal contours are within normal limits. Left lung is clear. No pneumothorax is noted. Stable appearance of large right lower lobe lung mass is noted with associated minimal pleural effusion. The visualized skeletal structures are unremarkable. IMPRESSION: Stable appearance of large right lung mass is noted as described on prior exams. Electronically Signed   By: JMarijo Conception M.D.   On: 02/03/2016 13:05   Dg Chest Port 1 View  Result Date: 01/29/2016 CLINICAL DATA:  Status post bronchoscopic biopsy. EXAM: PORTABLE CHEST 1 VIEW COMPARISON:  Chest x-ray and chest CT 01/27/2016 FINDINGS: Large right lower lobe lung mass is again demonstrated with right hilar adenopathy. No postprocedural pneumothorax is identified. The left lung remains clear. IMPRESSION: Stable large right lower lobe lung mass. No postprocedural  pneumothorax. Electronically Signed   By: PMarijo SanesM.D.   On: 01/29/2016 12:26   Ct Angio Chest/abd/pel For Dissection W And/or W/wo  Result Date: 01/27/2016 CLINICAL DATA:  Intermittent LEFT lower extremity pain for 1 year and intermittent foot numbness. 30 pound weight loss in 2 months. Follow-up possible chest malignancy. EXAM: CT ANGIOGRAPHY CHEST, ABDOMEN AND PELVIS TECHNIQUE: Multidetector CT imaging through the chest, abdomen and pelvis was performed using the standard protocol during bolus administration of intravenous contrast. Multiplanar reconstructed images and MIPs were obtained and reviewed to evaluate the vascular anatomy. CONTRAST:  80 cc Isovue 370 COMPARISON:  Chest radiograph January 27, 2016 at 1238 hours FINDINGS: CTA CHEST FINDINGS CARDIOVASCULAR: Thoracic aorta is normal course and caliber. No intrinsic density on noncontrast CT. Homogeneous contrast opacification of thoracic aorta without dissection, aneurysm, luminal irregularity, periaortic fluid collections, or contrast extravasation. Trace coronary artery calcifications. Heart size is normal. No pericardial effusion. MEDIASTINUM/NODES: 2.5 mm sub carinal nodal conglomeration. 12 mm RIGHT anterior mediastinal/ prevascular necrotic lymph node. RIGHT hilar lymphadenopathy contiguous with RIGHT lung mass. LUNGS/PLEURA: 9.1 x 13.2 x 13.9 cm RIGHT lung base mass contiguous with the RIGHT hilum, and is contiguous with the diaphragm and pleura. Effaced RIGHT lower lobe bronchi. RIGHT hilar  lymphadenopathy narrows the RIGHT middle lobe bronchi. Mass encases the RIGHT lower lobe pulmonary arteries which are patent. No chest wall invasion. Patchy ground-glass nodules and opacities RIGHT lower lobe. LEFT lung base dependent atelectasis. Mild centrilobular emphysema. 3 mm RIGHT middle lobe pulmonary nodule versus granuloma. MUSCULOSKELETAL: Nonsuspicious. Sub cm LEFT thyroid nodule below size followup recommendations. Review of the MIP  images confirms the above findings. CTA ABDOMEN AND PELVIS FINDINGS ARTERIES: Abdominal aorta is normal course and caliber. Mild calcific atherosclerosis. 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 Reconstitution of distal LEFT external iliac artery. Reconstitution of distal LEFT internal iliac arteries. Homogeneous contrast opacification of the aorta vessels without dissection, aneurysm, luminal irregularity, periaortic fluid collections, or contrast extravasation. Celiac axis, superior and inferior mesenteric arteries are patent. Moderate stenosis inferior mesenteric artery origin. HEPATOBILIARY: Liver and gallbladder are normal. PANCREAS: Normal. SPLEEN: Normal. ADRENALS/URINARY TRACT: Kidneys are orthotopic, demonstrating symmetric enhancement. No nephrolithiasis, hydronephrosis or solid renal masses. 4.8 cm RIGHT interpolar benign-appearing cyst. The unopacified ureters are normal in course and caliber. Urinary bladder is well distended and unremarkable. Normal adrenal glands. STOMACH/BOWEL: The stomach, small and large bowel are normal in course and caliber without inflammatory changes decreased sensitivity without enteric contrast. Normal appendix. VASCULAR/LYMPHATIC: No lymphadenopathy by CT size criteria. REPRODUCTIVE: Normal. OTHER: No intraperitoneal free fluid or free air. MUSCULOSKELETAL: Nonacute. Bridging sacroiliac osteophytes. Nondisplaced RIGHT L3, L4 healing transverse process fractures. Review of the MIP images confirms the above findings. IMPRESSION: CTA CHEST: 9 x 1 x 13.2 x 13.9 cm RIGHT lung mass contiguous with the hilum compatible with neoplasm, primary lung versus lymphoma. Mediastinal lymphadenopathy. RIGHT lower lobe probable postobstructive pneumonia. Small RIGHT pleural effusion. Moderate emphysema. No acute vascular process. 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 Reconstitution distal LEFT  internal external iliac arteries. Mild atherosclerosis. Acute to subacute nondisplaced RIGHT L3 and L4 transverse process fractures appear traumatic, not pathologic. Acute findings discussed with and reconfirmed by Dr.WARREN JONES on 01/27/2016 at 5:45 pm. Electronically Signed   By: Elon Alas M.D.   On: 01/27/2016 17:50     TODAY-DAY OF DISCHARGE:  Subjective:   Raden [place] today has no headache,no chest abdominal pain,no new weakness tingling or numbness, feels much better wants to go home today.   Objective:   Blood pressure 102/61, pulse 92, temperature (!) 100.4 F (38 C), temperature source Oral, resp. rate 16, height '5\' 6"'$  (1.676 m), weight 61.9 kg (136 lb 8 oz), SpO2 100 %.  Intake/Output Summary (Last 24 hours) at 02/06/16 1433 Last data filed at 02/06/16 1207  Gross per 24 hour  Intake             1336 ml  Output             3880 ml  Net            -2544 ml   Filed Weights   01/27/16 2155 01/29/16 1005 02/05/16 0526  Weight: 57.3 kg (126 lb 5.2 oz) 57.3 kg (126 lb 5.2 oz) 61.9 kg (136 lb 8 oz)    Exam: Awake Alert, Oriented *3, No new F.N deficits, Normal affect [place].AT,PERRAL Supple Neck,No JVD, No cervical lymphadenopathy appriciated.  Symmetrical Chest wall movement, Good air movement bilaterally, CTAB RRR,No Gallops,Rubs or new Murmurs, No Parasternal Heave +ve B.Sounds, Abd Soft, Non tender, No organomegaly appriciated, No rebound -guarding or rigidity. No Cyanosis, Clubbing or edema, No new Rash or bruise   PERTINENT  RADIOLOGIC STUDIES: Dg Orthopantogram  Result Date: 02/04/2016 CLINICAL DATA:  Fever of unknown origin EXAM: ORTHOPANTOGRAM/PANORAMIC COMPARISON:  None. FINDINGS: The patient is missing all of his maxillary teeth and many of his mandibular teeth. The single remaining tooth in the right mandible demonstrates mild periapical lucency. No other abnormalities. IMPRESSION: There is suggested minimal periapical lucency adjacent to the root of the  single remaining right mandibular tooth. Electronically Signed   By: Dorise Bullion III M.D   On: 02/04/2016 17:48   Dg Chest 2 View  Result Date: 02/05/2016 CLINICAL DATA:  Fever EXAM: CHEST  2 VIEW COMPARISON:  February 03, 2016 chest radiograph and chest CT January 27, 2016 FINDINGS: There is a persistent large mass occupying much of the right lower lobe with probable surrounding pneumonitis. Lungs elsewhere clear. Heart size and pulmonary vascularity are normal. No adenopathy. Adenopathy is noted in the sub- carinal and right hilar regions, better appreciated on CT. No evident bone lesions. IMPRESSION: Large mass right lower lobe with probable surrounding pneumonitis. No new opacity. Sub- carinal and right hilar adenopathy, better seen by CT. Electronically Signed   By: Lowella Grip III M.D.   On: 02/05/2016 10:04   Dg Chest 2 View  Result Date: 01/27/2016 CLINICAL DATA:  Intermittent left lower extremity pain after the last year. 30 pound weight loss over the last 2 months. EXAM: CHEST  2 VIEW COMPARISON:  Chest CT 06/16/2007. FINDINGS: Heart size is normal. Mediastinal shadows are normal. Left lung is clear. There is consolidation and collapse in the right lower lobe. Right upper lobe is clear. Some volume loss in the right middle lobe. There is some pleural density on the right laterally. IMPRESSION: Consolidation and volume loss in the right lower lobe. Mild volume loss in the right middle lobe. Pleural density on the right laterally. Findings could be due to chronic pneumonia with volume loss, but the possibility of malignant disease does exist. Chest CT with contrast suggested when able. Electronically Signed   By: Nelson Chimes M.D.   On: 01/27/2016 12:59   Ct Chest W Contrast  Result Date: 02/05/2016 CLINICAL DATA:  55 year old male inpatient with large right lower lobe lung mass detected on recent chest CT angiogram. Bronchoscopic biopsy on 01/29/2016 demonstrated non-small cell  lung carcinoma, favor adenocarcinoma. Persistent fever. EXAM: CT CHEST WITH CONTRAST TECHNIQUE: Multidetector CT imaging of the chest was performed during intravenous contrast administration. CONTRAST:  75 cc ISOVUE-300 IOPAMIDOL (ISOVUE-300) INJECTION 61% COMPARISON:  01/27/2016 chest CT angiogram. Chest radiograph from earlier today. FINDINGS: Cardiovascular: Normal heart size. Small to moderate pericardial effusion/ thickening is not appreciably changed. Left anterior descending and right coronary atherosclerosis. Atherosclerotic nonaneurysmal thoracic aorta. Normal caliber pulmonary arteries. No central pulmonary emboli. Mediastinum/Nodes: Stable hypodense 0.6 cm left thyroid lobe nodule. Unremarkable esophagus. No axillary adenopathy. Stable mildly enlarged 1.1 cm right paratracheal node (series 2/ image 27). Stable moderately enlarged 2.4 cm subcarinal node (series 2/ image 34), heterogeneously enhancing. Stable mildly enlarged 1.2 cm prevascular right anterior mediastinal lymph node (series 2/ image 31). Stable mildly enlarged 1.2 cm right lower paraesophageal node (series 6/ image 135). Stable right hilar adenopathy measuring up to 2.0 cm (series 2/ image 32). No left hilar adenopathy. Lungs/Pleura: No pneumothorax. Stable small right pleural effusion. Stable irregular pleural thickening along the superior right major fissure (series 7/ image 62). No left pleural effusion. Mild centrilobular emphysema. Large 14.2 x 10.0 cm solid heterogeneously enhancing right lower lobe lung mass (series 7/ image 115), which is  confluent with the right hilum, and is mildly increased from 13.3 x 9.6 cm on 01/27/2016 using similar measurement technique. There is narrowing/occlusion of several right lower lobe segmental bronchi by the mass. There is extensive patchy consolidation and ground-glass attenuation throughout the periphery of the right lower lobe, slightly worsened. Two tiny right middle lobe pulmonary nodules,  largest 4 mm (series 7/image 101), slightly increased from 3 mm on 01/27/2016. Left upper lobe 5 mm solid pulmonary nodule (series 7/image 45), slightly increased from 4 mm on 01/27/2016. Upper abdomen: Unremarkable. Musculoskeletal: No aggressive appearing focal osseous lesions. Mild thoracic spondylosis. IMPRESSION: 1. Large 14.2 cm right lower lobe lung mass, consistent with biopsy-proven primary bronchogenic carcinoma, mildly increased in size since 01/27/2016. 2. Extensive patchy consolidation and ground-glass attenuation throughout the periphery of the right lower lobe, slightly worsened, consistent with postobstructive pneumonia. 3. Small right pleural effusion, stable. Stable irregular pleural thickening along the superior right major fissure, cannot exclude pleural spread of tumor. 4. Three scattered bilateral pulmonary nodules located in the right middle and left upper lobes, all slightly increased in size since 01/27/2016, suspicious for pulmonary metastases. 5. Stable right hilar adenopathy. Stable right paratracheal, prevascular right anterior mediastinal, subcarinal and right lower paraesophageal adenopathy. PET-CT can be considered for further characterization. 6. Stable small to moderate pericardial effusion/thickening. 7. Aortic atherosclerosis.  Two-vessel coronary atherosclerosis. 8. Mild centrilobular emphysema. Electronically Signed   By: Ilona Sorrel M.D.   On: 02/05/2016 14:42   Mr Jeri Cos VV Contrast  Result Date: 01/30/2016 CLINICAL DATA:  Newly diagnosed lung cancer. Evaluation for intracranial metastatic disease. EXAM: MRI HEAD WITHOUT AND WITH CONTRAST TECHNIQUE: Multiplanar, multiecho pulse sequences of the brain and surrounding structures were obtained without and with intravenous contrast. CONTRAST:  66m MULTIHANCE GADOBENATE DIMEGLUMINE 529 MG/ML IV SOLN COMPARISON:  Head CT 06/16/2007 FINDINGS: Brain: No acute infarct or intraparenchymal hemorrhage. The midline structures are  normal. No focal parenchymal signal abnormality. No mass lesion or midline shift. No hydrocephalus or extra-axial fluid collection. No contrast-enhancing lesions. Vascular: Major intracranial arterial and venous sinus flow voids are preserved. No evidence of chronic microhemorrhage or amyloid angiopathy. Skull and upper cervical spine: The visualized skull base, calvarium, upper cervical spine and extracranial soft tissues are normal. Sinuses/Orbits: Complete opacification of the left maxillary sinus. Small amount of bilateral mastoid fluid. Normal orbits. IMPRESSION: No intracranial metastatic disease. Electronically Signed   By: KUlyses JarredM.D.   On: 01/30/2016 00:40   Dg Chest Port 1 View  Result Date: 02/03/2016 CLINICAL DATA:  Chest mass. EXAM: PORTABLE CHEST 1 VIEW COMPARISON:  Radiograph of January 29, 2016. CT scan of January 27, 2016. FINDINGS: The heart size and mediastinal contours are within normal limits. Left lung is clear. No pneumothorax is noted. Stable appearance of large right lower lobe lung mass is noted with associated minimal pleural effusion. The visualized skeletal structures are unremarkable. IMPRESSION: Stable appearance of large right lung mass is noted as described on prior exams. Electronically Signed   By: JMarijo Conception M.D.   On: 02/03/2016 13:05   Dg Chest Port 1 View  Result Date: 01/29/2016 CLINICAL DATA:  Status post bronchoscopic biopsy. EXAM: PORTABLE CHEST 1 VIEW COMPARISON:  Chest x-ray and chest CT 01/27/2016 FINDINGS: Large right lower lobe lung mass is again demonstrated with right hilar adenopathy. No postprocedural pneumothorax is identified. The left lung remains clear. IMPRESSION: Stable large right lower lobe lung mass. No postprocedural pneumothorax. Electronically Signed   By: PMarijo Sanes  M.D.   On: 01/29/2016 12:26   Ct Angio Chest/abd/pel For Dissection W And/or W/wo  Result Date: 01/27/2016 CLINICAL DATA:  Intermittent LEFT lower extremity  pain for 1 year and intermittent foot numbness. 30 pound weight loss in 2 months. Follow-up possible chest malignancy. EXAM: CT ANGIOGRAPHY CHEST, ABDOMEN AND PELVIS TECHNIQUE: Multidetector CT imaging through the chest, abdomen and pelvis was performed using the standard protocol during bolus administration of intravenous contrast. Multiplanar reconstructed images and MIPs were obtained and reviewed to evaluate the vascular anatomy. CONTRAST:  80 cc Isovue 370 COMPARISON:  Chest radiograph January 27, 2016 at 1238 hours FINDINGS: CTA CHEST FINDINGS CARDIOVASCULAR: Thoracic aorta is normal course and caliber. No intrinsic density on noncontrast CT. Homogeneous contrast opacification of thoracic aorta without dissection, aneurysm, luminal irregularity, periaortic fluid collections, or contrast extravasation. Trace coronary artery calcifications. Heart size is normal. No pericardial effusion. MEDIASTINUM/NODES: 2.5 mm sub carinal nodal conglomeration. 12 mm RIGHT anterior mediastinal/ prevascular necrotic lymph node. RIGHT hilar lymphadenopathy contiguous with RIGHT lung mass. LUNGS/PLEURA: 9.1 x 13.2 x 13.9 cm RIGHT lung base mass contiguous with the RIGHT hilum, and is contiguous with the diaphragm and pleura. Effaced RIGHT lower lobe bronchi. RIGHT hilar lymphadenopathy narrows the RIGHT middle lobe bronchi. Mass encases the RIGHT lower lobe pulmonary arteries which are patent. No chest wall invasion. Patchy ground-glass nodules and opacities RIGHT lower lobe. LEFT lung base dependent atelectasis. Mild centrilobular emphysema. 3 mm RIGHT middle lobe pulmonary nodule versus granuloma. MUSCULOSKELETAL: Nonsuspicious. Sub cm LEFT thyroid nodule below size followup recommendations. Review of the MIP images confirms the above findings. CTA ABDOMEN AND PELVIS FINDINGS ARTERIES: Abdominal aorta is normal course and caliber. Mild calcific atherosclerosis. 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 Reconstitution of distal LEFT external iliac artery. Reconstitution of distal LEFT internal iliac arteries. Homogeneous contrast opacification of the aorta vessels without dissection, aneurysm, luminal irregularity, periaortic fluid collections, or contrast extravasation. Celiac axis, superior and inferior mesenteric arteries are patent. Moderate stenosis inferior mesenteric artery origin. HEPATOBILIARY: Liver and gallbladder are normal. PANCREAS: Normal. SPLEEN: Normal. ADRENALS/URINARY TRACT: Kidneys are orthotopic, demonstrating symmetric enhancement. No nephrolithiasis, hydronephrosis or solid renal masses. 4.8 cm RIGHT interpolar benign-appearing cyst. The unopacified ureters are normal in course and caliber. Urinary bladder is well distended and unremarkable. Normal adrenal glands. STOMACH/BOWEL: The stomach, small and large bowel are normal in course and caliber without inflammatory changes decreased sensitivity without enteric contrast. Normal appendix. VASCULAR/LYMPHATIC: No lymphadenopathy by CT size criteria. REPRODUCTIVE: Normal. OTHER: No intraperitoneal free fluid or free air. MUSCULOSKELETAL: Nonacute. Bridging sacroiliac osteophytes. Nondisplaced RIGHT L3, L4 healing transverse process fractures. Review of the MIP images confirms the above findings. IMPRESSION: CTA CHEST: 9 x 1 x 13.2 x 13.9 cm RIGHT lung mass contiguous with the hilum compatible with neoplasm, primary lung versus lymphoma. Mediastinal lymphadenopathy. RIGHT lower lobe probable postobstructive pneumonia. Small RIGHT pleural effusion. Moderate emphysema. No acute vascular process. 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 Reconstitution distal LEFT internal external iliac arteries. Mild atherosclerosis. Acute to subacute nondisplaced RIGHT L3 and L4 transverse process fractures appear traumatic, not pathologic. Acute findings discussed with and reconfirmed by  Dr.WARREN JONES on 01/27/2016 at 5:45 pm. Electronically Signed   By: Elon Alas M.D.   On: 01/27/2016 17:50     PERTINENT LAB RESULTS: CBC:  Recent Labs  02/04/16 0835  WBC 18.5*  HGB 7.6*  HCT 25.0*  PLT 651*   CMET  CMP     Component Value Date/Time   NA 134 (L) 02/03/2016 0914   K 3.7 02/03/2016 0914   CL 102 02/03/2016 0914   CO2 24 02/03/2016 0914   GLUCOSE 108 (H) 02/03/2016 0914   BUN 9 02/03/2016 0914   CREATININE 0.60 (L) 02/03/2016 0914   CALCIUM 8.6 (L) 02/03/2016 0914   PROT 7.8 01/28/2016 0608   ALBUMIN 1.9 (L) 01/28/2016 0608   AST 15 01/28/2016 0608   ALT 10 (L) 01/28/2016 0608   ALKPHOS 283 (H) 01/28/2016 0608   BILITOT 0.3 01/28/2016 0608   GFRNONAA >60 02/03/2016 0914   GFRAA >60 02/03/2016 0914    GFR Estimated Creatinine Clearance: 91.3 mL/min (by C-G formula based on SCr of 0.6 mg/dL (L)). No results for input(s): LIPASE, AMYLASE in the last 72 hours.  Recent Labs  02/05/16 0645  CKTOTAL 11*   Invalid input(s): POCBNP No results for input(s): DDIMER in the last 72 hours. No results for input(s): HGBA1C in the last 72 hours. No results for input(s): CHOL, HDL, LDLCALC, TRIG, CHOLHDL, LDLDIRECT in the last 72 hours. No results for input(s): TSH, T4TOTAL, T3FREE, THYROIDAB in the last 72 hours.  Invalid input(s): FREET3 No results for input(s): VITAMINB12, FOLATE, FERRITIN, TIBC, IRON, RETICCTPCT in the last 72 hours. Coags: No results for input(s): INR in the last 72 hours.  Invalid input(s): PT Microbiology: Recent Results (from the past 240 hour(s))  Culture, blood (routine x 2)     Status: None   Collection Time: 01/30/16  9:00 AM  Result Value Ref Range Status   Specimen Description BLOOD RIGHT ANTECUBITAL  Final   Special Requests BOTTLES DRAWN AEROBIC AND ANAEROBIC 10CC  Final   Culture NO GROWTH 5 DAYS  Final   Report Status 02/04/2016 FINAL  Final  Culture, blood (routine x 2)     Status: None   Collection Time:  01/30/16  9:15 AM  Result Value Ref Range Status   Specimen Description BLOOD RIGHT ANTECUBITAL  Final   Special Requests BOTTLES DRAWN AEROBIC ONLY [place]  Final   Culture NO GROWTH 5 DAYS  Final   Report Status 02/04/2016 FINAL  Final  Culture, blood (routine x 2)     Status: None (Preliminary result)   Collection Time: 02/03/16  3:35 PM  Result Value Ref Range Status   Specimen Description BLOOD RIGHT ARM  Final   Special Requests BOTTLES DRAWN AEROBIC ONLY 10CC  Final   Culture NO GROWTH 2 DAYS  Final   Report Status PENDING  Incomplete  Culture, blood (routine x 2)     Status: None (Preliminary result)   Collection Time: 02/03/16  3:40 PM  Result Value Ref Range Status   Specimen Description BLOOD RIGHT HAND  Final   Special Requests BOTTLES DRAWN AEROBIC ONLY 10CC  Final   Culture NO GROWTH 2 DAYS  Final   Report Status PENDING  Incomplete    FURTHER DISCHARGE INSTRUCTIONS:  Get Medicines reviewed and adjusted: Please take all your medications with you for your next visit with your Primary MD  Laboratory/radiological data: Please request your Primary MD to go over all hospital tests and procedure/radiological results at the follow up, please ask your Primary MD to get all Hospital records sent to his/her office.  In some cases, they will be blood work, cultures and biopsy results pending at the time of your discharge. Please request that your primary care M.D. goes through all the records of your hospital data and  follows up on these results.  Also Note the following: If you experience worsening of your admission symptoms, develop shortness of breath, life threatening emergency, suicidal or homicidal thoughts you must seek medical attention immediately by calling 911 or calling your MD immediately  if symptoms less severe.  You must read complete instructions/literature along with all the possible adverse reactions/side effects for all the Medicines you take and that have been  prescribed to you. Take any new Medicines after you have completely understood and accpet all the possible adverse reactions/side effects.   Do not drive when taking Pain medications or sleeping medications (Benzodaizepines)  Do not take more than prescribed Pain, Sleep and Anxiety Medications. It is not advisable to combine anxiety,sleep and pain medications without talking with your primary care practitioner  Special Instructions: If you have smoked or chewed Tobacco  in the last 2 yrs please stop smoking, stop any regular Alcohol  and or any Recreational drug use.  Wear Seat belts while driving.  Please note: You were cared for by a hospitalist during your hospital stay. Once you are discharged, your primary care physician will handle any further medical issues. Please note that NO REFILLS for any discharge medications will be authorized once you are discharged, as it is imperative that you return to your primary care physician (or establish a relationship with a primary care physician if you do not have one) for your post hospital discharge needs so that they can reassess your need for medications and monitor your lab values.  Total Time spent coordinating discharge including counseling, education and face to face time equals 45 minutes.  SignedOren Binet 02/06/2016 2:33 PM

## 2016-02-06 NOTE — Progress Notes (Signed)
CM met with pt in room and gave pt [place] letter with list of participating pharmacies.  Pt verbalized understanding of all MATCH parameters.  CM gave pt [facility] brochure but pt now states he is seen by [facility] who has started the insurance process and is seen for follow up medical care.  No other CM needs were communicated.

## 2016-02-08 ENCOUNTER — Telehealth: Payer: Self-pay | Admitting: Hematology

## 2016-02-08 LAB — CULTURE, BLOOD (ROUTINE X 2)
CULTURE: NO GROWTH
Culture: NO GROWTH

## 2016-02-08 NOTE — Telephone Encounter (Signed)
Lt vm

## 2016-02-08 NOTE — Telephone Encounter (Signed)
Phone number in EPIC, isn't a contact number the patient.

## 2016-02-16 ENCOUNTER — Encounter: Payer: Self-pay | Admitting: Family Medicine

## 2016-02-16 ENCOUNTER — Ambulatory Visit: Payer: Self-pay | Attending: Internal Medicine

## 2016-02-16 ENCOUNTER — Ambulatory Visit (INDEPENDENT_AMBULATORY_CARE_PROVIDER_SITE_OTHER): Payer: Medicaid Other | Admitting: Family Medicine

## 2016-02-16 VITALS — BP 110/64 | HR 58 | Temp 98.3°F | Ht 66.0 in | Wt 134.0 lb

## 2016-02-16 DIAGNOSIS — R509 Fever, unspecified: Secondary | ICD-10-CM | POA: Diagnosis not present

## 2016-02-16 DIAGNOSIS — C3411 Malignant neoplasm of upper lobe, right bronchus or lung: Secondary | ICD-10-CM | POA: Diagnosis not present

## 2016-02-16 DIAGNOSIS — I739 Peripheral vascular disease, unspecified: Secondary | ICD-10-CM | POA: Diagnosis not present

## 2016-02-16 DIAGNOSIS — D509 Iron deficiency anemia, unspecified: Secondary | ICD-10-CM | POA: Diagnosis not present

## 2016-02-16 DIAGNOSIS — C3490 Malignant neoplasm of unspecified part of unspecified bronchus or lung: Secondary | ICD-10-CM

## 2016-02-16 LAB — CBC
HCT: 27.6 % — ABNORMAL LOW (ref 38.5–50.0)
HEMOGLOBIN: 8.1 g/dL — AB (ref 13.2–17.1)
MCH: 20.6 pg — AB (ref 27.0–33.0)
MCHC: 29.3 g/dL — AB (ref 32.0–36.0)
MCV: 70.1 fL — ABNORMAL LOW (ref 80.0–100.0)
MPV: 8.1 fL (ref 7.5–12.5)
Platelets: 694 10*3/uL — ABNORMAL HIGH (ref 140–400)
RBC: 3.94 MIL/uL — ABNORMAL LOW (ref 4.20–5.80)
RDW: 18.9 % — AB (ref 11.0–15.0)
WBC: 23 10*3/uL — AB (ref 3.8–10.8)

## 2016-02-16 LAB — BASIC METABOLIC PANEL WITH GFR
BUN: 11 mg/dL (ref 7–25)
CALCIUM: 8.6 mg/dL (ref 8.6–10.3)
CO2: 21 mmol/L (ref 20–31)
CREATININE: 0.59 mg/dL — AB (ref 0.70–1.33)
Chloride: 99 mmol/L (ref 98–110)
GLUCOSE: 100 mg/dL — AB (ref 65–99)
Potassium: 4.6 mmol/L (ref 3.5–5.3)
SODIUM: 132 mmol/L — AB (ref 135–146)

## 2016-02-16 NOTE — Assessment & Plan Note (Signed)
On aspirin. Has follow up with vascular surgery next week. Will check lipid panel at next visit. Would consider addition of statin pending discussion with oncology.

## 2016-02-16 NOTE — Patient Instructions (Signed)
We will check blood work today.  Please keep your appointment with the oncologist tomorrow.  Please keep your appointment with vascular surgery next week.  Come back to see me in 1-2 weeks.  Take care,  Dr Jerline Pain

## 2016-02-16 NOTE — Assessment & Plan Note (Signed)
Likely secondary to malignancy with iron deficiency. Will follow up with oncology tomorrow. Check CBC today.

## 2016-02-16 NOTE — Assessment & Plan Note (Signed)
Likely related to malignancy. Continue augmentin. Follow up with oncology tomorrow.

## 2016-02-16 NOTE — Assessment & Plan Note (Signed)
Has follow up with oncology tomorrow. Appears to be likely adenocarcinoma.

## 2016-02-16 NOTE — Progress Notes (Signed)
Subjective:  [name] Chapman is a 55 y.o. male who presents to the [facility] today with a chief complaint of hospital follow up and to establish care.   HPI:  Right Lower Lobe Lung Mass Patient was admitted to the hospital last month with worsening lower extremity pain (see problem below) and was incidentally found to have a right lower lung mass in the ED. Patient underwent work up for this including bronchoscopy with biopsy that showed likely adenocarcinoma. Patient has a long standing history of tobacco abuse. He will be following up with oncology tomorrow.   Peripheral Vascular Disease Patient presented to the ED initially with worsening lower extremity pain. During his admission he had a CT angiogram that showed complete occulsion of the left common iliac artery. Vascular surgery was consulted with reocmmendations for percutaneous revascularization  Once medically stable. He was started on aspirin. He will be following up with vascular surgery on 12/15.   Fever During his hospitalization, patient was also noted to have a fever of unknown origin. He had two sets of blood cultures which were negative, though he did have a CT scan which showed a possible postobstructive pneumonia. Patient was discharged home on a 2 week course of augmentin. He had a mild fever to 101F yesterday and took tylenol which alleviated the fever.   Microcytic Anemia Patient was found to have persistent microcytic anemia during his admission, likely secondary to malignancy. His iron panel was however also consistent with iron deficiency (low total iron and saturation ratio). He received one unit of pRBC in while admitted.   ROS: Per HPI, otherwise all systems reviewed and are negative  PMH:  The following were reviewed and entered/updated in epic: No past medical history on file. Patient Active Problem List   Diagnosis Date Noted  . FUO (fever of unknown origin)   . S/P bronchoscopy with biopsy   . Malignant  neoplasm of upper lobe of right lung ([place])   . Postobstructive pneumonia 01/30/2016  . PVD (peripheral vascular disease) ([place]) 01/30/2016  . Lumbar transverse process fracture ([place]) 01/30/2016  . Tobacco abuse 01/30/2016  . Alcohol abuse 01/30/2016  . Protein-calorie malnutrition, severe 01/28/2016  . Leg pain, inferior, left 01/27/2016  . Microcytic hypochromic anemia 01/27/2016   Past Surgical History:  Procedure Laterality Date  . VIDEO BRONCHOSCOPY Bilateral 01/29/2016   Procedure: VIDEO BRONCHOSCOPY WITH FLUORO;  Surgeon: Collene Gobble, MD;  Location: [place];  Service: Cardiopulmonary;  Laterality: Bilateral;    Family History  Problem Relation Age of Onset  . CAD Father     Medications- reviewed and updated Current Outpatient Prescriptions  Medication Sig Dispense Refill  . amoxicillin-clavulanate (AUGMENTIN) 875-125 MG tablet Take 1 tablet by mouth 2 (two) times daily. 18 tablet 0  . aspirin EC 81 MG tablet Take 1 tablet (81 mg total) by mouth daily. 30 tablet 0  . famotidine (PEPCID) 20 MG tablet Take 1 tablet (20 mg total) by mouth daily. 30 tablet 0  . feeding supplement, ENSURE ENLIVE, (ENSURE ENLIVE) LIQD Take 237 mLs by mouth 2 (two) times daily between meals. 60 Bottle 0  . Multiple Vitamin (MULTIVITAMIN WITH MINERALS) TABS tablet Take 1 tablet by mouth daily. 30 tablet 0  . naproxen (NAPROSYN) 500 MG tablet Take 1 tablet (500 mg total) by mouth 2 (two) times daily as needed (FEVER >101). 15 tablet 0  . nicotine (NICODERM CQ - DOSED IN MG/24 HOURS) 14 mg/24hr patch Place 1 patch (14 mg total)  onto the skin at bedtime. 28 patch 0  . thiamine 100 MG tablet Take 1 tablet (100 mg total) by mouth daily. 30 tablet 0   No current facility-administered medications for this visit.     Allergies-reviewed and updated No Known Allergies  Social History   Social History  . Marital status: Single    Spouse name: N/A  . Number of children: N/A  . Years of education:  N/A   Social History Main Topics  . Smoking status: Light Tobacco Smoker    Packs/day: 1.00  . Smokeless tobacco: Never Used  . Alcohol use 3.6 oz/week    6 Cans of beer per week     Comment: drinks daily  . Drug use: No  . Sexual activity: Not Asked   Other Topics Concern  . None   Social History Narrative  . None   Objective:  Physical Exam: BP 110/64   Pulse (!) 58   Temp 98.3 F (36.8 C) (Oral)   Ht '5\' 6"'$  (1.676 m)   Wt 134 lb (60.8 kg)   BMI 21.63 kg/m   Gen: Thin, frail appearing 55yo man in NAD, resting comfortably CV: RRR with no murmurs appreciated Pulm: NWOB, CTAB with no crackles, wheezes, or rhonchi GI: Normal bowel sounds present. Soft, Nontender, Nondistended. MSK: no edema, cyanosis, or clubbing noted Skin: warm, dry Neuro: grossly normal, moves all extremities Psych: Normal affect and thought content  Assessment/Plan:  Malignant neoplasm of upper lobe of right lung [facility]) Has follow up with oncology tomorrow. Appears to be likely adenocarcinoma.   PVD (peripheral vascular disease) ([place]) On aspirin. Has follow up with vascular surgery next week. Will check lipid panel at next visit. Would consider addition of statin pending discussion with oncology.   FUO (fever of unknown origin) Likely related to malignancy. Continue augmentin. Follow up with oncology tomorrow.   Microcytic hypochromic anemia Likely secondary to malignancy with iron deficiency. Will follow up with oncology tomorrow. Check CBC today.   Algis Greenhouse. Jerline Pain, [facility] Resident PGY-3 02/16/2016 4:41 PM

## 2016-02-17 ENCOUNTER — Telehealth: Payer: Self-pay | Admitting: Hematology

## 2016-02-17 ENCOUNTER — Ambulatory Visit (HOSPITAL_BASED_OUTPATIENT_CLINIC_OR_DEPARTMENT_OTHER): Payer: Medicaid Other | Admitting: Hematology

## 2016-02-17 ENCOUNTER — Encounter: Payer: Self-pay | Admitting: Hematology

## 2016-02-17 ENCOUNTER — Encounter: Payer: Self-pay | Admitting: Surgery

## 2016-02-17 VITALS — BP 105/59 | HR 101 | Temp 98.1°F | Resp 18 | Ht 66.0 in | Wt 131.1 lb

## 2016-02-17 DIAGNOSIS — D649 Anemia, unspecified: Secondary | ICD-10-CM

## 2016-02-17 DIAGNOSIS — C3491 Malignant neoplasm of unspecified part of right bronchus or lung: Secondary | ICD-10-CM | POA: Diagnosis not present

## 2016-02-17 MED ORDER — DEXAMETHASONE 2 MG PO TABS: 2.0000 mg | ORAL_TABLET | Freq: Every day | ORAL | 0 refills | Status: DC

## 2016-02-17 NOTE — Telephone Encounter (Signed)
Message sent to the infusion scheduler to be added. Rad/Onc appointment was scheduled for 02/22/16 @ 2:30 pm. ( s/w melanie) Appointments scheduled per 02/17/16 los. A copy of the AVS report and appointment schedule was given to the patient, per 02/17/16 los.

## 2016-02-18 ENCOUNTER — Ambulatory Visit (HOSPITAL_COMMUNITY)
Admission: RE | Admit: 2016-02-18 | Discharge: 2016-02-18 | Disposition: A | Discharge status: Home / Self Care | Payer: Medicaid Other | Source: Ambulatory Visit | Attending: Hematology | Admitting: Hematology

## 2016-02-18 ENCOUNTER — Telehealth: Payer: Self-pay | Admitting: Hematology

## 2016-02-18 ENCOUNTER — Other Ambulatory Visit (HOSPITAL_BASED_OUTPATIENT_CLINIC_OR_DEPARTMENT_OTHER): Payer: Medicaid Other

## 2016-02-18 DIAGNOSIS — D649 Anemia, unspecified: Secondary | ICD-10-CM | POA: Insufficient documentation

## 2016-02-18 DIAGNOSIS — C3491 Malignant neoplasm of unspecified part of right bronchus or lung: Secondary | ICD-10-CM

## 2016-02-18 LAB — COMPREHENSIVE METABOLIC PANEL
ALK PHOS: 828 U/L — AB (ref 40–150)
ALT: 21 U/L (ref 0–55)
AST: 35 U/L — ABNORMAL HIGH (ref 5–34)
Albumin: <2 g/dL — ABNORMAL LOW (ref 3.5–5.0)
Anion Gap: 9 mEq/L (ref 3–11)
BILIRUBIN TOTAL: 0.46 mg/dL (ref 0.20–1.20)
BUN: 10.5 mg/dL (ref 7.0–26.0)
CO2: 21 meq/L — AB (ref 22–29)
CREATININE: 0.6 mg/dL — AB (ref 0.7–1.3)
Calcium: 9.3 mg/dL (ref 8.4–10.4)
Chloride: 104 mEq/L (ref 98–109)
EGFR: >90 mL/min/{1.73_m2} (ref >90)
GLUCOSE: 86 mg/dL (ref 70–140)
Potassium: 4.8 mEq/L (ref 3.5–5.1)
SODIUM: 134 meq/L — AB (ref 136–145)
TOTAL PROTEIN: 8.5 g/dL — AB (ref 6.4–8.3)

## 2016-02-18 LAB — CBC & DIFF AND RETIC
BASO%: 0.2 % (ref 0.0–2.0)
BASOS ABS: 0.1 10*3/uL (ref 0.0–0.1)
EOS ABS: 0.2 10*3/uL (ref 0.0–0.5)
EOS%: 1 % (ref 0.0–7.0)
HCT: 24.3 % — ABNORMAL LOW (ref 38.4–49.9)
HEMOGLOBIN: 7.4 g/dL — AB (ref 13.0–17.1)
IMMATURE RETIC FRACT: 17.1 % — AB (ref 3.00–10.60)
LYMPH%: 10.7 % — AB (ref 14.0–49.0)
MCH: 20.4 pg — ABNORMAL LOW (ref 27.2–33.4)
MCHC: 30.5 g/dL — ABNORMAL LOW (ref 32.0–36.0)
MCV: 67.1 fL — AB (ref 79.3–98.0)
MONO#: 2.5 10*3/uL — AB (ref 0.1–0.9)
MONO%: 11.8 % (ref 0.0–14.0)
NEUT%: 76.3 % — ABNORMAL HIGH (ref 39.0–75.0)
NEUTROS ABS: 16.5 10*3/uL — AB (ref 1.5–6.5)
NRBC: 0 % (ref 0–0)
PLATELETS: 577 10*3/uL — AB (ref 140–400)
RBC: 3.62 10*6/uL — AB (ref 4.20–5.82)
RDW: 19.4 % — ABNORMAL HIGH (ref 11.0–14.6)
Retic %: 0.78 % — ABNORMAL LOW (ref 0.80–1.80)
Retic Ct Abs: 28.24 10*3/uL — ABNORMAL LOW (ref 34.80–93.90)
WBC: 21.6 10*3/uL — AB (ref 4.0–10.3)
lymph#: 2.3 10*3/uL (ref 0.9–3.3)

## 2016-02-18 LAB — PREPARE RBC (CROSSMATCH)

## 2016-02-18 LAB — ABO/RH: ABO/RH(D): A NEG

## 2016-02-18 LAB — TECHNOLOGIST REVIEW

## 2016-02-18 NOTE — Telephone Encounter (Signed)
Transfusion scheduled for 02/19/16 @ [facility]. Patient should arrive around 10:30 a.m. For appointment. Called patient to confirm appointment. Left a voice mail with appointment information. 02/18/16

## 2016-02-19 ENCOUNTER — Ambulatory Visit (HOSPITAL_COMMUNITY)
Admission: RE | Admit: 2016-02-19 | Discharge: 2016-02-19 | Disposition: A | Discharge status: Home / Self Care | Payer: Medicaid Other | Source: Ambulatory Visit | Attending: Hematology | Admitting: Hematology

## 2016-02-19 DIAGNOSIS — C3491 Malignant neoplasm of unspecified part of right bronchus or lung: Secondary | ICD-10-CM | POA: Diagnosis not present

## 2016-02-19 DIAGNOSIS — D649 Anemia, unspecified: Secondary | ICD-10-CM

## 2016-02-19 MED ORDER — SODIUM CHLORIDE 0.9 % IV SOLN
250.0000 mL | Freq: Once | INTRAVENOUS | Status: AC
  Administered 2016-02-19: 250 mL via INTRAVENOUS

## 2016-02-19 MED ORDER — SODIUM CHLORIDE 0.9% FLUSH: 3.0000 mL | INTRAVENOUS | Status: DC | PRN

## 2016-02-19 MED ORDER — HEPARIN SOD (PORK) LOCK FLUSH 100 UNIT/ML IV SOLN: 250.0000 [IU] | INTRAVENOUS | Status: DC | PRN

## 2016-02-19 MED ORDER — SODIUM CHLORIDE 0.9% FLUSH: 10.0000 mL | INTRAVENOUS | Status: DC | PRN

## 2016-02-19 MED ORDER — ACETAMINOPHEN 325 MG PO TABS
650.0000 mg | ORAL_TABLET | Freq: Once | ORAL | Status: AC
  Administered 2016-02-19: 650 mg via ORAL
  Filled 2016-02-19: qty 2

## 2016-02-19 MED ORDER — HEPARIN SOD (PORK) LOCK FLUSH 100 UNIT/ML IV SOLN: 500.0000 [IU] | Freq: Every day | INTRAVENOUS | Status: DC | PRN

## 2016-02-19 MED ORDER — DIPHENHYDRAMINE HCL 25 MG PO CAPS
25.0000 mg | ORAL_CAPSULE | Freq: Once | ORAL | Status: AC
  Administered 2016-02-19: 25 mg via ORAL
  Filled 2016-02-19: qty 1

## 2016-02-19 NOTE — Discharge Instructions (Signed)

## 2016-02-19 NOTE — Progress Notes (Signed)
Patient ID: [name] Chapman, male   DOB: [birth year], 55 y.o.   MRN: [id number] Procedure: Transfusion of 2 units PRBC  Provider:Gautam Juleen China, MD  Associated Diagnosis: : Primary adenocarcinoma of right lung ([place]) (C34.91); Symptomatic anemia (D64.

## 2016-02-22 ENCOUNTER — Ambulatory Visit
Admission: RE | Admit: 2016-02-22 | Discharge: 2016-02-22 | Disposition: A | Discharge status: Home / Self Care | Payer: Medicaid Other | Source: Ambulatory Visit | Attending: Radiation Oncology | Admitting: Radiation Oncology

## 2016-02-22 ENCOUNTER — Encounter: Payer: Self-pay | Admitting: Radiation Oncology

## 2016-02-22 VITALS — BP 115/67 | HR 102 | Temp 99.1°F | Resp 18 | Ht 66.0 in | Wt 129.2 lb

## 2016-02-22 DIAGNOSIS — C3491 Malignant neoplasm of unspecified part of right bronchus or lung: Secondary | ICD-10-CM

## 2016-02-22 DIAGNOSIS — Z79899 Other long term (current) drug therapy: Secondary | ICD-10-CM | POA: Insufficient documentation

## 2016-02-22 DIAGNOSIS — I959 Hypotension, unspecified: Secondary | ICD-10-CM | POA: Diagnosis not present

## 2016-02-22 DIAGNOSIS — Z7982 Long term (current) use of aspirin: Secondary | ICD-10-CM | POA: Insufficient documentation

## 2016-02-22 DIAGNOSIS — Z510 Encounter for antineoplastic radiation therapy: Secondary | ICD-10-CM | POA: Diagnosis present

## 2016-02-22 DIAGNOSIS — C3431 Malignant neoplasm of lower lobe, right bronchus or lung: Secondary | ICD-10-CM | POA: Diagnosis not present

## 2016-02-22 DIAGNOSIS — J029 Acute pharyngitis, unspecified: Secondary | ICD-10-CM | POA: Insufficient documentation

## 2016-02-22 DIAGNOSIS — C3411 Malignant neoplasm of upper lobe, right bronchus or lung: Secondary | ICD-10-CM

## 2016-02-22 HISTORY — DX: Malignant neoplasm of unspecified part of unspecified bronchus or lung: C34.90

## 2016-02-22 LAB — TYPE AND SCREEN
BLOOD PRODUCT EXPIRATION DATE: 201712152359
Blood Product Expiration Date: 201712152359
ISSUE DATE / TIME: 201712081128
ISSUE DATE / TIME: 201712081128
Unit Type and Rh: 600
Unit Type and Rh: 600

## 2016-02-22 NOTE — Progress Notes (Signed)
See progress note under physician encounter. 

## 2016-02-22 NOTE — Progress Notes (Signed)
[name]    HEMATOLOGY/ONCOLOGY CONSULTATION NOTE  Date of Service: .02/17/2016  Patient Care Team: [name], MD as PCP - General (Family Medicine)  CHIEF COMPLAINTS/PURPOSE OF CONSULTATION:  Post -hospitalization followup for newly diagnosed lung adenocarcinoma.  HISTORY OF PRESENTING ILLNESS:  [name] Chapman is a 55 y.o. male who has been referred to [place] by Dr Evalee Mutton Kristeen Mans, MD for evaluation and management of newly noted Lung mass and anemia.  Patient has had no significant medical followup and no known medical history who presented with worsening LLE pain for about 1 week. He has a h/o smoking 1PPD.  He noted a productive cough and 25-30lbs weight loss in the last 2-3 months. CXR showed a Rt lung mass.  Patient had a CTA of the chest/abd/pelvis which showed 9 x 1 x 13.2 x 13.9 cm RIGHT lung mass contiguous with the hilum compatible with neoplasm, primary lung versus lymphoma. Mediastinal lymphadenopathy. RIGHT lower lobe probable postobstructive pneumonia. Small RIGHT pleural effusion. 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 Reconstitution distal LEFT internal external iliac arteries. Mild atherosclerosis. Acute to subacute nondisplaced RIGHT L3 and L4 transverse process fractures appear traumatic, not pathologic.  Vascular surgery involved and recommended outpatient workup. He was admitted to expedite his w/u for newly diagnosed lung mass. He was noted to have microcytic anemia with hgb down to 7 with nl ferritin and neg FOBT suggesting likely anemia of chronic disease from his malignancy.  Patient subsequently had a bronchoscopic biopsy by Dr. Lamonte Sakai which was consistent with an adenocarcinoma of the lung. Patient needed IV antibiotics for treatment of postobstructive pneumonia.  He has a follow-up with vascular surgery Dr Aleen Campi in clinic for management of his peripheral arterial disease.    Patient comes for follow-up in clinic after being  recently discharged from the hospital. He notes his breathing is stable though he still has a cough. His left foot feels numb and cold similar to that he was in the hospital. He continues to be on aspirin at this time. Is having some low-grade fevers at home. Eating and drinking well. Notes that he is in the process of applying for Social Security disability. He was given our Education officer, museum scars in clinic if he needed any help with this. We discussed the need for PRBC transfusion given his significant anemia and he was agreeable to this. His foundation 1 results are still pending at this time. No hemoptysis.    MEDICAL HISTORY:  History reviewed. No pertinent past medical history.  SURGICAL HISTORY: Past Surgical History:  Procedure Laterality Date  . VIDEO BRONCHOSCOPY Bilateral 01/29/2016   Procedure: VIDEO BRONCHOSCOPY WITH FLUORO;  Surgeon: Collene Gobble, MD;  Location: [facility];  Service: Cardiopulmonary;  Laterality: Bilateral;    SOCIAL HISTORY: Social History   Social History  . Marital status: Single    Spouse name: N/A  . Number of children: N/A  . Years of education: N/A   Occupational History  . Not on file.   Social History Main Topics  . Smoking status: Light Tobacco Smoker    Packs/day: 1.00  . Smokeless tobacco: Never Used  . Alcohol use 3.6 oz/week    6 Cans of beer per week     Comment: drinks daily  . Drug use: No  . Sexual activity: Not on file   Other Topics Concern  . Not on file   Social History Narrative  . No narrative on file    FAMILY HISTORY: Family  History  Problem Relation Age of Onset  . CAD Father     ALLERGIES:  has No Known Allergies.  MEDICATIONS:  Current Outpatient Prescriptions  Medication Sig Dispense Refill  . amoxicillin-clavulanate (AUGMENTIN) 875-125 MG tablet Take 1 tablet by mouth 2 (two) times daily. 18 tablet 0  . aspirin EC 81 MG tablet Take 1 tablet (81 mg total) by mouth daily. 30 tablet 0  . famotidine  (PEPCID) 20 MG tablet Take 1 tablet (20 mg total) by mouth daily. 30 tablet 0  . feeding supplement, ENSURE ENLIVE, (ENSURE ENLIVE) LIQD Take 237 mLs by mouth 2 (two) times daily between meals. 60 Bottle 0  . Multiple Vitamin (MULTIVITAMIN WITH MINERALS) TABS tablet Take 1 tablet by mouth daily. 30 tablet 0  . naproxen (NAPROSYN) 500 MG tablet Take 1 tablet (500 mg total) by mouth 2 (two) times daily as needed (FEVER >101). 15 tablet 0  . nicotine (NICODERM CQ - DOSED IN MG/24 HOURS) 14 mg/24hr patch Place 1 patch (14 mg total) onto the skin at bedtime. 28 patch 0  . thiamine 100 MG tablet Take 1 tablet (100 mg total) by mouth daily. 30 tablet 0   No current facility-administered medications for this visit.     REVIEW OF SYSTEMS:    10 Point review of Systems was done is negative except as noted above.  PHYSICAL EXAMINATION: ECOG PERFORMANCE STATUS: 2 - Symptomatic, <50% confined to bed  . Vitals:   02/17/16 1516  BP: (!) 105/59  Pulse: (!) 101  Resp: 18  Temp: 98.1 F (36.7 C)   Filed Weights   02/17/16 1516  Weight: 131 lb 1.6 oz (59.5 kg)   .Body mass index is 21.16 kg/m.  GENERAL:alert, in no acute distress and comfortable SKIN: skin color, texture, turgor are normal, no rashes or significant lesions EYES: normal, conjunctiva are pink and non-injected, sclera clear OROPHARYNX:no exudate, no erythema and lips, buccal mucosa, and tongue normal  NECK: supple, no JVD, thyroid normal size, non-tender, without nodularity LYMPH:  no palpable lymphadenopathy in the cervical, axillary or inguinal LUNGS: Decreased air entry at right lung with a few scattered rhonchi HEART: regular rate & rhythm,  no murmurs and no lower extremity edema ABDOMEN: abdomen soft, non-tender, normoactive bowel sounds  Musculoskeletal: Left foot somewhat colder than the right foot PSYCH: alert & oriented x 3 with fluent speech NEURO: no focal motor/sensory deficits  LABORATORY DATA:  I have reviewed  the data as listed  . CBC Latest Ref Rng & Units 02/18/2016 02/16/2016 02/04/2016  WBC 4.0 - 10.3 10e3/uL 21.6(H) 23.0(H) 18.5(H)  Hemoglobin 13.0 - 17.1 g/dL 7.4(L) 8.1(L) 7.6(L)  Hematocrit 38.4 - 49.9 % 24.3(L) 27.6(L) 25.0(L)  Platelets 140 - 400 10e3/uL 577(H) 694(H) 651(H)    . CMP Latest Ref Rng & Units 02/18/2016 02/16/2016 02/03/2016  Glucose 70 - 140 mg/dl 86 100(H) 108(H)  BUN 7.0 - 26.0 mg/dL 10.'5 11 9  ' Creatinine 0.7 - 1.3 mg/dL 0.6(L) 0.59(L) 0.60(L)  Sodium 136 - 145 mEq/L 134(L) 132(L) 134(L)  Potassium 3.5 - 5.1 mEq/L 4.8 4.6 3.7  Chloride 98 - 110 mmol/L - 99 102  CO2 22 - 29 mEq/L 21(L) 21 24  Calcium 8.4 - 10.4 mg/dL 9.3 8.6 8.6(L)  Total Protein 6.4 - 8.3 g/dL 8.5(H) - -  Total Bilirubin 0.20 - 1.20 mg/dL 0.46 - -  Alkaline Phos 40 - 150 U/L 828(H) - -  AST 5 - 34 U/L 35(H) - -  ALT 0 -  55 U/L 21 - -   Component     Latest Ref Rng & Units 02/05/2016  Hepatitis B Surface Ag     Negative Negative  HCV Ab     0.0 - 0.9 s/co ratio <0.1  Hep A Ab, IgM     Negative Negative  Hep B Core Ab, IgM     Negative Negative  HIV     Non Reactive Non Reactive  CRP     <1.0 mg/dL 19.1 (H)  Sed Rate     0 - 16 mm/hr 140 (H)  RA Latex Turbid.     0.0 - 13.9 IU/mL 74.6 (H)     RADIOGRAPHIC STUDIES: I have personally reviewed the radiological images as listed and agreed with the findings in the report. Dg Orthopantogram  Result Date: 02/04/2016 CLINICAL DATA:  Fever of unknown origin EXAM: ORTHOPANTOGRAM/PANORAMIC COMPARISON:  None. FINDINGS: The patient is missing all of his maxillary teeth and many of his mandibular teeth. The single remaining tooth in the right mandible demonstrates mild periapical lucency. No other abnormalities. IMPRESSION: There is suggested minimal periapical lucency adjacent to the root of the single remaining right mandibular tooth. Electronically Signed   By: Dorise Bullion III M.D   On: 02/04/2016 17:48   Dg Chest 2 View  Result Date:  02/05/2016 CLINICAL DATA:  Fever EXAM: CHEST  2 VIEW COMPARISON:  February 03, 2016 chest radiograph and chest CT January 27, 2016 FINDINGS: There is a persistent large mass occupying much of the right lower lobe with probable surrounding pneumonitis. Lungs elsewhere clear. Heart size and pulmonary vascularity are normal. No adenopathy. Adenopathy is noted in the sub- carinal and right hilar regions, better appreciated on CT. No evident bone lesions. IMPRESSION: Large mass right lower lobe with probable surrounding pneumonitis. No new opacity. Sub- carinal and right hilar adenopathy, better seen by CT. Electronically Signed   By: Lowella Grip III M.D.   On: 02/05/2016 10:04   Dg Chest 2 View  Result Date: 01/27/2016 CLINICAL DATA:  Intermittent left lower extremity pain after the last year. 30 pound weight loss over the last 2 months. EXAM: CHEST  2 VIEW COMPARISON:  Chest CT 06/16/2007. FINDINGS: Heart size is normal. Mediastinal shadows are normal. Left lung is clear. There is consolidation and collapse in the right lower lobe. Right upper lobe is clear. Some volume loss in the right middle lobe. There is some pleural density on the right laterally. IMPRESSION: Consolidation and volume loss in the right lower lobe. Mild volume loss in the right middle lobe. Pleural density on the right laterally. Findings could be due to chronic pneumonia with volume loss, but the possibility of malignant disease does exist. Chest CT with contrast suggested when able. Electronically Signed   By: Nelson Chimes M.D.   On: 01/27/2016 12:59   Ct Chest W Contrast  Result Date: 02/05/2016 CLINICAL DATA:  55 year old male inpatient with large right lower lobe lung mass detected on recent chest CT angiogram. Bronchoscopic biopsy on 01/29/2016 demonstrated non-small cell lung carcinoma, favor adenocarcinoma. Persistent fever. EXAM: CT CHEST WITH CONTRAST TECHNIQUE: Multidetector CT imaging of the chest was performed during  intravenous contrast administration. CONTRAST:  75 cc ISOVUE-300 IOPAMIDOL (ISOVUE-300) INJECTION 61% COMPARISON:  01/27/2016 chest CT angiogram. Chest radiograph from earlier today. FINDINGS: Cardiovascular: Normal heart size. Small to moderate pericardial effusion/ thickening is not appreciably changed. Left anterior descending and right coronary atherosclerosis. Atherosclerotic nonaneurysmal thoracic aorta. Normal caliber pulmonary arteries. No central pulmonary  emboli. Mediastinum/Nodes: Stable hypodense 0.6 cm left thyroid lobe nodule. Unremarkable esophagus. No axillary adenopathy. Stable mildly enlarged 1.1 cm right paratracheal node (series 2/ image 27). Stable moderately enlarged 2.4 cm subcarinal node (series 2/ image 34), heterogeneously enhancing. Stable mildly enlarged 1.2 cm prevascular right anterior mediastinal lymph node (series 2/ image 31). Stable mildly enlarged 1.2 cm right lower paraesophageal node (series 6/ image 135). Stable right hilar adenopathy measuring up to 2.0 cm (series 2/ image 32). No left hilar adenopathy. Lungs/Pleura: No pneumothorax. Stable small right pleural effusion. Stable irregular pleural thickening along the superior right major fissure (series 7/ image 62). No left pleural effusion. Mild centrilobular emphysema. Large 14.2 x 10.0 cm solid heterogeneously enhancing right lower lobe lung mass (series 7/ image 115), which is confluent with the right hilum, and is mildly increased from 13.3 x 9.6 cm on 01/27/2016 using similar measurement technique. There is narrowing/occlusion of several right lower lobe segmental bronchi by the mass. There is extensive patchy consolidation and ground-glass attenuation throughout the periphery of the right lower lobe, slightly worsened. Two tiny right middle lobe pulmonary nodules, largest 4 mm (series 7/image 101), slightly increased from 3 mm on 01/27/2016. Left upper lobe 5 mm solid pulmonary nodule (series 7/image 45), slightly  increased from 4 mm on 01/27/2016. Upper abdomen: Unremarkable. Musculoskeletal: No aggressive appearing focal osseous lesions. Mild thoracic spondylosis. IMPRESSION: 1. Large 14.2 cm right lower lobe lung mass, consistent with biopsy-proven primary bronchogenic carcinoma, mildly increased in size since 01/27/2016. 2. Extensive patchy consolidation and ground-glass attenuation throughout the periphery of the right lower lobe, slightly worsened, consistent with postobstructive pneumonia. 3. Small right pleural effusion, stable. Stable irregular pleural thickening along the superior right major fissure, cannot exclude pleural spread of tumor. 4. Three scattered bilateral pulmonary nodules located in the right middle and left upper lobes, all slightly increased in size since 01/27/2016, suspicious for pulmonary metastases. 5. Stable right hilar adenopathy. Stable right paratracheal, prevascular right anterior mediastinal, subcarinal and right lower paraesophageal adenopathy. PET-CT can be considered for further characterization. 6. Stable small to moderate pericardial effusion/thickening. 7. Aortic atherosclerosis.  Two-vessel coronary atherosclerosis. 8. Mild centrilobular emphysema. Electronically Signed   By: Ilona Sorrel M.D.   On: 02/05/2016 14:42   Mr Jeri Cos EE Contrast  Result Date: 01/30/2016 CLINICAL DATA:  Newly diagnosed lung cancer. Evaluation for intracranial metastatic disease. EXAM: MRI HEAD WITHOUT AND WITH CONTRAST TECHNIQUE: Multiplanar, multiecho pulse sequences of the brain and surrounding structures were obtained without and with intravenous contrast. CONTRAST:  54m MULTIHANCE GADOBENATE DIMEGLUMINE 529 MG/ML IV SOLN COMPARISON:  Head CT 06/16/2007 FINDINGS: Brain: No acute infarct or intraparenchymal hemorrhage. The midline structures are normal. No focal parenchymal signal abnormality. No mass lesion or midline shift. No hydrocephalus or extra-axial fluid collection. No contrast-enhancing  lesions. Vascular: Major intracranial arterial and venous sinus flow voids are preserved. No evidence of chronic microhemorrhage or amyloid angiopathy. Skull and upper cervical spine: The visualized skull base, calvarium, upper cervical spine and extracranial soft tissues are normal. Sinuses/Orbits: Complete opacification of the left maxillary sinus. Small amount of bilateral mastoid fluid. Normal orbits. IMPRESSION: No intracranial metastatic disease. Electronically Signed   By: KUlyses JarredM.D.   On: 01/30/2016 00:40   Dg Chest Port 1 View  Result Date: 02/03/2016 CLINICAL DATA:  Chest mass. EXAM: PORTABLE CHEST 1 VIEW COMPARISON:  Radiograph of January 29, 2016. CT scan of January 27, 2016. FINDINGS: The heart size and mediastinal contours are within normal limits. Left  lung is clear. No pneumothorax is noted. Stable appearance of large right lower lobe lung mass is noted with associated minimal pleural effusion. The visualized skeletal structures are unremarkable. IMPRESSION: Stable appearance of large right lung mass is noted as described on prior exams. Electronically Signed   By: Marijo Conception, M.D.   On: 02/03/2016 13:05   Dg Chest Port 1 View  Result Date: 01/29/2016 CLINICAL DATA:  Status post bronchoscopic biopsy. EXAM: PORTABLE CHEST 1 VIEW COMPARISON:  Chest x-ray and chest CT 01/27/2016 FINDINGS: Large right lower lobe lung mass is again demonstrated with right hilar adenopathy. No postprocedural pneumothorax is identified. The left lung remains clear. IMPRESSION: Stable large right lower lobe lung mass. No postprocedural pneumothorax. Electronically Signed   By: Marijo Sanes M.D.   On: 01/29/2016 12:26   Ct Angio Chest/abd/pel For Dissection W And/or W/wo  Result Date: 01/27/2016 CLINICAL DATA:  Intermittent LEFT lower extremity pain for 1 year and intermittent foot numbness. 30 pound weight loss in 2 months. Follow-up possible chest malignancy. EXAM: CT ANGIOGRAPHY CHEST,  ABDOMEN AND PELVIS TECHNIQUE: Multidetector CT imaging through the chest, abdomen and pelvis was performed using the standard protocol during bolus administration of intravenous contrast. Multiplanar reconstructed images and MIPs were obtained and reviewed to evaluate the vascular anatomy. CONTRAST:  80 cc Isovue 370 COMPARISON:  Chest radiograph January 27, 2016 at 1238 hours FINDINGS: CTA CHEST FINDINGS CARDIOVASCULAR: Thoracic aorta is normal course and caliber. No intrinsic density on noncontrast CT. Homogeneous contrast opacification of thoracic aorta without dissection, aneurysm, luminal irregularity, periaortic fluid collections, or contrast extravasation. Trace coronary artery calcifications. Heart size is normal. No pericardial effusion. MEDIASTINUM/NODES: 2.5 mm sub carinal nodal conglomeration. 12 mm RIGHT anterior mediastinal/ prevascular necrotic lymph node. RIGHT hilar lymphadenopathy contiguous with RIGHT lung mass. LUNGS/PLEURA: 9.1 x 13.2 x 13.9 cm RIGHT lung base mass contiguous with the RIGHT hilum, and is contiguous with the diaphragm and pleura. Effaced RIGHT lower lobe bronchi. RIGHT hilar lymphadenopathy narrows the RIGHT middle lobe bronchi. Mass encases the RIGHT lower lobe pulmonary arteries which are patent. No chest wall invasion. Patchy ground-glass nodules and opacities RIGHT lower lobe. LEFT lung base dependent atelectasis. Mild centrilobular emphysema. 3 mm RIGHT middle lobe pulmonary nodule versus granuloma. MUSCULOSKELETAL: Nonsuspicious. Sub cm LEFT thyroid nodule below size followup recommendations. Review of the MIP images confirms the above findings. CTA ABDOMEN AND PELVIS FINDINGS ARTERIES: Abdominal aorta is normal course and caliber. Mild calcific atherosclerosis. 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 Reconstitution of distal LEFT external iliac artery. Reconstitution of distal LEFT internal iliac arteries. Homogeneous  contrast opacification of the aorta vessels without dissection, aneurysm, luminal irregularity, periaortic fluid collections, or contrast extravasation. Celiac axis, superior and inferior mesenteric arteries are patent. Moderate stenosis inferior mesenteric artery origin. HEPATOBILIARY: Liver and gallbladder are normal. PANCREAS: Normal. SPLEEN: Normal. ADRENALS/URINARY TRACT: Kidneys are orthotopic, demonstrating symmetric enhancement. No nephrolithiasis, hydronephrosis or solid renal masses. 4.8 cm RIGHT interpolar benign-appearing cyst. The unopacified ureters are normal in course and caliber. Urinary bladder is well distended and unremarkable. Normal adrenal glands. STOMACH/BOWEL: The stomach, small and large bowel are normal in course and caliber without inflammatory changes decreased sensitivity without enteric contrast. Normal appendix. VASCULAR/LYMPHATIC: No lymphadenopathy by CT size criteria. REPRODUCTIVE: Normal. OTHER: No intraperitoneal free fluid or free air. MUSCULOSKELETAL: Nonacute. Bridging sacroiliac osteophytes. Nondisplaced RIGHT L3, L4 healing transverse process fractures. Review of the MIP images confirms the above findings. IMPRESSION: CTA CHEST: 9 x  1 x 13.2 x 13.9 cm RIGHT lung mass contiguous with the hilum compatible with neoplasm, primary lung versus lymphoma. Mediastinal lymphadenopathy. RIGHT lower lobe probable postobstructive pneumonia. Small RIGHT pleural effusion. Moderate emphysema. No acute vascular process. 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 Reconstitution distal LEFT internal external iliac arteries. Mild atherosclerosis. Acute to subacute nondisplaced RIGHT L3 and L4 transverse process fractures appear traumatic, not pathologic. Acute findings discussed with and reconfirmed by Dr.WARREN JONES on 01/27/2016 at 5:45 pm. Electronically Signed   By: Elon Alas M.D.   On: 01/27/2016 17:50    ASSESSMENT & PLAN:    1)  Rt Lung large lung biopsy proven lung adenocarcinoma with  concern for pleural thickening and pleural involvement, with mediastinal LN and other separate (ipsilateral pulmonary nodules T4) Atleast Stage IIIB and possibly Stage IV. MRI brain neg for brain mets. 2) ?Post-obstructive pneumonia 3) Emphysema PLAN -Foundation One and PDL1 testing results pending -Will send out peripheral blood EGFR testing as well -complete staging with PET/CT -patient is to see radiation oncology to weigh in as well. Given bronchial obstruction would be reasonable to  plan and proceed with radiation to the lung mass. -If this is Stage IIIB disease on PET/CT will consider adding concurrent chemotherapy with Carboplatin + (Alimta or taxol) -if Stage IV which is concern with elevated ALK Phosphatase suggestive of Bone involvement - would likely need palliative Lung RT followed by consideration of systemic therapy is based on performance status and Foundation 1/PDL 1 results.  4) Microcytic Anemia  FOBT. Likely predominantly anemia of chronic disease Significantly elevated inflammatory markers. Also has elevated rheumatoid factor levels ?RA. No overt focal joint symptoms at this time.  . RecentLabs       Lab Results  Component Value Date   IRON 7 (L) 01/28/2016   TIBC 176 (L) 01/28/2016   IRONPCTSAT 4 (L) 01/28/2016     (Iron and TIBC)  RecentLabs       Lab Results  Component Value Date   FERRITIN 651 (H) 01/28/2016    Plan -transfuse to maintain Hgb close to 9 in the setting of upcoming treatment and limb ischemia symptoms. - transfusion ordered with patient's consent .  5) Left commoni iliac artery occlusion. 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 -likely related to PAD due to smoking. -outpatient f/u with vascular surgery -Counseled on smoking cessation  6) Compression acute vs subacute fracture of rt L3 and L4 transverse processes  -pain mx. Pain is currently controlled   7)  Smoking -counseled on importance of smoking cessation.  PET/CT scan within 3-5 days -Radiation oncology referral -Set up for PRBC transfusion in 1-2 days -Return to clinic with Dr. Irene Limbo in 7-10 days soon after PET scan.   All of the patients questions were answered with apparent satisfaction. The patient knows to call the clinic with any problems, questions or concerns.  I spent 40 minutes counseling the patient face to face. The total time spent in the appointment was 50 minutes and more than 50% was on counseling and direct patient cares.    Sullivan Lone MD [place] AAHIVMS [facility] Hematology/Oncology Physician [facility]  (Office):       [phone] (Work cell):  [phone] (Fax):           [phone]

## 2016-02-22 NOTE — Progress Notes (Signed)
[facility]         [phone] ________________________________  Initial Outpatient Consultation  Name: [name] Chapman MRN: [id number]  Date: 02/22/2016  DOB: 07/25/1960  REFERRING PHYSICIAN: Brunetta Genera, MD  DIAGNOSIS: The encounter diagnosis was Malignant neoplasm of upper lobe of right lung ([place]).    ICD-9-CM ICD-10-CM   1. Malignant neoplasm of upper lobe of right lung (HCC) 162.3 C34.11     HISTORY OF PRESENT ILLNESS:[name] Chapman is a 55 y.o. 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  The patient presented to the ED with worsening lower extremity pain. CT angiogram revealed complete occlusion of the left common iliac artery. During work up the patient was found to have right lower lobe on CTPA. The patient had a subsequent bronchoscopy with biopsy done on 01/29/16, which was sent for molecular testing. Finale pathology revealed non small cell carcinoma, with staining pattern consistent with adenocarcinoma.  Pending results the patient may be a candidate for immunotherapy. He has a CT chest on 02/05/16 which revealed a large 14.2 cm tumor in the lower lobe on the right with narrowing/occlusion of the right lower lobe segmental bronchi, two tiny right pulmonary nodules measuring 50m and a left upper lobe 574msolid pulmonary nodule, which was increased since the prior scan. There was concern for a small right sided pleural effusion.  Also noted was a  2.4 cm subcarinal node, 1.2 cm prevascular right anterior mediastinal node, and a 1.2 cm right lower paraesophageal node, and a 2cm area of right hilar adenopathy was present. Stable thyroid nodule measuring 50m58mThe patient is scheduled to follow up with Dr. KalIrene Limbo 02/25/16. He also has a PET scan scheduled for 03/02/16. He has had a negative MRI of the brain on 01/30/16.  On review of systems, the patient is a former ppd smoker, but quit while hospitalized in November.  He  reports a 25-30 lbs weight loss in the last 3 months. He is now drinking 2 Ensure per day. The patient denies any respiratory complaints, hemoptysis, or pain. The patient is accompanied to the clinic today by his wife. He denies chest wall pain or upper extremity edema. He denies any abdominal pain, nausea, fevers, chills, bowel or bladder dysfunction. A complete review of systems is obtained and is otherwise negative.    PREVIOUS RADIATION THERAPY: No Past Medical History:  Past Medical History:  Diagnosis Date  . [facility] [place]  non small cell lung cancer right lower lobe lung    Past Surgical History: Past Surgical History:  Procedure Laterality Date  . VIDEO BRONCHOSCOPY Bilateral 01/29/2016   Procedure: VIDEO BRONCHOSCOPY WITH FLUORO;  Surgeon: RobCollene GobbleD;  [facility]  Laterality: Bilateral;    Social History:  Social History   Social History  . Marital status: Single    Spouse name: N/A  . Number of children: N/A  . Years of education: N/A   Occupational History  . Not on file.   Social History Main Topics  . Smoking status: Former Smoker    Packs/day: 1.00    Years: 30.00    Types: Cigarettes    Quit date: 01/21/2016  . Smokeless tobacco: Never Used  . Alcohol use No     Comment: reports he used to drink approximately six beers per week but, stopped since diagnosis.  . Drug use: No  . Sexual activity: Not Currently  Other Topics Concern  . Not on file   Social History Narrative  . No narrative on file    Family History: Family History  Problem Relation Age of Onset  . CAD Father   . Cancer Neg Hx    Medications:  Current Outpatient Prescriptions  Medication Sig Dispense Refill  . acetaminophen (TYLENOL) 325 MG tablet Take 650 mg by mouth every 6 (six) hours as needed.    [name] aspirin EC 81 MG tablet Take 1 tablet (81 mg total) by mouth daily. 30 tablet 0  . famotidine (PEPCID) 20 MG tablet Take 1  tablet (20 mg total) by mouth daily. 30 tablet 0  . feeding supplement, ENSURE ENLIVE, (ENSURE ENLIVE) LIQD Take 237 mLs by mouth 2 (two) times daily between meals. 60 Bottle 0  . Multiple Vitamin (MULTIVITAMIN WITH MINERALS) TABS tablet Take 1 tablet by mouth daily. 30 tablet 0  . nicotine (NICODERM CQ - DOSED IN MG/24 HOURS) 14 mg/24hr patch Place 1 patch (14 mg total) onto the skin at bedtime. 28 patch 0   No current facility-administered medications for this encounter.    Allergies: No Known Allergies   PHYSICAL EXAM:  Blood pressure 115/67, pulse (!) 102, temperature 99.1 F (37.3 C), temperature source Oral, resp. rate 18, height '5\' 6"'$  (1.676 m), weight 129 lb 3.2 oz (58.6 kg), SpO2 96 %.  In general this is a well appearing African American male in no acute distress. He is alert and oriented x4 and appropriate throughout the examination. HEENT reveals that the patient is normocephalic, atraumatic. EOMs are intact. PERRLA. Skin is intact without any evidence of gross lesions. Cardiovascular exam reveals a regular rate and rhythm, no clicks rubs or murmurs are auscultated. Chest is clear to auscultation bilaterally. Lymphatic assessment is performed and does not reveal any adenopathy in the cervical, supraclavicular, axillary, or inguinal chains. Abdomen has active bowel sounds in all quadrants and is intact. The abdomen is soft, non tender, non distended. Lower extremities are negative for pretibial pitting edema, deep calf tenderness, cyanosis or clubbing.   LABORATORY DATA:  Lab Results  Component Value Date   WBC 21.6 (H) 02/18/2016   HGB 7.4 (L) 02/18/2016   HCT 24.3 (L) 02/18/2016   MCV 67.1 (L) 02/18/2016   PLT 577 (H) 02/18/2016   Lab Results  Component Value Date   NA 134 (L) 02/18/2016   K 4.8 02/18/2016   CL 99 02/16/2016   CO2 21 (L) 02/18/2016   Lab Results  Component Value Date   ALT 21 02/18/2016   AST 35 (H) 02/18/2016   ALKPHOS 828 (H) 02/18/2016   BILITOT  0.46 02/18/2016     RADIOGRAPHY: Dg Orthopantogram  Result Date: 02/04/2016 CLINICAL DATA:  Fever of unknown origin EXAM: ORTHOPANTOGRAM/PANORAMIC COMPARISON:  None. FINDINGS: The patient is missing all of his maxillary teeth and many of his mandibular teeth. The single remaining tooth in the right mandible demonstrates mild periapical lucency. No other abnormalities. IMPRESSION: There is suggested minimal periapical lucency adjacent to the root of the single remaining right mandibular tooth. Electronically Signed   By: Dorise Bullion III M.D   On: 02/04/2016 17:48   Dg Chest 2 View  Result Date: 02/05/2016 CLINICAL DATA:  Fever EXAM: CHEST  2 VIEW COMPARISON:  February 03, 2016 chest radiograph and chest CT January 27, 2016 FINDINGS: There is a persistent large mass occupying much of the right lower lobe with probable surrounding pneumonitis. Lungs elsewhere clear. Heart size and pulmonary  vascularity are normal. No adenopathy. Adenopathy is noted in the sub- carinal and right hilar regions, better appreciated on CT. No evident bone lesions. IMPRESSION: Large mass right lower lobe with probable surrounding pneumonitis. No new opacity. Sub- carinal and right hilar adenopathy, better seen by CT. Electronically Signed   By: Lowella Grip III M.D.   On: 02/05/2016 10:04   Dg Chest 2 View  Result Date: 01/27/2016 CLINICAL DATA:  Intermittent left lower extremity pain after the last year. 30 pound weight loss over the last 2 months. EXAM: CHEST  2 VIEW COMPARISON:  Chest CT 06/16/2007. FINDINGS: Heart size is normal. Mediastinal shadows are normal. Left lung is clear. There is consolidation and collapse in the right lower lobe. Right upper lobe is clear. Some volume loss in the right middle lobe. There is some pleural density on the right laterally. IMPRESSION: Consolidation and volume loss in the right lower lobe. Mild volume loss in the right middle lobe. Pleural density on the right laterally.  Findings could be due to chronic pneumonia with volume loss, but the possibility of malignant disease does exist. Chest CT with contrast suggested when able. Electronically Signed   By: Nelson Chimes M.D.   On: 01/27/2016 12:59   Ct Chest W Contrast  Result Date: 02/05/2016 CLINICAL DATA:  55 year old male inpatient with large right lower lobe lung mass detected on recent chest CT angiogram. Bronchoscopic biopsy on 01/29/2016 demonstrated non-small cell lung carcinoma, favor adenocarcinoma. Persistent fever. EXAM: CT CHEST WITH CONTRAST TECHNIQUE: Multidetector CT imaging of the chest was performed during intravenous contrast administration. CONTRAST:  75 cc ISOVUE-300 IOPAMIDOL (ISOVUE-300) INJECTION 61% COMPARISON:  01/27/2016 chest CT angiogram. Chest radiograph from earlier today. FINDINGS: Cardiovascular: Normal heart size. Small to moderate pericardial effusion/ thickening is not appreciably changed. Left anterior descending and right coronary atherosclerosis. Atherosclerotic nonaneurysmal thoracic aorta. Normal caliber pulmonary arteries. No central pulmonary emboli. Mediastinum/Nodes: Stable hypodense 0.6 cm left thyroid lobe nodule. Unremarkable esophagus. No axillary adenopathy. Stable mildly enlarged 1.1 cm right paratracheal node (series 2/ image 27). Stable moderately enlarged 2.4 cm subcarinal node (series 2/ image 34), heterogeneously enhancing. Stable mildly enlarged 1.2 cm prevascular right anterior mediastinal lymph node (series 2/ image 31). Stable mildly enlarged 1.2 cm right lower paraesophageal node (series 6/ image 135). Stable right hilar adenopathy measuring up to 2.0 cm (series 2/ image 32). No left hilar adenopathy. Lungs/Pleura: No pneumothorax. Stable small right pleural effusion. Stable irregular pleural thickening along the superior right major fissure (series 7/ image 62). No left pleural effusion. Mild centrilobular emphysema. Large 14.2 x 10.0 cm solid heterogeneously enhancing  right lower lobe lung mass (series 7/ image 115), which is confluent with the right hilum, and is mildly increased from 13.3 x 9.6 cm on 01/27/2016 using similar measurement technique. There is narrowing/occlusion of several right lower lobe segmental bronchi by the mass. There is extensive patchy consolidation and ground-glass attenuation throughout the periphery of the right lower lobe, slightly worsened. Two tiny right middle lobe pulmonary nodules, largest 4 mm (series 7/image 101), slightly increased from 3 mm on 01/27/2016. Left upper lobe 5 mm solid pulmonary nodule (series 7/image 45), slightly increased from 4 mm on 01/27/2016. Upper abdomen: Unremarkable. Musculoskeletal: No aggressive appearing focal osseous lesions. Mild thoracic spondylosis. IMPRESSION: 1. Large 14.2 cm right lower lobe lung mass, consistent with biopsy-proven primary bronchogenic carcinoma, mildly increased in size since 01/27/2016. 2. Extensive patchy consolidation and ground-glass attenuation throughout the periphery of the right lower lobe, slightly worsened,  consistent with postobstructive pneumonia. 3. Small right pleural effusion, stable. Stable irregular pleural thickening along the superior right major fissure, cannot exclude pleural spread of tumor. 4. Three scattered bilateral pulmonary nodules located in the right middle and left upper lobes, all slightly increased in size since 01/27/2016, suspicious for pulmonary metastases. 5. Stable right hilar adenopathy. Stable right paratracheal, prevascular right anterior mediastinal, subcarinal and right lower paraesophageal adenopathy. PET-CT can be considered for further characterization. 6. Stable small to moderate pericardial effusion/thickening. 7. Aortic atherosclerosis.  Two-vessel coronary atherosclerosis. 8. Mild centrilobular emphysema. Electronically Signed   By: Ilona Sorrel M.D.   On: 02/05/2016 14:42   Mr Jeri Cos DQ Contrast  Result Date: 01/30/2016 CLINICAL DATA:   Newly diagnosed lung cancer. Evaluation for intracranial metastatic disease. EXAM: MRI HEAD WITHOUT AND WITH CONTRAST TECHNIQUE: Multiplanar, multiecho pulse sequences of the brain and surrounding structures were obtained without and with intravenous contrast. CONTRAST:  41m MULTIHANCE GADOBENATE DIMEGLUMINE 529 MG/ML IV SOLN COMPARISON:  Head CT 06/16/2007 FINDINGS: Brain: No acute infarct or intraparenchymal hemorrhage. The midline structures are normal. No focal parenchymal signal abnormality. No mass lesion or midline shift. No hydrocephalus or extra-axial fluid collection. No contrast-enhancing lesions. Vascular: Major intracranial arterial and venous sinus flow voids are preserved. No evidence of chronic microhemorrhage or amyloid angiopathy. Skull and upper cervical spine: The visualized skull base, calvarium, upper cervical spine and extracranial soft tissues are normal. Sinuses/Orbits: Complete opacification of the left maxillary sinus. Small amount of bilateral mastoid fluid. Normal orbits. IMPRESSION: No intracranial metastatic disease. Electronically Signed   By: KUlyses JarredM.D.   On: 01/30/2016 00:40   Dg Chest Port 1 View  Result Date: 02/03/2016 CLINICAL DATA:  Chest mass. EXAM: PORTABLE CHEST 1 VIEW COMPARISON:  Radiograph of January 29, 2016. CT scan of January 27, 2016. FINDINGS: The heart size and mediastinal contours are within normal limits. Left lung is clear. No pneumothorax is noted. Stable appearance of large right lower lobe lung mass is noted with associated minimal pleural effusion. The visualized skeletal structures are unremarkable. IMPRESSION: Stable appearance of large right lung mass is noted as described on prior exams. Electronically Signed   By: JMarijo Conception M.D.   On: 02/03/2016 13:05   Dg Chest Port 1 View  Result Date: 01/29/2016 CLINICAL DATA:  Status post bronchoscopic biopsy. EXAM: PORTABLE CHEST 1 VIEW COMPARISON:  Chest x-ray and chest CT 01/27/2016  FINDINGS: Large right lower lobe lung mass is again demonstrated with right hilar adenopathy. No postprocedural pneumothorax is identified. The left lung remains clear. IMPRESSION: Stable large right lower lobe lung mass. No postprocedural pneumothorax. Electronically Signed   By: PMarijo SanesM.D.   On: 01/29/2016 12:26   Ct Angio Chest/abd/pel For Dissection W And/or W/wo  Result Date: 01/27/2016 CLINICAL DATA:  Intermittent LEFT lower extremity pain for 1 year and intermittent foot numbness. 30 pound weight loss in 2 months. Follow-up possible chest malignancy. EXAM: CT ANGIOGRAPHY CHEST, ABDOMEN AND PELVIS TECHNIQUE: Multidetector CT imaging through the chest, abdomen and pelvis was performed using the standard protocol during bolus administration of intravenous contrast. Multiplanar reconstructed images and MIPs were obtained and reviewed to evaluate the vascular anatomy. CONTRAST:  80 cc Isovue 370 COMPARISON:  Chest radiograph January 27, 2016 at 1238 hours FINDINGS: CTA CHEST FINDINGS CARDIOVASCULAR: Thoracic aorta is normal course and caliber. No intrinsic density on noncontrast CT. Homogeneous contrast opacification of thoracic aorta without dissection, aneurysm, luminal irregularity, periaortic fluid collections, or contrast extravasation. Trace  coronary artery calcifications. Heart size is normal. No pericardial effusion. MEDIASTINUM/NODES: 2.5 mm sub carinal nodal conglomeration. 12 mm RIGHT anterior mediastinal/ prevascular necrotic lymph node. RIGHT hilar lymphadenopathy contiguous with RIGHT lung mass. LUNGS/PLEURA: 9.1 x 13.2 x 13.9 cm RIGHT lung base mass contiguous with the RIGHT hilum, and is contiguous with the diaphragm and pleura. Effaced RIGHT lower lobe bronchi. RIGHT hilar lymphadenopathy narrows the RIGHT middle lobe bronchi. Mass encases the RIGHT lower lobe pulmonary arteries which are patent. No chest wall invasion. Patchy ground-glass nodules and opacities RIGHT lower lobe. LEFT  lung base dependent atelectasis. Mild centrilobular emphysema. 3 mm RIGHT middle lobe pulmonary nodule versus granuloma. MUSCULOSKELETAL: Nonsuspicious. Sub cm LEFT thyroid nodule below size followup recommendations. Review of the MIP images confirms the above findings. CTA ABDOMEN AND PELVIS FINDINGS ARTERIES: Abdominal aorta is normal course and caliber. Mild calcific atherosclerosis. 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 Reconstitution of distal LEFT external iliac artery. Reconstitution of distal LEFT internal iliac arteries. Homogeneous contrast opacification of the aorta vessels without dissection, aneurysm, luminal irregularity, periaortic fluid collections, or contrast extravasation. Celiac axis, superior and inferior mesenteric arteries are patent. Moderate stenosis inferior mesenteric artery origin. HEPATOBILIARY: Liver and gallbladder are normal. PANCREAS: Normal. SPLEEN: Normal. ADRENALS/URINARY TRACT: Kidneys are orthotopic, demonstrating symmetric enhancement. No nephrolithiasis, hydronephrosis or solid renal masses. 4.8 cm RIGHT interpolar benign-appearing cyst. The unopacified ureters are normal in course and caliber. Urinary bladder is well distended and unremarkable. Normal adrenal glands. STOMACH/BOWEL: The stomach, small and large bowel are normal in course and caliber without inflammatory changes decreased sensitivity without enteric contrast. Normal appendix. VASCULAR/LYMPHATIC: No lymphadenopathy by CT size criteria. REPRODUCTIVE: Normal. OTHER: No intraperitoneal free fluid or free air. MUSCULOSKELETAL: Nonacute. Bridging sacroiliac osteophytes. Nondisplaced RIGHT L3, L4 healing transverse process fractures. Review of the MIP images confirms the above findings. IMPRESSION: CTA CHEST: 9 x 1 x 13.2 x 13.9 cm RIGHT lung mass contiguous with the hilum compatible with neoplasm, primary lung versus lymphoma. Mediastinal lymphadenopathy. RIGHT  lower lobe probable postobstructive pneumonia. Small RIGHT pleural effusion. Moderate emphysema. No acute vascular process. 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 Reconstitution distal LEFT internal external iliac arteries. Mild atherosclerosis. Acute to subacute nondisplaced RIGHT L3 and L4 transverse process fractures appear traumatic, not pathologic. Acute findings discussed with and reconfirmed by Dr.WARREN JONES on 01/27/2016 at 5:45 pm. Electronically Signed   By: Elon Alas M.D.   On: 01/27/2016 17:50      IMPRESSION/PLAN:  1. At least stage III NSCLC, adenocarcinoma. Dr. Tammi Klippel reviews the findings on imaging and from pathology. We disucssed that the work up is still pending his PET scan. With the concerns of pleural effusion and contralateral adenopathy, his disease could be stage IV. Dr. Tammi Klippel outlined the differences in approach for treatment if his disease was stage III versus stage IV. If this was stage III, he would be offered concurrent chemotherpay with radiotherapy given over the course of 6 1/2 weeks. Alternatively, which we are suspicious of, would be a palliative course if he has stage IV disease.  We discussed the risks, benefits, and side effects of radiation therapy, as well as the logistics of therapy for the patient's education. The patient is a good candidate for radiation, and again, would be offered 10 fractions delivered over a 2 week course of treatment, depending on staging from PET. The patient is in agreement and would like to proceed with radiation therapy.  The patient is scheduled for CT Simulation on 02/25/16. Written consent is obtained and we will proceed accordingly. 2. Occluded left common iliac artery. The patient is going to follow up with vascular surgery on 02/26/16 to discuss revascularization. 3. Nutrition and weight loss. I will refer the patient to meet with Ernestene Kiel to discuss strategies for  maintaining/gaining weight. 4. Social needs. The patient is out of work until he is well enough to return, and does not have any short term disability in place. We will ask for the patient to meet with social work and our Animator to see how we can help him. I did discuss it might be best at this time to consider disability.  In a visit lasting 60 minutes, greater than 50% of the time was spent face to face discussing his pathology and imaging findings, and coordinating the patient's care.  The above documentation reflects my direct findings during this shared patient visit. Please see the separate note by Dr. Tammi Klippel on this date for the remainder of the patient's plan of care.     Carola Rhine, PAC     This document serves as a record of services personally performed by [name], MD and Shona Simpson, PA. It was created on his behalf by Maryla Morrow, a trained medical scribe. The creation of this record is based on the scribe's personal observations and the provider's statements to them. This document has been checked and approved by the attending provider.

## 2016-02-22 NOTE — Progress Notes (Signed)
Thoracic Location of Tumor / Histology: Non small cell lung cancer of right lower lobe lung  Patient presented to emergency room with worsening lower extremity pain. CT angiogram revealed complete occulsion of the left common iliac artery. Incidentally patient was found to have right lower lobe lung mass during work up.     Sent for molecular testing.  Tobacco/Marijuana/Snuff/ETOH use: former ppd smoker but, quit while hospitalized in November. Requesting refill of nicotine patch.  Past/Anticipated interventions by cardiothoracic surgery, if any: no Bronchoscopy with biopsy done.   Past/Anticipated interventions by medical oncology, if any: None. Scheduled to follow up with [facility] on 02/25/16.  PET scheduled for 03/02/16.  Signs/Symptoms  Weight changes, if any: 25-30 lb weight loss in 3 months. Now drinking two Ensure per day.  Respiratory complaints, if any: no  Hemoptysis, if any: no  Pain issues, if any:  no  SAFETY ISSUES:  Prior radiation? no  Pacemaker/ICD? no   Possible current pregnancy?no  Is the patient on methotrexate? no  Current Complaints / other details:  55 year old.

## 2016-02-23 ENCOUNTER — Encounter (HOSPITAL_COMMUNITY): Payer: Self-pay

## 2016-02-24 ENCOUNTER — Encounter (HOSPITAL_COMMUNITY): Payer: Self-pay

## 2016-02-24 LAB — EPIDERMAL GROWTH FACTOR RECEPTOR (EGFR) MUTATION ANALYSIS

## 2016-02-25 ENCOUNTER — Encounter: Payer: Self-pay | Admitting: Nutrition

## 2016-02-25 ENCOUNTER — Ambulatory Visit: Payer: Self-pay | Admitting: Hematology

## 2016-02-25 ENCOUNTER — Ambulatory Visit
Admission: RE | Admit: 2016-02-25 | Discharge: 2016-02-25 | Disposition: A | Discharge status: Home / Self Care | Payer: Medicaid Other | Source: Ambulatory Visit | Attending: Radiation Oncology | Admitting: Radiation Oncology

## 2016-02-25 DIAGNOSIS — C3411 Malignant neoplasm of upper lobe, right bronchus or lung: Secondary | ICD-10-CM

## 2016-02-25 DIAGNOSIS — Z510 Encounter for antineoplastic radiation therapy: Secondary | ICD-10-CM | POA: Diagnosis not present

## 2016-02-25 NOTE — Progress Notes (Signed)
  Radiation Oncology         [phone] ________________________________  Name: [name] Chapman MRN: [id number]  Date: 02/25/2016  DOB: 06-17-1960  SIMULATION AND TREATMENT PLANNING NOTE    ICD-9-CM ICD-10-CM   1. Malignant neoplasm of upper lobe of right lung ([facility]) 162.3 C34.11     DIAGNOSIS:  55 yo man with stage III adenocarcinoma of the right lower lung cancer pending PET  NARRATIVE:  The patient was brought to the [facility].  Identity was confirmed.  All relevant records and images related to the planned course of therapy were reviewed.  The patient freely provided informed written consent to proceed with treatment after reviewing the details related to the planned course of therapy. The consent form was witnessed and verified by the simulation staff.  Then, the patient was set-up in a stable reproducible  supine position for radiation therapy.  CT images were obtained.  Surface markings were placed.  The CT images were loaded into the planning software.  Then the target and avoidance structures were contoured.  Treatment planning then occurred.  The radiation prescription was entered and confirmed.  Then, I designed and supervised the construction of a total of 6 medically necessary complex treatment devices, including a BodyFix immobilization mold custom fitted to the patient along with 5 multileaf collimators conformally shaped radiation around the treatment target while shielding critical structures such as the heart and spinal cord maximally.  I have requested : 3D Simulation  I have requested a DVH of the following structures: Left lung, right lung, spinal cord, heart, esophagus, and target.  I have ordered:Nutrition Consult  SPECIAL TREATMENT PROCEDURE:  The planned course of therapy using radiation constitutes a special treatment procedure. Special care is required in the management of this patient for the following reasons.  The patient will be receiving concurrent  chemotherapy requiring careful monitoring for increased toxicities of treatment including periodic laboratory values.  The special nature of the planned course of radiotherapy will require increased physician supervision and oversight to ensure patient's safety with optimal treatment outcomes.  PLAN:  The patient will receive 66 Gy in 33 fractions.  ________________________________  Sheral Apley Tammi Klippel, M.D.

## 2016-02-26 ENCOUNTER — Ambulatory Visit (INDEPENDENT_AMBULATORY_CARE_PROVIDER_SITE_OTHER): Payer: Medicaid Other | Admitting: Surgery

## 2016-02-26 ENCOUNTER — Encounter: Payer: Self-pay | Admitting: Surgery

## 2016-02-26 VITALS — BP 88/55 | HR 89 | Temp 97.0°F | Resp 18 | Ht 66.0 in | Wt 131.0 lb

## 2016-02-26 DIAGNOSIS — I70212 Atherosclerosis of native arteries of extremities with intermittent claudication, left leg: Secondary | ICD-10-CM

## 2016-02-26 NOTE — Progress Notes (Signed)
[facility] [place]  Patient name: [name] Chapman MRN: [id number] DOB: 05/25/1960 Sex: male  REASON FOR VISIT: followup  HPI: [name] Chapman is a 55 y.o. male who I met in November 2017 when he presented to the emergency department with worsening left leg pain over the past week.  He states that he has had tingling in his leg for approximately 6 months.  He has also suffered from difficulty with ambulation for 6 months and can walk approximately 100 feet.  He also had difficulty going up steps, developing pain after going up 3 steps.  He denied open wounds or rest pain.  While in the ER he underwent a CT scan which showed a large lung mass.  It also showed a left iliac occlusion.  Because of the lung mass, intervention on the left leg was deferred, given that his symptoms have been present for at least 6 months and he did not have any open wounds.  The patient has subsequently been diagnosed with adenocarcinoma of the lung.  He is scheduled to undergo chemotherapy and radiation to start shortly.  He does not have any changes in the symptoms of his foot.  Past Medical History:  Diagnosis Date  . 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 CAD Father   . Cancer Neg Hx     SOCIAL HISTORY: Social History  Substance Use Topics  . Smoking status: Former Smoker    Packs/day: 1.00    Years: 30.00    Types: Cigarettes    Quit date: 01/21/2016  . Smokeless tobacco: Never Used  . Alcohol use No     Comment: reports he used to drink approximately six beers per week but, stopped since diagnosis.    No Known Allergies  Current Outpatient Prescriptions  Medication Sig Dispense Refill  . acetaminophen (TYLENOL) 325 MG tablet Take 650 mg by mouth every 6 (six) hours as needed.    [name] aspirin EC 81 MG tablet Take 1 tablet (81 mg total) by mouth daily. 30 tablet 0    . famotidine (PEPCID) 20 MG tablet Take 1 tablet (20 mg total) by mouth daily. 30 tablet 0  . feeding supplement, ENSURE ENLIVE, (ENSURE ENLIVE) LIQD Take 237 mLs by mouth 2 (two) times daily between meals. 60 Bottle 0  . Multiple Vitamin (MULTIVITAMIN WITH MINERALS) TABS tablet Take 1 tablet by mouth daily. 30 tablet 0  . nicotine (NICODERM CQ - DOSED IN MG/24 HOURS) 14 mg/24hr patch Place 1 patch (14 mg total) onto the skin at bedtime. 28 patch 0   No current facility-administered medications for this visit.     REVIEW OF SYSTEMS:  '[X]'  denotes positive finding, '[ ]'  denotes negative finding Cardiac  Comments:  Chest pain or chest pressure:    Shortness of breath upon exertion:    Short of breath when lying flat:    Irregular heart rhythm:        Vascular    Pain in calf, thigh, or hip brought on by ambulation: x   Pain in feet at night that wakes you up from your sleep:  x   Blood clot in your veins: x   Leg swelling:         Pulmonary    Oxygen at home:    Productive cough:     Wheezing:  Neurologic    Sudden weakness in arms or legs:  x   Sudden numbness in arms or legs:  x   Sudden onset of difficulty speaking or slurred speech: x   Temporary loss of vision in one eye:     Problems with dizziness:         Gastrointestinal    Blood in stool:     Vomited blood:         Genitourinary    Burning when urinating:     Blood in urine:        Psychiatric    Major depression:         Hematologic    Bleeding problems:    Problems with blood clotting too easily:        Skin    Rashes or ulcers:        Constitutional    Fever or chills: x     PHYSICAL EXAM: Vitals:   02/26/16 1103  BP: (!) 88/55  Pulse: 89  Resp: 18  Temp: 97 F (36.1 C)  TempSrc: Oral  SpO2: 97%  Weight: 131 lb (59.4 kg)  Height: '5\' 6"'  (1.676 m)    GENERAL: The patient is a well-nourished male, in no acute distress. The vital signs are documented above. CARDIAC: There is a  regular rate and rhythm.  VASCULAR: Palpable right dorsalis pedis pulse.  Nonpalpable left PULMONARY: Non-labored respirations MUSCULOSKELETAL: There are no major deformities or cyanosis. NEUROLOGIC: Normal motor and sensory function in both feet. SKIN: There are no ulcers or rashes noted. PSYCHIATRIC: The patient has a normal affect.  DATA:  None today  MEDICAL ISSUES: Left iliac occlusion: The patient's symptoms remain stable.  I would defer any vascular intervention until the patient has completed his initial treatment of chemotherapy and radiation for his large lung cancer.  I did discuss with the patient that if he develops worsening symptoms at any point, and especially if he develops an open wound or infection that he needs to contact me so that we can consider attempts at percutaneous revascularization of his occluded left iliac.  I have scheduled the patient for follow-up with me in 2 months for repeat evaluation    [name], MD [facility] [phone] Pager [phone]

## 2016-02-29 ENCOUNTER — Telehealth: Payer: Self-pay | Admitting: Hematology

## 2016-02-29 ENCOUNTER — Encounter: Payer: Self-pay | Admitting: *Deleted

## 2016-02-29 NOTE — Progress Notes (Signed)
Oncology Nurse Navigator Documentation  Oncology Nurse Navigator Flowsheets 02/29/2016  Navigator Location [facility]  Navigator Encounter Type Other/I followed up on [name] Chapman schedule.  I noticed he has his PET scan scheduled as well as radiation. I do not see follow up with Dr. Irene Limbo or chemo.  I notified Dr. Irene Limbo.    Treatment Phase Pre-Tx/Tx Discussion  Barriers/Navigation Needs Coordination of Care  Interventions Coordination of Care  Coordination of Care Appts  Acuity Level 2  Acuity Level 2 Assistance expediting appointments  Time Spent with Patient 30

## 2016-02-29 NOTE — Progress Notes (Signed)
Oncology Nurse Navigator Documentation  Oncology Nurse Navigator Flowsheets 02/29/2016  Navigator Location [facility]  Navigator Encounter Type Other/per Dr. Irene Limbo, he can see patient after his PET scan on 03/02/16 at 10:15.  I notified scheduling to call and schedule and updated his desk nurse.   Treatment Phase Pre-Tx/Tx Discussion  Barriers/Navigation Needs Coordination of Care  Interventions Coordination of Care  Coordination of Care Appts  Acuity Level 2  Acuity Level 2 Assistance expediting appointments  Time Spent with Patient 15

## 2016-02-29 NOTE — Telephone Encounter (Signed)
lvm to inform pt of 12/20 appt date/time per LOS

## 2016-03-01 ENCOUNTER — Telehealth: Payer: Self-pay | Admitting: *Deleted

## 2016-03-01 DIAGNOSIS — Z510 Encounter for antineoplastic radiation therapy: Secondary | ICD-10-CM | POA: Diagnosis not present

## 2016-03-01 NOTE — Telephone Encounter (Signed)
Oncology Nurse Navigator Documentation  Oncology Nurse Navigator Flowsheets 03/01/2016  Navigator Location [facility]  Navigator Encounter Type Telephone/I followed up on patient's schedule. He has a very busy treatment schedule.  I called to see if he had an questions about upcoming appts.  I left my name and phone number to call if needed.   Telephone Outgoing Call  Treatment Phase Pre-Tx/Tx Discussion  Barriers/Navigation Needs Education  Education Other  Acuity Level 1  Time Spent with Patient 15

## 2016-03-02 ENCOUNTER — Ambulatory Visit: Payer: Self-pay | Admitting: Nutrition

## 2016-03-02 ENCOUNTER — Encounter: Payer: Self-pay | Admitting: Radiation Oncology

## 2016-03-02 ENCOUNTER — Ambulatory Visit (HOSPITAL_COMMUNITY)
Admission: RE | Admit: 2016-03-02 | Discharge: 2016-03-02 | Disposition: A | Discharge status: Home / Self Care | Payer: Medicaid Other | Source: Ambulatory Visit | Attending: Hematology | Admitting: Hematology

## 2016-03-02 ENCOUNTER — Ambulatory Visit (HOSPITAL_BASED_OUTPATIENT_CLINIC_OR_DEPARTMENT_OTHER): Payer: Medicaid Other | Admitting: Hematology

## 2016-03-02 VITALS — BP 105/73 | HR 111 | Temp 100.0°F | Resp 18 | Wt 130.0 lb

## 2016-03-02 DIAGNOSIS — D649 Anemia, unspecified: Secondary | ICD-10-CM

## 2016-03-02 DIAGNOSIS — I313 Pericardial effusion (noninflammatory): Secondary | ICD-10-CM | POA: Insufficient documentation

## 2016-03-02 DIAGNOSIS — C3431 Malignant neoplasm of lower lobe, right bronchus or lung: Secondary | ICD-10-CM | POA: Diagnosis not present

## 2016-03-02 DIAGNOSIS — C3491 Malignant neoplasm of unspecified part of right bronchus or lung: Secondary | ICD-10-CM | POA: Diagnosis not present

## 2016-03-02 LAB — GLUCOSE, CAPILLARY: Glucose-Capillary: 94 mg/dL (ref 65–99)

## 2016-03-02 MED ORDER — FLUDEOXYGLUCOSE F - 18 (FDG) INJECTION: 6.4000 | Freq: Once | INTRAVENOUS | Status: DC | PRN

## 2016-03-02 NOTE — Patient Instructions (Signed)
Take multi-vitamin 1 tab po daily

## 2016-03-02 NOTE — Progress Notes (Signed)
55 year old male diagnosed with lung cancer.  He is a patient of Dr. Irene Limbo and Dr. Tammi Klippel.  Past medical history includes tobacco.  Medications include Pepcid, multivitamin.  Labs include sodium 134, creatinine 0.6, and albumin less than 2.0 on December 7.  Height: 66 inches. Weight: 131 pounds December 15. Usual body weight: 136 pounds November 24. BMI: 21.14.  Patient cannot recall usual body weight. Reports he has never eaten very much. States he is now drinking Ensure Plus twice a day. Eats a small lunch and supper but does not normally snack. Patient denies nausea, vomiting, constipation or diarrhea.  Nutrition diagnosis:  Food and nutrition related knowledge deficit related to lung cancer and associated treatments as evidenced by no prior need for nutrition related information.  Intervention: Patient educated to consume high-calorie high-protein foods in small frequent meals and snacks. Recommended patient consume Ensure Plus or equivalent 2-3 times a day between meals. Provided one complementary case of Ensure Plus. Provided fact sheets on increasing calories and protein. Questions were answered.  Teach back method used.  Contact information was given.  Monitoring, evaluation, goals: Patient will tolerate increased calories and protein to minimize weight loss throughout treatment.  Next visit: Scheduled as needed.  **Disclaimer: This note was dictated with voice recognition software. Similar sounding words can inadvertently be transcribed and this note may contain transcription errors which may not have been corrected upon publication of note.**

## 2016-03-03 ENCOUNTER — Ambulatory Visit
Admission: RE | Admit: 2016-03-03 | Discharge: 2016-03-03 | Disposition: A | Discharge status: Home / Self Care | Payer: Medicaid Other | Source: Ambulatory Visit | Attending: Radiation Oncology | Admitting: Radiation Oncology

## 2016-03-03 ENCOUNTER — Ambulatory Visit: Payer: Medicaid Other | Admitting: Radiation Oncology

## 2016-03-03 DIAGNOSIS — Z510 Encounter for antineoplastic radiation therapy: Secondary | ICD-10-CM | POA: Diagnosis not present

## 2016-03-04 ENCOUNTER — Ambulatory Visit
Admission: RE | Admit: 2016-03-04 | Discharge: 2016-03-04 | Disposition: A | Discharge status: Home / Self Care | Payer: Medicaid Other | Source: Ambulatory Visit | Attending: Radiation Oncology | Admitting: Radiation Oncology

## 2016-03-04 ENCOUNTER — Telehealth: Payer: Self-pay | Admitting: *Deleted

## 2016-03-04 ENCOUNTER — Ambulatory Visit: Payer: Medicaid Other

## 2016-03-04 ENCOUNTER — Ambulatory Visit
Admission: RE | Admit: 2016-03-04 | Discharge: 2016-03-04 | Disposition: A | Discharge status: Home / Self Care | Payer: Self-pay | Source: Ambulatory Visit | Attending: Radiation Oncology | Admitting: Radiation Oncology

## 2016-03-04 VITALS — BP 102/73 | HR 97 | Resp 18 | Wt 130.2 lb

## 2016-03-04 DIAGNOSIS — C3411 Malignant neoplasm of upper lobe, right bronchus or lung: Secondary | ICD-10-CM

## 2016-03-04 DIAGNOSIS — Z510 Encounter for antineoplastic radiation therapy: Secondary | ICD-10-CM | POA: Diagnosis not present

## 2016-03-04 MED ORDER — FAMOTIDINE 20 MG PO TABS: 20.0000 mg | ORAL_TABLET | Freq: Every day | ORAL | 6 refills | Status: DC

## 2016-03-04 NOTE — Progress Notes (Signed)
Weight and vitals stable. Denies pain. Reports he now drinks three cans of ensure per day. Reports he has not resumed smoking but, continues to wear nicotine patches. Reports a dry cough only at night. Denies shortness of breath. Denies dyspnea or chest pain. Reports dry mouth. Reports foul smelling urine.        BP 102/73 (BP Location: Right Arm, Patient Position: Sitting, Cuff Size: Normal)   Pulse 97   Resp 18   Wt 130 lb 3.2 oz (59.1 kg)   BMI 21.01 kg/m  Wt Readings from Last 3 Encounters:  03/04/16 130 lb 3.2 oz (59.1 kg)  03/02/16 130 lb (59 kg)  02/26/16 131 lb (59.4 kg)

## 2016-03-04 NOTE — Progress Notes (Signed)
  Radiation Oncology         [phone]   Name: [name] Chapman MRN: [id number]   Date: 03/04/2016  DOB: Sep 11, 1960   Weekly Radiation Therapy Management    ICD-9-CM ICD-10-CM   1. Malignant neoplasm of upper lobe of right lung (HCC) 162.3 C34.11     Current Dose: 4 Gy  Planned Dose:  66 Gy  Narrative The patient presents for routine under treatment assessment.  Weight and vitals stable. Denies pain. The patient reports he now drinks 3 cans of Ensure a day. He reports he has not resumed smoking, but he continues to wear nicotine patches. He reports dry mouth. The patient has a dry cough only at night. He denies shortness of breath. Denies dyspnea or chest pain. He reports foul smelling urine.  The patient is without complaint. Set-up films were reviewed. The chart was checked.  Physical Findings  weight is 130 lb 3.2 oz (59.1 kg). His blood pressure is 102/73 and his pulse is 97. His respiration is 18.  Weight essentially stable.  No significant changes.  Impression The patient is tolerating radiation.  Plan Continue treatment as planned.         Sheral Apley Tammi Klippel, M.D.  This document serves as a record of services personally performed by [name], MD. It was created on his behalf by Maryla Morrow, a trained medical scribe. The creation of this record is based on the scribe's personal observations and the provider's statements to them. This document has been checked and approved by the attending provider.

## 2016-03-04 NOTE — Telephone Encounter (Signed)
"  [name] Chapman has appointment at 12:00, 1:00 and 2:15 today.  What time is he to come in? 2:15 pm is the first appointment today with Radiation. "We're on our way."

## 2016-03-08 ENCOUNTER — Ambulatory Visit: Payer: Medicaid Other

## 2016-03-08 ENCOUNTER — Ambulatory Visit
Admission: RE | Admit: 2016-03-08 | Discharge: 2016-03-08 | Disposition: A | Discharge status: Home / Self Care | Payer: Medicaid Other | Source: Ambulatory Visit | Attending: Radiation Oncology | Admitting: Radiation Oncology

## 2016-03-08 DIAGNOSIS — Z510 Encounter for antineoplastic radiation therapy: Secondary | ICD-10-CM | POA: Diagnosis not present

## 2016-03-08 NOTE — Progress Notes (Signed)
[name]    HEMATOLOGY/ONCOLOGY CONSULTATION NOTE  Date of Service: .03/02/2016  Patient Care Team: [name], MD as PCP - General (Family Medicine)  CHIEF COMPLAINTS/PURPOSE OF CONSULTATION:   followup for newly diagnosed lung adenocarcinoma for stagng imaging  HISTORY OF PRESENTING ILLNESS:  [name] Chapman is a 55 y.o. male who has been referred to [place] by Dr Evalee Mutton Kristeen Mans, MD for evaluation and management of newly noted Lung mass and anemia.  Patient has had no significant medical followup and no known medical history who presented with worsening LLE pain for about 1 week. He has a h/o smoking 1PPD.  He noted a productive cough and 25-30lbs weight loss in the last 2-3 months. CXR showed a Rt lung mass.  Patient had a CTA of the chest/abd/pelvis which showed 9 x 1 x 13.2 x 13.9 cm RIGHT lung mass contiguous with the hilum compatible with neoplasm, primary lung versus lymphoma. Mediastinal lymphadenopathy. RIGHT lower lobe probable postobstructive pneumonia. Small RIGHT pleural effusion. 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 Reconstitution distal LEFT internal external iliac arteries. Mild atherosclerosis. Acute to subacute nondisplaced RIGHT L3 and L4 transverse process fractures appear traumatic, not pathologic.  Vascular surgery involved and recommended outpatient workup. He was admitted to expedite his w/u for newly diagnosed lung mass. He was noted to have microcytic anemia with hgb down to 7 with nl ferritin and neg FOBT suggesting likely anemia of chronic disease from his malignancy.  Patient subsequently had a bronchoscopic biopsy by Dr. Lamonte Sakai which was consistent with an adenocarcinoma of the lung. Patient needed IV antibiotics for treatment of postobstructive pneumonia.  He has a follow-up with vascular surgery Dr Aleen Campi in clinic for management of his peripheral arterial disease.    Patient comes for follow-up in clinic after being recently  discharged from the hospital. He notes his breathing is stable though he still has a cough. His left foot feels numb and cold similar to that he was in the hospital. He continues to be on aspirin at this time. Is having some low-grade fevers at home. Eating and drinking well. Notes that he is in the process of applying for Social Security disability. He was given our Education officer, museum scars in clinic if he needed any help with this. We discussed the need for PRBC transfusion given his significant anemia and he was agreeable to this. His foundation 1 results are still pending at this time. No hemoptysis.    INTERVAL HISTORY  Patient is here for his scheduled followup after PET/CT. 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 He is getting palliative RT to his lung mass due to concerns with bronchial obstruction. Notes that he is feeling better after his PRBC transfusions. Borderline po intake.  MEDICAL HISTORY:  Past Medical History:  Diagnosis Date  . 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 VIDEO BRONCHOSCOPY Bilateral 01/29/2016   Procedure: VIDEO BRONCHOSCOPY WITH FLUORO;  Surgeon: Collene Gobble, MD;  Location: [facility];  Service: Cardiopulmonary;  Laterality: Bilateral;    SOCIAL HISTORY: Social History   Social History  . Marital status: Single    Spouse name: N/A  . Number of children: N/A  . Years of education: N/A   Occupational History  . Not on file.   Social History Main Topics  . Smoking status: Former Smoker  Packs/day: 1.00    Years: 30.00    Types: Cigarettes    Quit date: 01/21/2016  . Smokeless tobacco: Never Used  . Alcohol use No     Comment: reports he used to drink approximately six beers per week but, stopped since diagnosis.  . Drug use: No  . Sexual activity: Not Currently   Other Topics Concern  . Not  on file   Social History Narrative  . No narrative on file    FAMILY HISTORY: Family History  Problem Relation Age of Onset  . CAD Father   . Cancer Neg Hx     ALLERGIES:  has No Known Allergies.  MEDICATIONS:  Current Outpatient Prescriptions  Medication Sig Dispense Refill  . acetaminophen (TYLENOL) 325 MG tablet Take 650 mg by mouth every 6 (six) hours as needed.    [name] aspirin EC 81 MG tablet Take 1 tablet (81 mg total) by mouth daily. 30 tablet 0  . famotidine (PEPCID) 20 MG tablet Take 1 tablet (20 mg total) by mouth daily. 30 tablet 6  . feeding supplement, ENSURE ENLIVE, (ENSURE ENLIVE) LIQD Take 237 mLs by mouth 2 (two) times daily between meals. 60 Bottle 0  . Multiple Vitamin (MULTIVITAMIN WITH MINERALS) TABS tablet Take 1 tablet by mouth daily. 30 tablet 0  . nicotine (NICODERM CQ - DOSED IN MG/24 HOURS) 14 mg/24hr patch Place 1 patch (14 mg total) onto the skin at bedtime. 28 patch 0   No current facility-administered medications for this visit.     REVIEW OF SYSTEMS:    10 Point review of Systems was done is negative except as noted above.  PHYSICAL EXAMINATION: ECOG PERFORMANCE STATUS: 2 - Symptomatic, <50% confined to bed  . Vitals:   03/02/16 1047  BP: 105/73  Pulse: (!) 111  Resp: 18  Temp: 100 F (37.8 C)   Filed Weights   03/02/16 1047  Weight: 130 lb (59 kg)   .Body mass index is 20.98 kg/m.  GENERAL:alert, in no acute distress and comfortable SKIN: skin color, texture, turgor are normal, no rashes or significant lesions EYES: normal, conjunctiva are pink and non-injected, sclera clear OROPHARYNX:no exudate, no erythema and lips, buccal mucosa, and tongue normal  NECK: supple, no JVD, thyroid normal size, non-tender, without nodularity LYMPH:  no palpable lymphadenopathy in the cervical, axillary or inguinal LUNGS: Decreased air entry at right lung with a few scattered rhonchi HEART: regular rate & rhythm,  no murmurs and no lower extremity  edema ABDOMEN: abdomen soft, non-tender, normoactive bowel sounds  Musculoskeletal: Left foot somewhat colder than the right foot PSYCH: alert & oriented x 3 with fluent speech NEURO: no focal motor/sensory deficits  LABORATORY DATA:  I have reviewed the data as listed  . CBC Latest Ref Rng & Units 02/18/2016 02/16/2016 02/04/2016  WBC 4.0 - 10.3 10e3/uL 21.6(H) 23.0(H) 18.5(H)  Hemoglobin 13.0 - 17.1 g/dL 7.4(L) 8.1(L) 7.6(L)  Hematocrit 38.4 - 49.9 % 24.3(L) 27.6(L) 25.0(L)  Platelets 140 - 400 10e3/uL 577(H) 694(H) 651(H)    . CMP Latest Ref Rng & Units 02/18/2016 02/16/2016 02/03/2016  Glucose 70 - 140 mg/dl 86 100(H) 108(H)  BUN 7.0 - 26.0 mg/dL 10.'5 11 9  ' Creatinine 0.7 - 1.3 mg/dL 0.6(L) 0.59(L) 0.60(L)  Sodium 136 - 145 mEq/L 134(L) 132(L) 134(L)  Potassium 3.5 - 5.1 mEq/L 4.8 4.6 3.7  Chloride 98 - 110 mmol/L - 99 102  CO2 22 - 29 mEq/L 21(L) 21 24  Calcium 8.4 - 10.4 mg/dL  9.3 8.6 8.6(L)  Total Protein 6.4 - 8.3 g/dL 8.5(H) - -  Total Bilirubin 0.20 - 1.20 mg/dL 0.46 - -  Alkaline Phos 40 - 150 U/L 828(H) - -  AST 5 - 34 U/L 35(H) - -  ALT 0 - 55 U/L 21 - -   EGFR mutation negative.  Component     Latest Ref Rng & Units 02/05/2016  Hepatitis B Surface Ag     Negative Negative  HCV Ab     0.0 - 0.9 s/co ratio <0.1  Hep A Ab, IgM     Negative Negative  Hep B Core Ab, IgM     Negative Negative  HIV     Non Reactive Non Reactive  CRP     <1.0 mg/dL 19.1 (H)  Sed Rate     0 - 16 mm/hr 140 (H)  RA Latex Turbid.     0.0 - 13.9 IU/mL 74.6 (H)       RADIOGRAPHIC STUDIES: I have personally reviewed the radiological images as listed and agreed with the findings in the report. Nm Pet Image Initial (pi) Skull Base To Thigh  Result Date: 03/02/2016 CLINICAL DATA:  Initial treatment strategy for lung cancer. EXAM: NUCLEAR MEDICINE PET SKULL BASE TO THIGH TECHNIQUE: 6.4 mCi F-18 FDG was injected intravenously. Full-ring PET imaging was performed from the skull  base to thigh after the radiotracer. CT data was obtained and used for attenuation correction and anatomic localization. FASTING BLOOD GLUCOSE:  Value: 94 mg/dl COMPARISON:  None FINDINGS: NECK No hypermetabolic lymph nodes in the neck. CHEST Mild cardiac enlargement. There is a small pericardial effusion identified. Aortic atherosclerosis is identified. Calcification within the coronary arteries noted. The trachea appears patent and is midline. Unremarkable appearance of the esophagus. Hypermetabolic anterior mediastinal, right paratracheal, sub- carinal and right hilar adenopathy identified. The index right paratracheal lymph node measures 1.3 cm and has an SUV max equal to 12.4. The index sub- carinal lymph node measures 2.3 cm and has an SUV max equal to 12.4. Hypermetabolic right hilar node measures 1.9 cm and has an SUV max equal to 11.3. Small right pleural effusion. Large centrally necrotic mass with rim of peripheral hypermetabolic soft tissue is identified involving the right lower lobe. This measures 12.9 cm and has an SUV max equal to 20.29. There is surrounding interstitial thickening within the right lower lobe which is favored to represent lymphangitic spread of tumor. Pulmonary nodules are identified in both lungs. Index nodule within the anteromedial left upper lobe measures 1 cm and has an SUV max equal to 6.9 index nodule within the posterior left upper lobe measures 0.9 cm and has an SUV max equal to 5.37. There is a 7 mm nodule in the right middle lobe which is too small to reliably characterize. Increased radiotracer uptake throughout the axial skeleton is identified. On the corresponding CT images no aggressive lytic or sclerotic bone lesions identified. ABDOMEN/PELVIS No abnormal hypermetabolic activity within the liver, pancreas, adrenal glands, or spleen. No hypermetabolic lymph nodes in the abdomen or pelvis. SKELETON No focal hypermetabolic activity to suggest skeletal metastasis.  IMPRESSION: 1. Intense radiotracer uptake is associated with the large necrotic mass involving the right lower lobe. There is associated mediastinal and hilar adenopathy as well as multifocal pulmonary nodules compatible with metastatic disease. Assuming non-small cell histology imaging [id number] or Stage 4 disease. 2. Pericardial effusion Electronically Signed   By: Kerby Moors M.D.   On: 03/02/2016 12:38    ASSESSMENT &  PLAN:    1) Stage IV ([id number]) Rt Lung large lung biopsy proven lung adenocarcinoma with  concern for pleural thickening and pleural involvement, MRI brain neg for brain mets. EGFR, ALK, ROS-1, BRAF neg PDL1 status- pending PET/CT results noted 2) s/p Post-obstructive pneumonia 3) Emphysema PLAN -PDL1 testing results pending --palliative RT to rt lung mass for bronchial obstruction --palliative systemic therapies for metastatic lung adenocarcinoma after completion of palliative RT -- platinum doublet vs PD1 inhibitor based on PDL1 status and PS.  4) Microcytic Anemia  Likely predominantly anemia of chronic disease Significantly elevated inflammatory markers. Also has elevated rheumatoid factor levels ?RA. No overt focal joint symptoms at this time.  . RecentLabs       Lab Results  Component Value Date   IRON 7 (L) 01/28/2016   TIBC 176 (L) 01/28/2016   IRONPCTSAT 4 (L) 01/28/2016     (Iron and TIBC)  RecentLabs       Lab Results  Component Value Date   FERRITIN 651 (H) 01/28/2016    Plan -transfuse prn to maintain Hgb close to 9 in the setting of upcoming treatment and limb ischemia symptoms.  5) Left common iliac artery occlusion. 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 -likely related to PAD due to smoking. -outpatient f/u with vascular surgery - on ASA per their recommendation. No intervention planned. -Counseled on smoking cessation  6) Compression acute vs subacute fracture of rt L3 and L4 transverse processes  -pain mx. Pain is currently  controlled   7) Smoking -counseled on importance of smoking cessation.  RTC with Dr Irene Limbo in 2 weeks with labs -MSW consultation for transportation needs  All of the patients questions were answered with apparent satisfaction. The patient knows to call the clinic with any problems, questions or concerns.  I spent 25 minutes counseling the patient face to face. The total time spent in the appointment was 30 minutes and more than 50% was on counseling and direct patient cares.    Sullivan Lone MD [place] AAHIVMS [facility] Hematology/Oncology Physician [facility]  (Office):       [phone] (Work cell):  [phone] (Fax):           [phone]

## 2016-03-09 ENCOUNTER — Ambulatory Visit
Admission: RE | Admit: 2016-03-09 | Discharge: 2016-03-09 | Disposition: A | Discharge status: Home / Self Care | Payer: Medicaid Other | Source: Ambulatory Visit | Attending: Radiation Oncology | Admitting: Radiation Oncology

## 2016-03-09 ENCOUNTER — Ambulatory Visit: Payer: Medicaid Other

## 2016-03-09 DIAGNOSIS — Z510 Encounter for antineoplastic radiation therapy: Secondary | ICD-10-CM | POA: Diagnosis not present

## 2016-03-10 ENCOUNTER — Ambulatory Visit
Admission: RE | Admit: 2016-03-10 | Discharge: 2016-03-10 | Disposition: A | Discharge status: Home / Self Care | Payer: Medicaid Other | Source: Ambulatory Visit | Attending: Radiation Oncology | Admitting: Radiation Oncology

## 2016-03-10 ENCOUNTER — Ambulatory Visit: Payer: Medicaid Other

## 2016-03-10 ENCOUNTER — Encounter: Payer: Self-pay | Admitting: *Deleted

## 2016-03-10 DIAGNOSIS — Z510 Encounter for antineoplastic radiation therapy: Secondary | ICD-10-CM | POA: Diagnosis not present

## 2016-03-10 NOTE — Progress Notes (Signed)
[place] Work  Clinical Social Work was referred by PA  for assessment of psychosocial needs.  Clinical Social Worker has made several attempts to contact patient at home to offer support and assess for needs. CSW made referral to financial advocate team one week ago as well. CSW was able to connect with pt this am via phone and pt reports his biggest concern is affording his medications due to [facility] pending for him. CSW educated pt about the grant and how he needs to meet with financial advocates for assistance. CSW made additional referral today. Pt has been able to get to appointments, but could greatly benefit from assistance through the grant for gas cards. CSW reviewed additional options for transportation assistance. Pt feels his family and friends are working well currently. Pt aware to reach out as needed for further assistance. CSW team to follow and assist.     Clinical Social Work interventions: Set designer education and referral Supportive listening  Loren Racer, [place]  [place] Phone: [phone] Fax: [phone]

## 2016-03-11 ENCOUNTER — Ambulatory Visit
Admission: RE | Admit: 2016-03-11 | Discharge: 2016-03-11 | Disposition: A | Discharge status: Home / Self Care | Payer: Medicaid Other | Source: Ambulatory Visit | Attending: Radiation Oncology | Admitting: Radiation Oncology

## 2016-03-11 ENCOUNTER — Ambulatory Visit
Admission: RE | Admit: 2016-03-11 | Discharge: 2016-03-11 | Disposition: A | Discharge status: Home / Self Care | Payer: Self-pay | Source: Ambulatory Visit | Attending: Radiation Oncology | Admitting: Radiation Oncology

## 2016-03-11 ENCOUNTER — Ambulatory Visit: Payer: Medicaid Other

## 2016-03-11 VITALS — BP 105/63 | HR 116 | Temp 97.8°F | Resp 12 | Wt 130.1 lb

## 2016-03-11 DIAGNOSIS — Z510 Encounter for antineoplastic radiation therapy: Secondary | ICD-10-CM | POA: Diagnosis not present

## 2016-03-11 DIAGNOSIS — C3411 Malignant neoplasm of upper lobe, right bronchus or lung: Secondary | ICD-10-CM

## 2016-03-11 NOTE — Progress Notes (Signed)
PAIN: He is currently in no pain.  RESPIRATORY: Coughing  Dry. Pt is on room air. Continues to apply Radiaplex as directed.  Reports Dry mouth.  SWALLOWING/DIET: Pt denies dysphagia. OTHER: Pt complains of fatigue and poor appetite. BP 105/63   Pulse (!) 116   Temp 97.8 F (36.6 C) (Oral)   Resp 12   Wt 130 lb 1.6 oz (59 kg)   SpO2 100%   BMI 21.00 kg/m  Wt Readings from Last 3 Encounters:  03/11/16 130 lb 1.6 oz (59 kg)  03/04/16 130 lb 3.2 oz (59.1 kg)  03/02/16 130 lb (59 kg)

## 2016-03-11 NOTE — Progress Notes (Signed)
  Radiation Oncology         [phone]   Name: [name] Chapman MRN: [id number]   Date: 03/11/2016  DOB: 1960-08-19   Weekly Radiation Therapy Management    ICD-9-CM ICD-10-CM   1. Malignant neoplasm of upper lobe of right lung (HCC) 162.3 C34.11     Current Dose: 12 Gy  Planned Dose:  66 Gy  Narrative The patient presents for routine under treatment assessment.  Weight and vitals stable. The patient denies pain. He reports some fatigue. He has a dry cough. He also reports dry mouth and a poor appetite. The patient denies dysphagia. The patient is accompanied by his wife today.  The patient is without complaint. Set-up films were reviewed. The chart was checked.  Physical Findings  weight is 130 lb 1.6 oz (59 kg). His oral temperature is 97.8 F (36.6 C). His blood pressure is 105/63 and his pulse is 116 (abnormal). His respiration is 12 and oxygen saturation is 100%.  Weight essentially stable.  No significant changes.   Impression The patient is tolerating radiation.  Plan Continue treatment as planned.        ------------------------------------------------  Jodelle Gross, MD, PhD  This document serves as a record of services personally performed by Kyung Rudd, MD. It was created on his behalf by Maryla Morrow, a trained medical scribe. The creation of this record is based on the scribe's personal observations and the provider's statements to them. This document has been checked and approved by the attending provider.

## 2016-03-15 ENCOUNTER — Other Ambulatory Visit (HOSPITAL_BASED_OUTPATIENT_CLINIC_OR_DEPARTMENT_OTHER): Payer: Medicaid Other

## 2016-03-15 ENCOUNTER — Ambulatory Visit
Admission: RE | Admit: 2016-03-15 | Discharge: 2016-03-15 | Disposition: A | Discharge status: Home / Self Care | Payer: Medicaid Other | Source: Ambulatory Visit | Attending: Radiation Oncology | Admitting: Radiation Oncology

## 2016-03-15 ENCOUNTER — Ambulatory Visit: Payer: Medicaid Other

## 2016-03-15 ENCOUNTER — Telehealth: Payer: Self-pay | Admitting: *Deleted

## 2016-03-15 DIAGNOSIS — D649 Anemia, unspecified: Secondary | ICD-10-CM | POA: Diagnosis not present

## 2016-03-15 DIAGNOSIS — C3491 Malignant neoplasm of unspecified part of right bronchus or lung: Secondary | ICD-10-CM | POA: Diagnosis present

## 2016-03-15 DIAGNOSIS — Z510 Encounter for antineoplastic radiation therapy: Secondary | ICD-10-CM | POA: Diagnosis not present

## 2016-03-15 LAB — COMPREHENSIVE METABOLIC PANEL
ALK PHOS: 663 U/L — AB (ref 40–150)
ALT: 17 U/L (ref 0–55)
ANION GAP: 11 meq/L (ref 3–11)
AST: 28 U/L (ref 5–34)
BUN: 10.7 mg/dL (ref 7.0–26.0)
CALCIUM: 9 mg/dL (ref 8.4–10.4)
CHLORIDE: 102 meq/L (ref 98–109)
CO2: 20 mEq/L — ABNORMAL LOW (ref 22–29)
CREATININE: 0.6 mg/dL — AB (ref 0.7–1.3)
EGFR: >90 mL/min/{1.73_m2} (ref >90)
Glucose: 103 mg/dl (ref 70–140)
POTASSIUM: 3.5 meq/L (ref 3.5–5.1)
Sodium: 134 mEq/L — ABNORMAL LOW (ref 136–145)
Total Bilirubin: 0.35 mg/dL (ref 0.20–1.20)
Total Protein: 8.3 g/dL (ref 6.4–8.3)

## 2016-03-15 LAB — CBC & DIFF AND RETIC
BASO%: 0.2 % (ref 0.0–2.0)
BASOS ABS: 0 10*3/uL (ref 0.0–0.1)
EOS%: 1.1 % (ref 0.0–7.0)
Eosinophils Absolute: 0.1 10*3/uL (ref 0.0–0.5)
HEMATOCRIT: 26.5 % — AB (ref 38.4–49.9)
HEMOGLOBIN: 8 g/dL — AB (ref 13.0–17.1)
Immature Retic Fract: 19.2 % — ABNORMAL HIGH (ref 3.00–10.60)
LYMPH%: 9 % — AB (ref 14.0–49.0)
MCH: 22 pg — AB (ref 27.2–33.4)
MCHC: 30.2 g/dL — AB (ref 32.0–36.0)
MCV: 72.8 fL — ABNORMAL LOW (ref 79.3–98.0)
MONO#: 0.9 10*3/uL (ref 0.1–0.9)
MONO%: 8.3 % (ref 0.0–14.0)
NEUT#: 8.5 10*3/uL — ABNORMAL HIGH (ref 1.5–6.5)
NEUT%: 81.4 % — ABNORMAL HIGH (ref 39.0–75.0)
PLATELETS: 467 10*3/uL — AB (ref 140–400)
RBC: 3.64 10*6/uL — ABNORMAL LOW (ref 4.20–5.82)
RDW: 23.5 % — ABNORMAL HIGH (ref 11.0–14.6)
Retic %: 0.71 % — ABNORMAL LOW (ref 0.80–1.80)
Retic Ct Abs: 25.84 10*3/uL — ABNORMAL LOW (ref 34.80–93.90)
WBC: 10.5 10*3/uL — ABNORMAL HIGH (ref 4.0–10.3)
lymph#: 1 10*3/uL (ref 0.9–3.3)
nRBC: 0 % (ref 0–0)

## 2016-03-15 LAB — FERRITIN

## 2016-03-15 LAB — SAMPLE TO BLOOD BANK

## 2016-03-15 LAB — IRON AND TIBC
%SAT: 14 % — AB (ref 20–55)
IRON: 19 ug/dL — AB (ref 42–163)
TIBC: 134 ug/dL — ABNORMAL LOW (ref 202–409)
UIBC: 115 ug/dL — ABNORMAL LOW (ref 117–376)

## 2016-03-15 NOTE — Telephone Encounter (Signed)
LVM with patient regarding hemoglobin of 8.0.  Instructed pt to call back to inform us if he is feeling lethargic and may be interested in blood transfusion.

## 2016-03-16 ENCOUNTER — Ambulatory Visit: Payer: Medicaid Other

## 2016-03-16 ENCOUNTER — Ambulatory Visit
Admission: RE | Admit: 2016-03-16 | Discharge: 2016-03-16 | Disposition: A | Discharge status: Home / Self Care | Payer: Medicaid Other | Source: Ambulatory Visit | Attending: Radiation Oncology | Admitting: Radiation Oncology

## 2016-03-16 DIAGNOSIS — Z510 Encounter for antineoplastic radiation therapy: Secondary | ICD-10-CM | POA: Diagnosis not present

## 2016-03-17 ENCOUNTER — Telehealth: Payer: Self-pay | Admitting: *Deleted

## 2016-03-17 ENCOUNTER — Encounter: Payer: Self-pay | Admitting: *Deleted

## 2016-03-17 ENCOUNTER — Ambulatory Visit: Payer: Medicaid Other

## 2016-03-17 ENCOUNTER — Ambulatory Visit
Admission: RE | Admit: 2016-03-17 | Discharge: 2016-03-17 | Disposition: A | Discharge status: Home / Self Care | Payer: Medicaid Other | Source: Ambulatory Visit | Attending: Radiation Oncology | Admitting: Radiation Oncology

## 2016-03-17 ENCOUNTER — Other Ambulatory Visit: Payer: Self-pay | Admitting: Hematology

## 2016-03-17 ENCOUNTER — Ambulatory Visit: Admission: RE | Admit: 2016-03-17 | Payer: Self-pay | Source: Ambulatory Visit | Admitting: Radiation Oncology

## 2016-03-17 ENCOUNTER — Other Ambulatory Visit: Payer: Self-pay

## 2016-03-17 DIAGNOSIS — Z510 Encounter for antineoplastic radiation therapy: Secondary | ICD-10-CM | POA: Diagnosis not present

## 2016-03-17 NOTE — Telephone Encounter (Signed)
"  We're waiting in the lobby for a 12:30 pm.  Is there an earlier class."   At 10:51 someone in lobby helping them.  thanked this nurse and call ended.

## 2016-03-18 ENCOUNTER — Ambulatory Visit
Admission: RE | Admit: 2016-03-18 | Discharge: 2016-03-18 | Disposition: A | Discharge status: Home / Self Care | Payer: Medicaid Other | Source: Ambulatory Visit | Attending: Radiation Oncology | Admitting: Radiation Oncology

## 2016-03-18 ENCOUNTER — Ambulatory Visit (HOSPITAL_COMMUNITY)
Admission: RE | Admit: 2016-03-18 | Discharge: 2016-03-18 | Disposition: A | Discharge status: Home / Self Care | Payer: Medicaid Other | Source: Ambulatory Visit | Attending: Hematology | Admitting: Hematology

## 2016-03-18 ENCOUNTER — Other Ambulatory Visit: Payer: Self-pay | Admitting: *Deleted

## 2016-03-18 ENCOUNTER — Other Ambulatory Visit: Payer: Self-pay | Admitting: Medical Oncology

## 2016-03-18 ENCOUNTER — Encounter: Payer: Self-pay | Admitting: Hematology

## 2016-03-18 ENCOUNTER — Ambulatory Visit: Payer: Medicaid Other

## 2016-03-18 ENCOUNTER — Ambulatory Visit: Payer: Self-pay

## 2016-03-18 ENCOUNTER — Ambulatory Visit (HOSPITAL_BASED_OUTPATIENT_CLINIC_OR_DEPARTMENT_OTHER): Payer: Medicaid Other | Admitting: Hematology

## 2016-03-18 VITALS — BP 113/72 | HR 104 | Temp 97.7°F | Resp 18 | Ht 66.0 in | Wt 135.4 lb

## 2016-03-18 DIAGNOSIS — D649 Anemia, unspecified: Secondary | ICD-10-CM

## 2016-03-18 DIAGNOSIS — Z510 Encounter for antineoplastic radiation therapy: Secondary | ICD-10-CM | POA: Diagnosis not present

## 2016-03-18 DIAGNOSIS — R630 Anorexia: Secondary | ICD-10-CM | POA: Diagnosis not present

## 2016-03-18 DIAGNOSIS — C3432 Malignant neoplasm of lower lobe, left bronchus or lung: Secondary | ICD-10-CM | POA: Diagnosis present

## 2016-03-18 DIAGNOSIS — C3491 Malignant neoplasm of unspecified part of right bronchus or lung: Secondary | ICD-10-CM | POA: Diagnosis present

## 2016-03-18 DIAGNOSIS — Z720 Tobacco use: Secondary | ICD-10-CM | POA: Diagnosis not present

## 2016-03-18 LAB — PREPARE RBC (CROSSMATCH)

## 2016-03-18 MED ORDER — ASPIRIN EC 81 MG PO TBEC: 81.0000 mg | DELAYED_RELEASE_TABLET | Freq: Every day | ORAL | 0 refills | Status: DC

## 2016-03-18 MED ORDER — FAMOTIDINE 20 MG PO TABS: 20.0000 mg | ORAL_TABLET | Freq: Every day | ORAL | 6 refills | Status: DC

## 2016-03-18 MED ORDER — NICOTINE 14 MG/24HR TD PT24: 14.0000 mg | MEDICATED_PATCH | Freq: Every day | TRANSDERMAL | 0 refills | Status: DC

## 2016-03-18 MED ORDER — ACETAMINOPHEN 325 MG PO TABS: 650.0000 mg | ORAL_TABLET | Freq: Four times a day (QID) | ORAL | 2 refills | Status: AC | PRN

## 2016-03-18 MED ORDER — DEXAMETHASONE 4 MG PO TABS: ORAL_TABLET | ORAL | 0 refills | Status: DC

## 2016-03-18 NOTE — Progress Notes (Signed)
Pt instructed to come back for PRBC infusion (2 units) tomorrow, 1/6 @ 0900.  Msg left for scheduler, pt sent back to lab for type and screen.  Pt instructed not to remove blood bank bracelet.  Pt verbalized understanding.   Per Dr. Irene Limbo, contacted pathology to see if PDL-1 testing has been sent.  Per Altamese Dilling at [facility] pathology, it had not been sent.  Stated that she would check with pathologist and have this sent, she would contact us only if testing could not be sent.  #[id number]

## 2016-03-19 ENCOUNTER — Ambulatory Visit: Payer: Self-pay

## 2016-03-19 DIAGNOSIS — C3491 Malignant neoplasm of unspecified part of right bronchus or lung: Secondary | ICD-10-CM | POA: Diagnosis not present

## 2016-03-19 DIAGNOSIS — D649 Anemia, unspecified: Secondary | ICD-10-CM

## 2016-03-19 MED ORDER — DIPHENHYDRAMINE HCL 25 MG PO CAPS
25.0000 mg | ORAL_CAPSULE | Freq: Once | ORAL | Status: AC
  Administered 2016-03-19: 25 mg via ORAL

## 2016-03-19 MED ORDER — DIPHENHYDRAMINE HCL 25 MG PO CAPS
ORAL_CAPSULE | ORAL | Status: AC
  Filled 2016-03-19: qty 1

## 2016-03-19 MED ORDER — SODIUM CHLORIDE 0.9 % IV SOLN
250.0000 mL | Freq: Once | INTRAVENOUS | Status: AC
  Administered 2016-03-19: 250 mL via INTRAVENOUS

## 2016-03-19 MED ORDER — ACETAMINOPHEN 325 MG PO TABS
ORAL_TABLET | ORAL | Status: AC
  Filled 2016-03-19: qty 2

## 2016-03-19 MED ORDER — ACETAMINOPHEN 325 MG PO TABS
650.0000 mg | ORAL_TABLET | Freq: Once | ORAL | Status: AC
  Administered 2016-03-19: 650 mg via ORAL

## 2016-03-19 NOTE — Patient Instructions (Signed)
Blood Transfusion , Adult A blood transfusion is a procedure in which you receive donated blood, including plasma, platelets, and red blood cells, through an IV tube. You may need a blood transfusion because of illness, surgery, or injury. The blood may come from a donor. You may also be able to donate blood for yourself (autologous blood donation) before a surgery if you know that you might require a blood transfusion. The blood given in a transfusion is made up of different types of cells. You may receive:  Red blood cells. These carry oxygen to the cells in the body.  White blood cells. These help you fight infections.  Platelets. These help your blood to clot.  Plasma. This is the liquid part of your blood and it helps with fluid imbalances. If you have hemophilia or another clotting disorder, you may also receive other types of blood products. Tell a health care provider about:  Any allergies you have.  All medicines you are taking, including vitamins, herbs, eye drops, creams, and over-the-counter medicines.  Any problems you or family members have had with anesthetic medicines.  Any blood disorders you have.  Any surgeries you have had.  Any medical conditions you have, including any recent fever or cold symptoms.  Whether you are pregnant or may be pregnant.  Any previous reactions you have had during a blood transfusion. What are the risks? Generally, this is a safe procedure. However, problems may occur, including:  Having an allergic reaction to something in the donated blood. Hives and itching may be symptoms of this type of reaction.  Fever. This may be a reaction to the white blood cells in the transfused blood. Nausea or chest pain may accompany a fever.  Iron overload. This can happen from having many transfusions.  Transfusion-related acute lung injury (TRALI). This is a rare reaction that causes lung damage. The cause is not known.TRALI can occur within hours  of a transfusion or several days later.  Sudden (acute) or delayed hemolytic reactions. This happens if your blood does not match the cells in your transfusion. Your body's defense system (immune system) may try to attack the new cells. This complication is rare. The symptoms include fever, chills, nausea, and low back pain or chest pain.  Infection or disease transmission. This is rare. What happens before the procedure?  You will have a blood test to determine your blood type. This is necessary to know what kind of blood your body will accept and to match it to the donor blood.  If you are going to have a planned surgery, you may be able to do an autologous blood donation. This may be done in case you need to have a transfusion.  If you have had an allergic reaction to a transfusion in the past, you may be given medicine to help prevent a reaction. This medicine may be given to you by mouth or through an IV tube.  You will have your temperature, blood pressure, and pulse monitored before the transfusion.  Follow instructions from your health care provider about eating and drinking restrictions.  Ask your health care provider about:  Changing or stopping your regular medicines. This is especially important if you are taking diabetes medicines or blood thinners.  Taking medicines such as aspirin and ibuprofen. These medicines can thin your blood. Do not take these medicines before your procedure if your health care provider instructs you not to. What happens during the procedure?  An IV tube will be   inserted into one of your veins.  The bag of donated blood will be attached to your IV tube. The blood will then enter through your vein.  Your temperature, blood pressure, and pulse will be monitored regularly during the transfusion. This monitoring is done to detect early signs of a transfusion reaction.  If you have any signs or symptoms of a reaction, your transfusion will be stopped and  you may be given medicine.  When the transfusion is complete, your IV tube will be removed.  Pressure may be applied to the IV site for a few minutes.  A bandage (dressing) will be applied. The procedure may vary among health care providers and hospitals. What happens after the procedure?  Your temperature, blood pressure, heart rate, breathing rate, and blood oxygen level will be monitored often.  Your blood may be tested to see how you are responding to the transfusion.  You may be warmed with fluids or blankets to maintain a normal body temperature. Summary  A blood transfusion is a procedure in which you receive donated blood, including plasma, platelets, and red blood cells, through an IV tube.  Your temperature, blood pressure, and pulse will be monitored before, during, and after the transfusion.  Your blood may be tested after the transfusion to see how your body has responded. This information is not intended to replace advice given to you by your health care provider. Make sure you discuss any questions you have with your health care provider. Document Released: 02/26/2000 Document Revised: 11/26/2015 Document Reviewed: 11/26/2015 Elsevier Interactive Patient Education  2017 Elsevier Inc.  

## 2016-03-19 NOTE — Progress Notes (Signed)
Pt tolerated infusion well, pt and VS stable at discharge.  

## 2016-03-21 ENCOUNTER — Ambulatory Visit: Payer: Medicaid Other

## 2016-03-21 ENCOUNTER — Ambulatory Visit
Admission: RE | Admit: 2016-03-21 | Discharge: 2016-03-21 | Disposition: A | Discharge status: Home / Self Care | Payer: Medicaid Other | Source: Ambulatory Visit | Attending: Radiation Oncology | Admitting: Radiation Oncology

## 2016-03-21 ENCOUNTER — Other Ambulatory Visit (HOSPITAL_COMMUNITY)
Admission: RE | Admit: 2016-03-21 | Discharge: 2016-03-21 | Disposition: A | Discharge status: Home / Self Care | Payer: Medicaid Other | Source: Ambulatory Visit | Attending: Hematology | Admitting: Hematology

## 2016-03-21 DIAGNOSIS — C3431 Malignant neoplasm of lower lobe, right bronchus or lung: Secondary | ICD-10-CM | POA: Insufficient documentation

## 2016-03-21 DIAGNOSIS — Z510 Encounter for antineoplastic radiation therapy: Secondary | ICD-10-CM | POA: Diagnosis not present

## 2016-03-21 LAB — TYPE AND SCREEN
BLOOD PRODUCT EXPIRATION DATE: 201801252359
Blood Product Expiration Date: 201801252359
ISSUE DATE / TIME: 201801060850
ISSUE DATE / TIME: 201801060850
Unit Type and Rh: 600
Unit Type and Rh: 9500

## 2016-03-21 NOTE — Progress Notes (Signed)
[name]    HEMATOLOGY/ONCOLOGY CONSULTATION NOTE  Date of Service: .03/18/2016  Patient Care Team: [name], MD as PCP - General (Family Medicine)  CHIEF COMPLAINTS/PURPOSE OF CONSULTATION:   followup for newly diagnosed lung adenocarcinoma for stagng imaging  HISTORY OF PRESENTING ILLNESS:  plz see previous note for details   INTERVAL HISTORY  Patient is here for his scheduled followup for his metastatic lung cancer. He is tolerating palliative RT well thus far and has no acute new symptoms. Still has left leg cramps/claudications especially with ambulation. No significant change in breathing. Notes easy eating better. No hemoptysis. Notes poor appetite. We'll start her on low-dose dexamethasone to help with appetite stimulation. Trying to work on smoking cessation. He is getting anemic again and was setup for PRBC transfusion for symptomatic anemia.  MEDICAL HISTORY:  Past Medical History:  Diagnosis Date  . 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 VIDEO BRONCHOSCOPY Bilateral 01/29/2016   Procedure: VIDEO BRONCHOSCOPY WITH FLUORO;  Surgeon: Collene Gobble, MD;  Location: [facility];  Service: Cardiopulmonary;  Laterality: Bilateral;    SOCIAL HISTORY: Social History   Social History  . Marital status: Single    Spouse name: N/A  . Number of children: N/A  . Years of education: N/A   Occupational History  . Not on file.   Social History Main Topics  . Smoking status: Former Smoker    Packs/day: 1.00    Years: 30.00    Types: Cigarettes    Quit date: 01/21/2016  . Smokeless tobacco: Never Used  . Alcohol use No     Comment: reports he used to drink approximately six beers per week but, stopped since diagnosis.  . Drug use: No  . Sexual activity: Not Currently   Other Topics Concern  . Not on file   Social History Narrative  . No  narrative on file    FAMILY HISTORY: Family History  Problem Relation Age of Onset  . CAD Father   . Cancer Neg Hx     ALLERGIES:  has No Known Allergies.  MEDICATIONS:  Current Outpatient Prescriptions  Medication Sig Dispense Refill  . feeding supplement, ENSURE ENLIVE, (ENSURE ENLIVE) LIQD Take 237 mLs by mouth 2 (two) times daily between meals. 60 Bottle 0  . Multiple Vitamin (MULTIVITAMIN WITH MINERALS) TABS tablet Take 1 tablet by mouth daily. 30 tablet 0  . acetaminophen (TYLENOL) 325 MG tablet Take 2 tablets (650 mg total) by mouth every 6 (six) hours as needed. 60 tablet 2  . aspirin EC 81 MG tablet Take 1 tablet (81 mg total) by mouth daily. 30 tablet 0  . dexamethasone (DECADRON) 4 MG tablet 5m po daily for 1 week then 278mpo daily 60 tablet 0  . famotidine (PEPCID) 20 MG tablet Take 1 tablet (20 mg total) by mouth daily. 30 tablet 6  . nicotine (NICODERM CQ - DOSED IN MG/24 HOURS) 14 mg/24hr patch Place 1 patch (14 mg total) onto the skin at bedtime. 28 patch 0   No current facility-administered medications for this visit.     REVIEW OF SYSTEMS:    10 Point review of Systems was done is negative except as noted above.  PHYSICAL EXAMINATION: ECOG PERFORMANCE STATUS: 2 - Symptomatic, <50% confined to bed  . Vitals:   03/18/16 1121  BP: 113/72  Pulse: (!) 104  Resp: 18  Temp: 97.7 F (36.5 C)   Filed Weights   03/18/16 1121  Weight: 135 lb 6.4 oz (61.4 kg)   .Body mass index is 21.85 kg/m.  GENERAL:alert, in no acute distress and comfortable SKIN: skin color, texture, turgor are normal, no rashes or significant lesions EYES: normal, conjunctiva are pink and non-injected, sclera clear OROPHARYNX:no exudate, no erythema and lips, buccal mucosa, and tongue normal  NECK: supple, no JVD, thyroid normal size, non-tender, without nodularity LYMPH:  no palpable lymphadenopathy in the cervical, axillary or inguinal LUNGS: Decreased air entry at right lung with  a few scattered rhonchi HEART: regular rate & rhythm,  no murmurs and no lower extremity edema ABDOMEN: abdomen soft, non-tender, normoactive bowel sounds  Musculoskeletal: Left foot somewhat colder than the right foot PSYCH: alert & oriented x 3 with fluent speech NEURO: no focal motor/sensory deficits  LABORATORY DATA:  I have reviewed the data as listed  . CBC Latest Ref Rng & Units 03/15/2016 02/18/2016 02/16/2016  WBC 4.0 - 10.3 10e3/uL 10.5(H) 21.6(H) 23.0(H)  Hemoglobin 13.0 - 17.1 g/dL 8.0(L) 7.4(L) 8.1(L)  Hematocrit 38.4 - 49.9 % 26.5(L) 24.3(L) 27.6(L)  Platelets 140 - 400 10e3/uL 467(H) 577(H) 694(H)    . CMP Latest Ref Rng & Units 03/15/2016 02/18/2016 02/16/2016  Glucose 70 - 140 mg/dl 103 86 100(H)  BUN 7.0 - 26.0 mg/dL 10.7 10.5 11  Creatinine 0.7 - 1.3 mg/dL 0.6(L) 0.6(L) 0.59(L)  Sodium 136 - 145 mEq/L 134(L) 134(L) 132(L)  Potassium 3.5 - 5.1 mEq/L 3.5 4.8 4.6  Chloride 98 - 110 mmol/L - - 99  CO2 22 - 29 mEq/L 20(L) 21(L) 21  Calcium 8.4 - 10.4 mg/dL 9.0 9.3 8.6  Total Protein 6.4 - 8.3 g/dL 8.3 8.5(H) -  Total Bilirubin 0.20 - 1.20 mg/dL 0.35 0.46 -  Alkaline Phos 40 - 150 U/L 663(H) 828(H) -  AST 5 - 34 U/L 28 35(H) -  ALT 0 - 55 U/L 17 21 -   EGFR mutation negative.  Component     Latest Ref Rng & Units 02/05/2016  Hepatitis B Surface Ag     Negative Negative  HCV Ab     0.0 - 0.9 s/co ratio <0.1  Hep A Ab, IgM     Negative Negative  Hep B Core Ab, IgM     Negative Negative  HIV     Non Reactive Non Reactive  CRP     <1.0 mg/dL 19.1 (H)  Sed Rate     0 - 16 mm/hr 140 (H)  RA Latex Turbid.     0.0 - 13.9 IU/mL 74.6 (H)       RADIOGRAPHIC STUDIES: I have personally reviewed the radiological images as listed and agreed with the findings in the report. Nm Pet Image Initial (pi) Skull Base To Thigh  Result Date: 03/02/2016 CLINICAL DATA:  Initial treatment strategy for lung cancer. EXAM: NUCLEAR MEDICINE PET SKULL BASE TO THIGH TECHNIQUE: 6.4  mCi F-18 FDG was injected intravenously. Full-ring PET imaging was performed from the skull base to thigh after the radiotracer. CT data was obtained and used for attenuation correction and anatomic localization. FASTING BLOOD GLUCOSE:  Value: 94 mg/dl COMPARISON:  None FINDINGS: NECK No hypermetabolic lymph nodes in the neck. CHEST Mild cardiac enlargement. There is a small pericardial effusion identified. Aortic atherosclerosis is identified. Calcification within the coronary arteries noted. The trachea appears patent and is midline. Unremarkable appearance of the esophagus. Hypermetabolic anterior mediastinal, right paratracheal,  sub- carinal and right hilar adenopathy identified. The index right paratracheal lymph node measures 1.3 cm and has an SUV max equal to 12.4. The index sub- carinal lymph node measures 2.3 cm and has an SUV max equal to 12.4. Hypermetabolic right hilar node measures 1.9 cm and has an SUV max equal to 11.3. Small right pleural effusion. Large centrally necrotic mass with rim of peripheral hypermetabolic soft tissue is identified involving the right lower lobe. This measures 12.9 cm and has an SUV max equal to 20.29. There is surrounding interstitial thickening within the right lower lobe which is favored to represent lymphangitic spread of tumor. Pulmonary nodules are identified in both lungs. Index nodule within the anteromedial left upper lobe measures 1 cm and has an SUV max equal to 6.9 index nodule within the posterior left upper lobe measures 0.9 cm and has an SUV max equal to 5.37. There is a 7 mm nodule in the right middle lobe which is too small to reliably characterize. Increased radiotracer uptake throughout the axial skeleton is identified. On the corresponding CT images no aggressive lytic or sclerotic bone lesions identified. ABDOMEN/PELVIS No abnormal hypermetabolic activity within the liver, pancreas, adrenal glands, or spleen. No hypermetabolic lymph nodes in the abdomen  or pelvis. SKELETON No focal hypermetabolic activity to suggest skeletal metastasis. IMPRESSION: 1. Intense radiotracer uptake is associated with the large necrotic mass involving the right lower lobe. There is associated mediastinal and hilar adenopathy as well as multifocal pulmonary nodules compatible with metastatic disease. Assuming non-small cell histology imaging [id number] or Stage 4 disease. 2. Pericardial effusion Electronically Signed   By: Kerby Moors M.D.   On: 03/02/2016 12:38    ASSESSMENT & PLAN:    1) Stage IV ([id number]) Rt Lung large lung biopsy proven lung adenocarcinoma with  concern for pleural thickening and pleural involvement, MRI brain neg for brain mets. EGFR, ALK, ROS-1, BRAF neg PDL1 status- pending PET/CT results noted 2) s/p Post-obstructive pneumonia 3) Emphysema PLAN -PDL1 testing -in the works --palliative RT to rt lung mass for bronchial obstruction ongoing -- shall be completed on 04/20/2016 --palliative systemic therapies for metastatic lung adenocarcinoma after completion of palliative RT -- platinum doublet vs PD1 inhibitor based on PDL1 status and PS.  4) Microcytic Anemia  Likely predominantly anemia of chronic disease Significantly elevated inflammatory markers. Also has elevated rheumatoid factor levels ?RA. No overt focal joint symptoms at this time.  . RecentLabs       Lab Results  Component Value Date   IRON 7 (L) 01/28/2016   TIBC 176 (L) 01/28/2016   IRONPCTSAT 4 (L) 01/28/2016     (Iron and TIBC)  RecentLabs       Lab Results  Component Value Date   FERRITIN 651 (H) 01/28/2016    Plan -transfuse prn to maintain Hgb close to 9 in the setting of upcoming treatment and limb ischemia symptoms. 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 -likely related to PAD due to smoking. -outpatient f/u with vascular surgery - on ASA per their recommendation. No intervention  planned. -Counseled on smoking cessation  6) Compression acute vs subacute fracture of rt L3 and L4 transverse processes  -pain mx. Pain is currently controlled   7) Smoking -counseled on importance of smoking cessation. -given prescription for nicotine patch  8) Anorexia likely due to malignancy - Dexamethasone low dose for appetite stimulation -might help with fatigue if part of  it is from RA  PRBC transfusion RTC with Dr Irene Limbo with labs in 4 weeks -MSW consultation for transportation needs  All of the patients questions were answered with apparent satisfaction. The patient knows to call the clinic with any problems, questions or concerns.  I spent 25 minutes counseling the patient face to face. The total time spent in the appointment was 25 minutes and more than 50% was on counseling and direct patient cares.    Sullivan Lone MD [place] AAHIVMS [facility] Hematology/Oncology Physician [facility]  (Office):       [phone] (Work cell):  [phone] (Fax):           [phone]

## 2016-03-22 ENCOUNTER — Ambulatory Visit
Admission: RE | Admit: 2016-03-22 | Discharge: 2016-03-22 | Disposition: A | Discharge status: Home / Self Care | Payer: Medicaid Other | Source: Ambulatory Visit | Attending: Radiation Oncology | Admitting: Radiation Oncology

## 2016-03-22 ENCOUNTER — Telehealth: Payer: Self-pay | Admitting: Hematology

## 2016-03-22 ENCOUNTER — Ambulatory Visit: Payer: Medicaid Other

## 2016-03-22 DIAGNOSIS — Z510 Encounter for antineoplastic radiation therapy: Secondary | ICD-10-CM | POA: Diagnosis not present

## 2016-03-22 NOTE — Telephone Encounter (Signed)
Left a message with the patient and if he had any questions to give [place] a call with the number

## 2016-03-23 ENCOUNTER — Ambulatory Visit: Payer: Medicaid Other

## 2016-03-23 ENCOUNTER — Ambulatory Visit
Admission: RE | Admit: 2016-03-23 | Discharge: 2016-03-23 | Disposition: A | Discharge status: Home / Self Care | Payer: Medicaid Other | Source: Ambulatory Visit | Attending: Radiation Oncology | Admitting: Radiation Oncology

## 2016-03-23 DIAGNOSIS — Z510 Encounter for antineoplastic radiation therapy: Secondary | ICD-10-CM | POA: Diagnosis not present

## 2016-03-24 ENCOUNTER — Ambulatory Visit: Payer: Medicaid Other

## 2016-03-24 ENCOUNTER — Ambulatory Visit
Admission: RE | Admit: 2016-03-24 | Discharge: 2016-03-24 | Disposition: A | Discharge status: Home / Self Care | Payer: Medicaid Other | Source: Ambulatory Visit | Attending: Radiation Oncology | Admitting: Radiation Oncology

## 2016-03-24 VITALS — BP 119/78 | HR 108 | Temp 98.0°F | Resp 18 | Wt 140.8 lb

## 2016-03-24 DIAGNOSIS — C3411 Malignant neoplasm of upper lobe, right bronchus or lung: Secondary | ICD-10-CM | POA: Diagnosis present

## 2016-03-24 DIAGNOSIS — Z510 Encounter for antineoplastic radiation therapy: Secondary | ICD-10-CM | POA: Diagnosis not present

## 2016-03-24 MED ORDER — RADIAPLEXRX EX GEL
Freq: Once | CUTANEOUS | Status: AC
  Administered 2016-03-24: 18:00:00 via TOPICAL

## 2016-03-24 NOTE — Progress Notes (Signed)
Heart rate slightly elevated. Five pound weight gain in six days. Denies pain. Continues to drink three ensures per day. Was started on decadron 4 mg once daily to stimulate appetite. No evidence of thrush noted. Reports the frequency of his dry cough is less. Denies shortness of breath. Denies dyspnea or chest pain. Reports dry mouth. No skin changes noted within treatment field. Provided patient with additional tube of radiaplex. Denies pain or difficulty associated with swallowing.   BP 119/78 (BP Location: Left Arm, Patient Position: Sitting, Cuff Size: Normal)   Pulse (!) 108   Temp 98 F (36.7 C) (Oral)   Resp 18   Wt 140 lb 12.8 oz (63.9 kg)   SpO2 97%   BMI 22.73 kg/m  Wt Readings from Last 3 Encounters:  03/24/16 140 lb 12.8 oz (63.9 kg)  03/18/16 135 lb 6.4 oz (61.4 kg)  03/11/16 130 lb 1.6 oz (59 kg)

## 2016-03-24 NOTE — Addendum Note (Signed)
Encounter addended by: Heywood Footman, RN on: 03/24/2016  5:42 PM<BR>    Actions taken: Order list changed, Diagnosis association updated, MAR administration accepted

## 2016-03-24 NOTE — Progress Notes (Signed)
  Radiation Oncology         [phone]   Name: [name] Chapman MRN: [id number]   Date: 03/24/2016  DOB: 1961-03-08   Weekly Radiation Therapy Management    ICD-9-CM ICD-10-CM   1. Malignant neoplasm of upper lobe of right lung (HCC) 162.3 C34.11     Current Dose: 28 Gy  Planned Dose:  66 Gy  Narrative The patient presents for routine under treatment assessment.  Heart rate slightly elevated. Five pound weight gain in six days. Denies pain. Continues to drink three ensures per day. Patient was started on decadron 4 mg once daily to stimulate appetite. Per nursing, no evidence of thrush noted.  No skin changes noted within treatment field. Reports decreased frequency of his dry cough. Denies shortness of breath, dyspnea, or chest pain. Reports dry mouth. Provided patient with additional tube of radiaplex. Denies pain or difficulty associated with swallowing.  The patient is without complaint. Set-up films were reviewed. The chart was checked.  Physical Findings  weight is 140 lb 12.8 oz (63.9 kg). His oral temperature is 98 F (36.7 C). His blood pressure is 119/78 and his pulse is 108 (abnormal). His respiration is 18 and oxygen saturation is 97%.  Weight essentially stable.  No significant changes.  Impression The patient is tolerating radiation.  Plan Continue treatment as planned.         Sheral Apley Tammi Klippel, M.D.  This document serves as a record of services personally performed by [name], MD. It was created on his behalf by Bethann Humble, a trained medical scribe. The creation of this record is based on the scribe's personal observations and the provider's statements to them. This document has been checked and approved by the attending provider.

## 2016-03-25 ENCOUNTER — Ambulatory Visit: Payer: Medicaid Other

## 2016-03-25 ENCOUNTER — Inpatient Hospital Stay: Admission: RE | Admit: 2016-03-25 | Payer: Self-pay | Source: Ambulatory Visit | Admitting: Radiation Oncology

## 2016-03-25 ENCOUNTER — Ambulatory Visit
Admission: RE | Admit: 2016-03-25 | Discharge: 2016-03-25 | Disposition: A | Discharge status: Home / Self Care | Payer: Medicaid Other | Source: Ambulatory Visit | Attending: Radiation Oncology | Admitting: Radiation Oncology

## 2016-03-25 DIAGNOSIS — Z510 Encounter for antineoplastic radiation therapy: Secondary | ICD-10-CM | POA: Diagnosis not present

## 2016-03-28 ENCOUNTER — Ambulatory Visit: Payer: Medicaid Other

## 2016-03-28 ENCOUNTER — Ambulatory Visit
Admission: RE | Admit: 2016-03-28 | Discharge: 2016-03-28 | Disposition: A | Discharge status: Home / Self Care | Payer: Medicaid Other | Source: Ambulatory Visit | Attending: Radiation Oncology | Admitting: Radiation Oncology

## 2016-03-28 DIAGNOSIS — Z510 Encounter for antineoplastic radiation therapy: Secondary | ICD-10-CM | POA: Diagnosis not present

## 2016-03-29 ENCOUNTER — Ambulatory Visit
Admission: RE | Admit: 2016-03-29 | Discharge: 2016-03-29 | Disposition: A | Discharge status: Home / Self Care | Payer: Medicaid Other | Source: Ambulatory Visit | Attending: Radiation Oncology | Admitting: Radiation Oncology

## 2016-03-29 ENCOUNTER — Ambulatory Visit: Payer: Medicaid Other

## 2016-03-29 DIAGNOSIS — Z510 Encounter for antineoplastic radiation therapy: Secondary | ICD-10-CM | POA: Diagnosis not present

## 2016-03-30 ENCOUNTER — Ambulatory Visit: Payer: Medicaid Other

## 2016-03-31 ENCOUNTER — Ambulatory Visit: Payer: Medicaid Other

## 2016-03-31 ENCOUNTER — Ambulatory Visit
Admission: RE | Admit: 2016-03-31 | Discharge: 2016-03-31 | Disposition: A | Discharge status: Home / Self Care | Payer: Medicaid Other | Source: Ambulatory Visit | Attending: Radiation Oncology | Admitting: Radiation Oncology

## 2016-03-31 DIAGNOSIS — Z510 Encounter for antineoplastic radiation therapy: Secondary | ICD-10-CM | POA: Diagnosis not present

## 2016-04-01 ENCOUNTER — Ambulatory Visit: Payer: Medicaid Other

## 2016-04-01 ENCOUNTER — Ambulatory Visit
Admission: RE | Admit: 2016-04-01 | Discharge: 2016-04-01 | Disposition: A | Discharge status: Home / Self Care | Payer: Medicaid Other | Source: Ambulatory Visit | Attending: Radiation Oncology | Admitting: Radiation Oncology

## 2016-04-01 ENCOUNTER — Ambulatory Visit
Admission: RE | Admit: 2016-04-01 | Discharge: 2016-04-01 | Disposition: A | Discharge status: Home / Self Care | Payer: Self-pay | Source: Ambulatory Visit | Attending: Radiation Oncology | Admitting: Radiation Oncology

## 2016-04-01 VITALS — BP 130/83 | HR 107 | Temp 98.0°F | Resp 18 | Wt 147.0 lb

## 2016-04-01 DIAGNOSIS — C3411 Malignant neoplasm of upper lobe, right bronchus or lung: Secondary | ICD-10-CM

## 2016-04-01 DIAGNOSIS — Z510 Encounter for antineoplastic radiation therapy: Secondary | ICD-10-CM | POA: Diagnosis not present

## 2016-04-01 NOTE — Progress Notes (Signed)
  Radiation Oncology         [phone]   Name: [name] Chapman MRN: [id number]   Date: 04/01/2016  DOB: 10-23-1960   Weekly Radiation Therapy Management    ICD-9-CM ICD-10-CM   1. Malignant neoplasm of upper lobe of right lung (HCC) 162.3 C34.11     Current Dose: 38 Gy  Planned Dose:  66 Gy  Narrative The patient presents for routine under treatment assessment.  Denies pain. Continues dex 2 mg daily. No evidence of thrush noted by the nurse. He continues to drink three cans of Ensure a day. Reports a dry cough. Denies shortness of breath, chest pain, pain associated with swallowing, or skin changes within the treatment field. Reports that he continues to use radiaplex as directed.  Set-up films were reviewed. The chart was checked.  Physical Findings  weight is 147 lb (66.7 kg). His oral temperature is 98 F (36.7 C). His blood pressure is 130/83 and his pulse is 107 (abnormal). His respiration is 18 and oxygen saturation is 98%.  Weight gain noted.  No significant changes.  Impression The patient is tolerating radiation. The overall size of the tumor is somewhat smaller on recent treatment scans and appears to be responding to treatment.  Plan Continue treatment as planned.         Sheral Apley Tammi Klippel, M.D.  This document serves as a record of services personally performed by [name], MD. It was created on his behalf by Darcus Austin, a trained medical scribe. The creation of this record is based on the scribe's personal observations and the provider's statements to them. This document has been checked and approved by the attending provider.

## 2016-04-01 NOTE — Progress Notes (Signed)
Heart rate elevated. Weight gain noted. Denies pain. Continues dex 2 mg daily. No evidence of thrush. Continues to drink three cans of Ensure per day. Reports a dry cough. Denies shortness of breath or chest pain. Denies skin changes within treatment field. Reports that he continues to use radiaplex as directed. Denies pain associated with swallowing.   BP 130/83 (BP Location: Left Arm, Patient Position: Sitting, Cuff Size: Normal)   Pulse (!) 107   Temp 98 F (36.7 C) (Oral)   Resp 18   Wt 147 lb (66.7 kg)   SpO2 98%   BMI 23.73 kg/m  Wt Readings from Last 3 Encounters:  04/01/16 147 lb (66.7 kg)  03/24/16 140 lb 12.8 oz (63.9 kg)  03/18/16 135 lb 6.4 oz (61.4 kg)     .

## 2016-04-04 ENCOUNTER — Ambulatory Visit: Payer: Medicaid Other

## 2016-04-04 ENCOUNTER — Ambulatory Visit
Admission: RE | Admit: 2016-04-04 | Discharge: 2016-04-04 | Disposition: A | Discharge status: Home / Self Care | Payer: Medicaid Other | Source: Ambulatory Visit | Attending: Radiation Oncology | Admitting: Radiation Oncology

## 2016-04-04 DIAGNOSIS — Z510 Encounter for antineoplastic radiation therapy: Secondary | ICD-10-CM | POA: Diagnosis not present

## 2016-04-05 ENCOUNTER — Ambulatory Visit: Payer: Medicaid Other

## 2016-04-05 ENCOUNTER — Ambulatory Visit
Admission: RE | Admit: 2016-04-05 | Discharge: 2016-04-05 | Disposition: A | Discharge status: Home / Self Care | Payer: Medicaid Other | Source: Ambulatory Visit | Attending: Radiation Oncology | Admitting: Radiation Oncology

## 2016-04-05 ENCOUNTER — Encounter (HOSPITAL_COMMUNITY): Payer: Self-pay

## 2016-04-05 DIAGNOSIS — Z510 Encounter for antineoplastic radiation therapy: Secondary | ICD-10-CM | POA: Diagnosis not present

## 2016-04-06 ENCOUNTER — Ambulatory Visit: Payer: Medicaid Other

## 2016-04-06 ENCOUNTER — Ambulatory Visit
Admission: RE | Admit: 2016-04-06 | Discharge: 2016-04-06 | Disposition: A | Discharge status: Home / Self Care | Payer: Medicaid Other | Source: Ambulatory Visit | Attending: Radiation Oncology | Admitting: Radiation Oncology

## 2016-04-06 DIAGNOSIS — Z510 Encounter for antineoplastic radiation therapy: Secondary | ICD-10-CM | POA: Diagnosis not present

## 2016-04-07 ENCOUNTER — Ambulatory Visit
Admission: RE | Admit: 2016-04-07 | Discharge: 2016-04-07 | Disposition: A | Discharge status: Home / Self Care | Payer: Medicaid Other | Source: Ambulatory Visit | Attending: Radiation Oncology | Admitting: Radiation Oncology

## 2016-04-07 ENCOUNTER — Ambulatory Visit: Payer: Medicaid Other

## 2016-04-07 DIAGNOSIS — Z510 Encounter for antineoplastic radiation therapy: Secondary | ICD-10-CM | POA: Diagnosis not present

## 2016-04-08 ENCOUNTER — Ambulatory Visit: Payer: Medicaid Other

## 2016-04-08 ENCOUNTER — Ambulatory Visit
Admission: RE | Admit: 2016-04-08 | Discharge: 2016-04-08 | Disposition: A | Discharge status: Home / Self Care | Payer: Medicaid Other | Source: Ambulatory Visit | Attending: Radiation Oncology | Admitting: Radiation Oncology

## 2016-04-08 ENCOUNTER — Ambulatory Visit
Admission: RE | Admit: 2016-04-08 | Discharge: 2016-04-08 | Disposition: A | Discharge status: Home / Self Care | Payer: Self-pay | Source: Ambulatory Visit | Attending: Radiation Oncology | Admitting: Radiation Oncology

## 2016-04-08 VITALS — BP 129/85 | HR 108 | Temp 98.0°F | Resp 18 | Wt 145.6 lb

## 2016-04-08 DIAGNOSIS — C3411 Malignant neoplasm of upper lobe, right bronchus or lung: Secondary | ICD-10-CM

## 2016-04-08 DIAGNOSIS — Z510 Encounter for antineoplastic radiation therapy: Secondary | ICD-10-CM | POA: Diagnosis not present

## 2016-04-08 MED ORDER — SUCRALFATE 1 G PO TABS: 1.0000 g | ORAL_TABLET | Freq: Three times a day (TID) | ORAL | 2 refills | Status: DC

## 2016-04-08 NOTE — Progress Notes (Signed)
  Radiation Oncology         [phone]   Name: [name] Chapman MRN: [id number]   Date: 04/08/2016  DOB: 10-26-1960   Weekly Radiation Therapy Management    ICD-9-CM ICD-10-CM   1. Malignant neoplasm of upper lobe of right lung (HCC) 162.3 C34.11     Current Dose: 48 Gy  Planned Dose:  66 Gy  Narrative The patient presents for routine under treatment assessment.  Weight loss of 2 lbs since 04/01/16. The patient has been without Ensure for four days due to financial difficulties. He states that he will be able to buy more Ensure next week. The nurse spoke with the Nutritionist and provided more Ensure for the patient. Patient enrolled in [employer] yesterday per the RN's advise, therefore nicotine patches will be provided free of charge in the future. The patient denies skin changes within treatment field. Reports using radiaplex bid as directed. Denies chest pain or shortness of breath. Reports a new onset of a sore throat and some difficulty swallowing. Continues to take dex 2 mg once a day. No evidence of thrush noted by the nurse.  Set-up films were reviewed. The chart was checked.  Physical Findings  weight is 145 lb 9.6 oz (66 kg). His oral temperature is 98 F (36.7 C). His blood pressure is 129/85 and his pulse is 108 (abnormal). His respiration is 18 and oxygen saturation is 100%.  Alert and oriented x3. Weight loss of 2 lbs noted.  Impression The patient is tolerating radiation. The overall size of the tumor is somewhat smaller on recent treatment scans and appears to be responding to treatment.  Plan Continue treatment as planned.  Difficulty swallowing discussed further with staff after patient discharge, and elected to e-prescribe carafate, nursing will notify patient.         Sheral Apley Tammi Klippel, M.D.  This document serves as a record of services personally performed by [name], MD. It was created on his behalf by Darcus Austin, a trained medical scribe. The creation  of this record is based on the scribe's personal observations and the provider's statements to them. This document has been checked and approved by the attending provider.

## 2016-04-08 NOTE — Progress Notes (Signed)
Heart rate elevated. Weight loss of two pounds noted. Patient has been without Ensure for four days due to financial constraints. Reports he will be able to buy more ensure next week. Will speak today with nutritionist about getting a few samples to hold him over. Patient enrolled in [employer]QU[employer]NOW yesterday per this RN's advise thus, nicotine patches will be provided free of charge in the future. Denies skin changes within treatment field. Reports using radiaplex bid as directed. Denies chest pain or shortness of breath. Reports new onset of a sore throat and difficulty swallowing. Continues to take dex 2 mg once a day. No evidence of thrush noted.     BP 129/85 (BP Location: Right Arm, Patient Position: Sitting, Cuff Size: Normal)   Pulse (!) 108   Temp 98 F (36.7 C) (Oral)   Resp 18   Wt 145 lb 9.6 oz (66 kg)   SpO2 100%   BMI 23.50 kg/m  Wt Readings from Last 3 Encounters:  04/08/16 145 lb 9.6 oz (66 kg)  04/01/16 147 lb (66.7 kg)  03/24/16 140 lb 12.8 oz (63.9 kg)

## 2016-04-08 NOTE — Progress Notes (Signed)
Provided patient with four sample bottles of ensure.

## 2016-04-11 ENCOUNTER — Ambulatory Visit
Admission: RE | Admit: 2016-04-11 | Discharge: 2016-04-11 | Disposition: A | Discharge status: Home / Self Care | Payer: Medicaid Other | Source: Ambulatory Visit | Attending: Radiation Oncology | Admitting: Radiation Oncology

## 2016-04-11 ENCOUNTER — Telehealth: Payer: Self-pay | Admitting: Radiation Oncology

## 2016-04-11 ENCOUNTER — Ambulatory Visit: Payer: Medicaid Other

## 2016-04-11 DIAGNOSIS — Z510 Encounter for antineoplastic radiation therapy: Secondary | ICD-10-CM | POA: Diagnosis not present

## 2016-04-11 NOTE — Telephone Encounter (Signed)
-----   Message from [name], MD sent at 04/08/2016 12:59 PM EST ----- eRx'd carafate after he left after reading your note.  Can you notify patient that carafate sent to [facility]?

## 2016-04-11 NOTE — Telephone Encounter (Signed)
Phoned patient's home. Per significant other, Deneise Lever, patient is "resting in his bed." Explained that Dr. Tammi Klippel sent a prescription for Carafate to the [facility]. Explained this medication would help relieve pain associated with swallowing. Reviewed directions for use. Deneise Lever verbalized understanding. Deneise Lever reports they will pick up the medication from the pharmacy tomorrow.

## 2016-04-12 ENCOUNTER — Ambulatory Visit: Payer: Medicaid Other

## 2016-04-12 ENCOUNTER — Ambulatory Visit
Admission: RE | Admit: 2016-04-12 | Discharge: 2016-04-12 | Disposition: A | Discharge status: Home / Self Care | Payer: Medicaid Other | Source: Ambulatory Visit | Attending: Radiation Oncology | Admitting: Radiation Oncology

## 2016-04-12 DIAGNOSIS — Z510 Encounter for antineoplastic radiation therapy: Secondary | ICD-10-CM | POA: Diagnosis not present

## 2016-04-13 ENCOUNTER — Ambulatory Visit
Admission: RE | Admit: 2016-04-13 | Discharge: 2016-04-13 | Disposition: A | Discharge status: Home / Self Care | Payer: Medicaid Other | Source: Ambulatory Visit | Attending: Radiation Oncology | Admitting: Radiation Oncology

## 2016-04-13 ENCOUNTER — Ambulatory Visit: Payer: Medicaid Other

## 2016-04-13 ENCOUNTER — Telehealth: Payer: Self-pay | Admitting: *Deleted

## 2016-04-13 ENCOUNTER — Other Ambulatory Visit: Payer: Self-pay | Admitting: *Deleted

## 2016-04-13 DIAGNOSIS — Z510 Encounter for antineoplastic radiation therapy: Secondary | ICD-10-CM | POA: Diagnosis not present

## 2016-04-13 NOTE — Telephone Encounter (Signed)
FYI "This is [name] Chapman wife.  Someone called, left a message I erased the message accidentally.  Please call back.  [phone]."

## 2016-04-14 ENCOUNTER — Ambulatory Visit: Payer: Medicaid Other

## 2016-04-14 ENCOUNTER — Telehealth: Payer: Self-pay | Admitting: Radiation Oncology

## 2016-04-14 ENCOUNTER — Ambulatory Visit
Admission: RE | Admit: 2016-04-14 | Discharge: 2016-04-14 | Disposition: A | Discharge status: Home / Self Care | Payer: Medicaid Other | Source: Ambulatory Visit | Attending: Radiation Oncology | Admitting: Radiation Oncology

## 2016-04-14 DIAGNOSIS — Z510 Encounter for antineoplastic radiation therapy: Secondary | ICD-10-CM | POA: Diagnosis not present

## 2016-04-14 NOTE — Telephone Encounter (Signed)
Received message from patient's significant other that she erased a voicemail message from the hospital accidentally. Phoned patient's home. No answer. Left message reassuring her that I haven't called her home since we spoke on 04/11/16 about the carafate. Explained I have been unable to find a telephone note in the system from anyone else who might have called. I apologized for not being able to help more and encouraged her to call with future needs. Left my return call back number.

## 2016-04-15 ENCOUNTER — Other Ambulatory Visit (HOSPITAL_BASED_OUTPATIENT_CLINIC_OR_DEPARTMENT_OTHER): Payer: Medicaid Other

## 2016-04-15 ENCOUNTER — Other Ambulatory Visit: Payer: Self-pay | Admitting: *Deleted

## 2016-04-15 ENCOUNTER — Encounter: Payer: Self-pay | Admitting: Hematology

## 2016-04-15 ENCOUNTER — Ambulatory Visit (HOSPITAL_BASED_OUTPATIENT_CLINIC_OR_DEPARTMENT_OTHER): Payer: Medicaid Other | Admitting: Hematology

## 2016-04-15 ENCOUNTER — Ambulatory Visit: Payer: Medicaid Other

## 2016-04-15 ENCOUNTER — Ambulatory Visit
Admission: RE | Admit: 2016-04-15 | Discharge: 2016-04-15 | Disposition: A | Discharge status: Home / Self Care | Payer: Medicaid Other | Source: Ambulatory Visit | Attending: Radiation Oncology | Admitting: Radiation Oncology

## 2016-04-15 VITALS — BP 134/86 | HR 92 | Resp 18 | Wt 146.0 lb

## 2016-04-15 VITALS — BP 133/96 | HR 97 | Temp 98.1°F | Resp 17 | Ht 66.0 in | Wt 146.3 lb

## 2016-04-15 DIAGNOSIS — Z510 Encounter for antineoplastic radiation therapy: Secondary | ICD-10-CM | POA: Diagnosis not present

## 2016-04-15 DIAGNOSIS — R630 Anorexia: Secondary | ICD-10-CM | POA: Diagnosis not present

## 2016-04-15 DIAGNOSIS — C3411 Malignant neoplasm of upper lobe, right bronchus or lung: Secondary | ICD-10-CM

## 2016-04-15 DIAGNOSIS — D649 Anemia, unspecified: Secondary | ICD-10-CM | POA: Diagnosis not present

## 2016-04-15 DIAGNOSIS — C3491 Malignant neoplasm of unspecified part of right bronchus or lung: Secondary | ICD-10-CM | POA: Diagnosis present

## 2016-04-15 DIAGNOSIS — Z7189 Other specified counseling: Secondary | ICD-10-CM

## 2016-04-15 LAB — COMPREHENSIVE METABOLIC PANEL
ALBUMIN: 2.8 g/dL — AB (ref 3.5–5.0)
ALK PHOS: 248 U/L — AB (ref 40–150)
ALT: 64 U/L — AB (ref 0–55)
ANION GAP: 8 meq/L (ref 3–11)
AST: 37 U/L — ABNORMAL HIGH (ref 5–34)
BILIRUBIN TOTAL: 0.34 mg/dL (ref 0.20–1.20)
BUN: 20.3 mg/dL (ref 7.0–26.0)
CO2: 23 meq/L (ref 22–29)
CREATININE: 0.6 mg/dL — AB (ref 0.7–1.3)
Calcium: 9.9 mg/dL (ref 8.4–10.4)
Chloride: 101 mEq/L (ref 98–109)
EGFR: >90 mL/min/{1.73_m2} (ref >90)
GLUCOSE: 107 mg/dL (ref 70–140)
Potassium: 3.9 mEq/L (ref 3.5–5.1)
SODIUM: 133 meq/L — AB (ref 136–145)
TOTAL PROTEIN: 8.6 g/dL — AB (ref 6.4–8.3)

## 2016-04-15 LAB — CBC & DIFF AND RETIC
BASO%: 0.1 % (ref 0.0–2.0)
BASOS ABS: 0 10*3/uL (ref 0.0–0.1)
EOS ABS: 0.1 10*3/uL (ref 0.0–0.5)
EOS%: 0.9 % (ref 0.0–7.0)
HCT: 33.7 % — ABNORMAL LOW (ref 38.4–49.9)
HEMOGLOBIN: 11.2 g/dL — AB (ref 13.0–17.1)
IMMATURE RETIC FRACT: 2.6 % — AB (ref 3.00–10.60)
LYMPH#: 0.2 10*3/uL — AB (ref 0.9–3.3)
LYMPH%: 1.5 % — AB (ref 14.0–49.0)
MCH: 25.1 pg — ABNORMAL LOW (ref 27.2–33.4)
MCHC: 33.2 g/dL (ref 32.0–36.0)
MCV: 75.6 fL — ABNORMAL LOW (ref 79.3–98.0)
MONO#: 0.5 10*3/uL (ref 0.1–0.9)
MONO%: 5 % (ref 0.0–14.0)
NEUT#: 9.8 10*3/uL — ABNORMAL HIGH (ref 1.5–6.5)
NEUT%: 92.5 % — ABNORMAL HIGH (ref 39.0–75.0)
PLATELETS: 248 10*3/uL (ref 140–400)
RBC: 4.46 10*6/uL (ref 4.20–5.82)
RDW: 26.9 % — ABNORMAL HIGH (ref 11.0–14.6)
RETIC CT ABS: 42.82 10*3/uL (ref 34.80–93.90)
Retic %: 0.96 % (ref 0.80–1.80)
WBC: 10.6 10*3/uL — ABNORMAL HIGH (ref 4.0–10.3)

## 2016-04-15 MED ORDER — RADIAPLEXRX EX GEL
Freq: Once | CUTANEOUS | Status: AC
  Administered 2016-04-15: 10:00:00 via TOPICAL

## 2016-04-15 MED ORDER — DEXAMETHASONE 4 MG PO TABS: ORAL_TABLET | ORAL | 0 refills | Status: DC

## 2016-04-15 MED ORDER — ASPIRIN EC 81 MG PO TBEC: 81.0000 mg | DELAYED_RELEASE_TABLET | Freq: Every day | ORAL | 1 refills | Status: DC

## 2016-04-15 NOTE — Progress Notes (Signed)
[name]    HEMATOLOGY/ONCOLOGY CONSULTATION NOTE  Date of Service: .04/15/2016  Patient Care Team: [name], MD as PCP - General (Family Medicine)  CHIEF COMPLAINTS/PURPOSE OF CONSULTATION:   followup for newly diagnosed lung adenocarcinoma for stagng imaging  HISTORY OF PRESENTING ILLNESS:  plz see previous note for details   INTERVAL HISTORY  Patient is here for his scheduled followup for his metastatic lung cancer. He is tolerating palliative RT well thus far other than some grade 1 fatigue. He notes that his appetite has significantl improved since being on dexamethasone and he has been eating well. No acute changes in left leg cramps/claudications especially with ambulation. No significant change in breathing. No significant cough or hemoptysis. Notes that he hasn't been smoking and continues to use his nicotine patch. Trying to work on smoking cessation. His hgb is much better at 11.2. He notes that he has not been drinking alcohol.   MEDICAL HISTORY:  Past Medical History:  Diagnosis Date  . 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 VIDEO BRONCHOSCOPY Bilateral 01/29/2016   Procedure: VIDEO BRONCHOSCOPY WITH FLUORO;  Surgeon: Collene Gobble, MD;  Location: [facility];  Service: Cardiopulmonary;  Laterality: Bilateral;    SOCIAL HISTORY: Social History   Social History  . Marital status: Single    Spouse name: N/A  . Number of children: N/A  . Years of education: N/A   Occupational History  . Not on file.   Social History Main Topics  . Smoking status: Former Smoker    Packs/day: 1.00    Years: 30.00    Types: Cigarettes    Quit date: 01/21/2016  . Smokeless tobacco: Never Used  . Alcohol use No     Comment: reports he used to drink approximately six beers per week but, stopped since diagnosis.  . Drug use: No  .  Sexual activity: Not Currently   Other Topics Concern  . Not on file   Social History Narrative  . No narrative on file    FAMILY HISTORY: Family History  Problem Relation Age of Onset  . CAD Father   . Cancer Neg Hx     ALLERGIES:  has No Known Allergies.  MEDICATIONS:  Current Outpatient Prescriptions  Medication Sig Dispense Refill  . acetaminophen (TYLENOL) 325 MG tablet Take 2 tablets (650 mg total) by mouth every 6 (six) hours as needed. 60 tablet 2  . famotidine (PEPCID) 20 MG tablet Take 1 tablet (20 mg total) by mouth daily. 30 tablet 6  . feeding supplement, ENSURE ENLIVE, (ENSURE ENLIVE) LIQD Take 237 mLs by mouth 2 (two) times daily between meals. 60 Bottle 0  . Multiple Vitamin (MULTIVITAMIN WITH MINERALS) TABS tablet Take 1 tablet by mouth daily. 30 tablet 0  . nicotine (NICODERM CQ - DOSED IN MG/24 HOURS) 14 mg/24hr patch Place 1 patch (14 mg total) onto the skin at bedtime. 28 patch 0  . sucralfate (CARAFATE) 1 g tablet Take 1 tablet (1 g total) by mouth 4 (four) times daily -  with meals and at bedtime. 5 min before meals for radiation induced esophagitis 120 tablet 2  . Wound Cleansers (RADIAPLEX EX) Apply topically.    [name] aspirin EC 81 MG tablet Take 1 tablet (81 mg total) by mouth daily. 30 tablet 1  . dexamethasone (DECADRON) 4 MG tablet 20m  po daily for 1 week then 95m po daily 60 tablet 0   No current facility-administered medications for this visit.     REVIEW OF SYSTEMS:    10 Point review of Systems was done is negative except as noted above.  PHYSICAL EXAMINATION: ECOG PERFORMANCE STATUS: 2 - Symptomatic, <50% confined to bed  . Vitals:   04/15/16 1108  BP: (!) 133/96  Pulse: 97  Resp: 17  Temp: 98.1 F (36.7 C)   Filed Weights   04/15/16 1108  Weight: 146 lb 4.8 oz (66.4 kg)   .Body mass index is 23.61 kg/m.  GENERAL:alert, in no acute distress and comfortable SKIN: skin color, texture, turgor are normal, no rashes or significant  lesions EYES: normal, conjunctiva are pink and non-injected, sclera clear OROPHARYNX:no exudate, no erythema and lips, buccal mucosa, and tongue normal  NECK: supple, no JVD, thyroid normal size, non-tender, without nodularity LYMPH:  no palpable lymphadenopathy in the cervical, axillary or inguinal LUNGS: Decreased air entry at right lung with a few scattered rhonchi HEART: regular rate & rhythm,  no murmurs and no lower extremity edema ABDOMEN: abdomen soft, non-tender, normoactive bowel sounds  Musculoskeletal: Left foot somewhat colder than the right foot PSYCH: alert & oriented x 3 with fluent speech NEURO: no focal motor/sensory deficits  LABORATORY DATA:  I have reviewed the data as listed  . CBC Latest Ref Rng & Units 04/15/2016 03/15/2016 02/18/2016  WBC 4.0 - 10.3 10e3/uL 10.6(H) 10.5(H) 21.6(H)  Hemoglobin 13.0 - 17.1 g/dL 11.2(L) 8.0(L) 7.4(L)  Hematocrit 38.4 - 49.9 % 33.7(L) 26.5(L) 24.3(L)  Platelets 140 - 400 10e3/uL 248 467(H) 577(H)    . CMP Latest Ref Rng & Units 04/15/2016 03/15/2016 02/18/2016  Glucose 70 - 140 mg/dl 107 103 86  BUN 7.0 - 26.0 mg/dL 20.3 10.7 10.5  Creatinine 0.7 - 1.3 mg/dL 0.6(L) 0.6(L) 0.6(L)  Sodium 136 - 145 mEq/L 133(L) 134(L) 134(L)  Potassium 3.5 - 5.1 mEq/L 3.9 3.5 4.8  Chloride 98 - 110 mmol/L - - -  CO2 22 - 29 mEq/L 23 20(L) 21(L)  Calcium 8.4 - 10.4 mg/dL 9.9 9.0 9.3  Total Protein 6.4 - 8.3 g/dL 8.6(H) 8.3 8.5(H)  Total Bilirubin 0.20 - 1.20 mg/dL 0.34 0.35 0.46  Alkaline Phos 40 - 150 U/L 248(H) 663(H) 828(H)  AST 5 - 34 U/L 37(H) 28 35(H)  ALT 0 - 55 U/L 64(H) 17 21   EGFR mutation negative.  Component     Latest Ref Rng & Units 02/05/2016  Hepatitis B Surface Ag     Negative Negative  HCV Ab     0.0 - 0.9 s/co ratio <0.1  Hep A Ab, IgM     Negative Negative  Hep B Core Ab, IgM     Negative Negative  HIV     Non Reactive Non Reactive  CRP     <1.0 mg/dL 19.1 (H)  Sed Rate     0 - 16 mm/hr 140 (H)  RA Latex Turbid.      0.0 - 13.9 IU/mL 74.6 (H)       RADIOGRAPHIC STUDIES: I have personally reviewed the radiological images as listed and agreed with the findings in the report. No results found.  ASSESSMENT & PLAN:    1) Stage IV (([id number] Rt Lung large lung biopsy proven lung adenocarcinoma with  concern for pleural thickening and pleural involvement, MRI brain neg for brain mets. EGFR, ALK, ROS-1, BRAF neg PDL1 status- 10% PET/CT results noted 2)  Emphysema 3) Cigarette smoking (has quit for the last several weeks - currently using nicotine patch) PLAN --palliative RT to rt lung mass for bronchial obstruction ongoing -- shall be completed on 04/20/2016 --given his PDL1 <50% and in the absence targetable mutations for his lung cancer we discussed and he is agreeable to pursue chemotherapy with platinum doublet(carboplatin + Alimta with G-CSF support) about 2 weeks after completion of palliative RT.  4) Microcytic Anemia  Likely predominantly anemia of chronic disease from lung cancer. Significantly elevated inflammatory markers. Also has elevated rheumatoid factor levels ?RA. No overt focal joint symptoms at this time. Improved after transfusion.  Plan -no indication for additional transfusion at this time. 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 -likely related to PAD due to smoking. -outpatient f/u with vascular surgery - on ASA per their recommendation. No intervention planned. -Continued smoking cessation  6) Compression acute vs subacute fracture of rt L3 and L4 transverse processes  -pain mx. Pain is currently controlled   7) Smoking -has quite a few weeks ago -continue nicotine patch  8) Anorexia likely due to malignancy. Significant improvement in appetite with Dexamethasone with good po intake. weigt stable. Improved serum albumin levels  . Wt Readings from Last 3 Encounters:    04/15/16 146 lb (66.2 kg)  04/15/16 146 lb 4.8 oz (66.4 kg)  04/08/16 145 lb 9.6 oz (66 kg)   -continue Dexamethasone low dose for appetite stimulation -might help with fatigue if part of it is from RA   Starting Carboplatin/Alimta in about 3 weeks (2 weeks after completion of RT) Chemo-counseling in about 2 weeks RTC with Dr Irene Limbo in 3weeks C1D1 of chemotherapy with labs  All of the patients questions were answered with apparent satisfaction. The patient knows to call the clinic with any problems, questions or concerns.  I spent 30 minutes counseling the patient face to face including discussion regarding chemotherapy options. The total time spent in the appointment was 40 minutes and more than 50% was on counseling and direct patient cares.    Sullivan Lone MD [place] AAHIVMS [facility] Hematology/Oncology Physician [facility]  (Office):       [phone] (Work cell):  [phone] (Fax):           [phone]

## 2016-04-15 NOTE — Progress Notes (Signed)
  Radiation Oncology         [id number]   Name: [name] Chapman MRN: [id number]   Date: 04/15/2016  DOB: 1960/09/03   Weekly Radiation Therapy Management    ICD-9-CM ICD-10-CM   1. Malignant neoplasm of upper lobe of right lung (HCC) 162.3 C34.11 hyaluronate sodium (RADIAPLEXRX) gel    Current Dose: 58 Gy  Planned Dose:  66 Gy  Narrative The patient presents for routine under treatment assessment.  Weight and vitals stable. Denies pain. Reports he is back to drinking three cans of Ensure per day. Reports he continues to wear his nicotine patches and isn't smoking. Wife reports hyperpigmentation upper back without desquamation. Reports a cough after eating but, non otherwise. Reports carafate eases difficulty/pain associated with swallowing. Hoarseness noted. Denies SOB or chest pain. Reports mild fatigue. Continues dex 2 mg daily. Nursing notes no evidence of thrush.  Set-up films were reviewed. The chart was checked.  Physical Findings  weight is 146 lb (66.2 kg). His blood pressure is 134/86 and his pulse is 92. His respiration is 18 and oxygen saturation is 100%.  Alert and oriented x3. Weight is essentially stable. Alert and in no acute distress.  Impression The patient is tolerating radiation.   Plan Continue treatment as planned. Patient was given an additional tube of radiaplex today.      Sheral Apley Tammi Klippel, M.D.  This document serves as a record of services personally performed by [name], MD. It was created on his behalf by Arlyce Harman, a trained medical scribe. The creation of this record is based on the scribe's personal observations and the provider's statements to them. This document has been checked and approved by the attending provider.

## 2016-04-15 NOTE — Progress Notes (Signed)
Weight and vitals stable. Denies pain. Reports he is back to drinking three cans of Ensure per day. Reports he continues to wear his nicotine patches and isn't smoking. Wife reports hyperpigmentation upper back without desquamation. Provided patient with additional tube of radiaplex today. Reports a cough after eating but, none otherwise. Reports carafate eases difficulty/pain associated with swallowing. Hoarseness noted. Denies shortness of breath or chest pain. Reports mild fatigue. Continues dex 2 mg day. No evidence of thrush noted.  BP 134/86 (BP Location: Left Arm, Patient Position: Sitting, Cuff Size: Normal)   Pulse 92   Resp 18   Wt 146 lb (66.2 kg)   SpO2 100%   BMI 23.57 kg/m  Wt Readings from Last 3 Encounters:  04/15/16 146 lb (66.2 kg)  04/08/16 145 lb 9.6 oz (66 kg)  04/01/16 147 lb (66.7 kg)

## 2016-04-16 DIAGNOSIS — C3491 Malignant neoplasm of unspecified part of right bronchus or lung: Secondary | ICD-10-CM | POA: Insufficient documentation

## 2016-04-16 DIAGNOSIS — Z7189 Other specified counseling: Secondary | ICD-10-CM | POA: Insufficient documentation

## 2016-04-16 MED ORDER — DEXAMETHASONE 4 MG PO TABS: ORAL_TABLET | ORAL | 1 refills | Status: DC

## 2016-04-16 MED ORDER — ONDANSETRON HCL 8 MG PO TABS: 8.0000 mg | ORAL_TABLET | Freq: Two times a day (BID) | ORAL | 1 refills | Status: DC | PRN

## 2016-04-16 MED ORDER — FOLIC ACID 1 MG PO TABS: 1.0000 mg | ORAL_TABLET | Freq: Every day | ORAL | 3 refills | Status: DC

## 2016-04-16 NOTE — Progress Notes (Signed)
START ON PATHWAY REGIMEN - Non-Small Cell Lung  [id number]: Carboplatin AUC=5 + Pemetrexed 500 mg/m2 q21 Days x 4 Cycles   A cycle is every 21 days:     Pemetrexed (Alimta(R)) 500 mg/m2 in 100 mL NS IV over 10 minutes followed 30 minutes later by carboplatin, manufacturer recommends not administering to patients with CrCl < 45 mL/min Dose Mod: None     Carboplatin (Paraplatin(R)) AUC=5 in 250 mL NS IV over 1 hour Dose Mod: None Additional Orders: Note: Patient to receive the following prior to the initiation of therapy: 1) Dexamethasone 4 mg orally twice daily x 6 doses.  First dose 24 hours before chemotherapy. 2) Folic acid >= 832 mcg orally daily.  First dose at least 5 days prior to the first dose of pemetrexed. 3) Vitamin B12 1,000 mcg intramuscularly every 9 weeks.  First dose at least 5 days prior to the first dose of pemetrexed.  All AUC calculations intended to be used in [employer]  **Always confirm dose/schedule in your pharmacy ordering system**    Patient Characteristics: Stage IV Metastatic, Non Squamous, Initial Chemotherapy/Immunotherapy, PS = 2 AJCC T Category: TX Current Disease Status: Distant Metastases AJCC N Category: NX AJCC M Category: Staged < 8th Ed. AJCC 8 Stage Grouping: IVB Histology: Non Squamous Cell ROS1 Rearrangement Status: Negative T790M Mutation Status: Not Applicable - EGFR Mutation Negative/Unknown Other Mutations/Biomarkers: No Other Actionable Mutations PD-L1 Expression Status: PD-L1 Positive 1-49% (TPS) Chemotherapy/Immunotherapy LOT: Initial Chemotherapy/Immunotherapy Molecular Targeted Therapy: Not Appropriate ALK Translocation Status: Negative Would you be surprised if this patient died  in the next year? I would NOT be surprised if this patient died in the next year EGFR Mutation Status: Negative/Wild Type BRAF V600E Mutation Status: Negative Performance Status: PS = 2  Intent of Therapy: Non-Curative / Palliative Intent, Discussed  with Patient

## 2016-04-18 ENCOUNTER — Ambulatory Visit
Admission: RE | Admit: 2016-04-18 | Discharge: 2016-04-18 | Disposition: A | Discharge status: Home / Self Care | Payer: Medicaid Other | Source: Ambulatory Visit | Attending: Radiation Oncology | Admitting: Radiation Oncology

## 2016-04-18 ENCOUNTER — Ambulatory Visit: Payer: Medicaid Other

## 2016-04-18 ENCOUNTER — Telehealth: Payer: Self-pay | Admitting: Hematology

## 2016-04-18 DIAGNOSIS — Z510 Encounter for antineoplastic radiation therapy: Secondary | ICD-10-CM | POA: Diagnosis not present

## 2016-04-18 NOTE — Telephone Encounter (Signed)
lvm to inform pt of 2/16 appt at 930 am per LOS

## 2016-04-19 ENCOUNTER — Ambulatory Visit: Payer: Medicaid Other

## 2016-04-19 ENCOUNTER — Ambulatory Visit
Admission: RE | Admit: 2016-04-19 | Discharge: 2016-04-19 | Disposition: A | Discharge status: Home / Self Care | Payer: Medicaid Other | Source: Ambulatory Visit | Attending: Radiation Oncology | Admitting: Radiation Oncology

## 2016-04-19 DIAGNOSIS — Z510 Encounter for antineoplastic radiation therapy: Secondary | ICD-10-CM | POA: Diagnosis not present

## 2016-04-20 ENCOUNTER — Ambulatory Visit: Payer: Medicaid Other

## 2016-04-20 ENCOUNTER — Ambulatory Visit
Admission: RE | Admit: 2016-04-20 | Discharge: 2016-04-20 | Disposition: A | Discharge status: Home / Self Care | Payer: Medicaid Other | Source: Ambulatory Visit | Attending: Radiation Oncology | Admitting: Radiation Oncology

## 2016-04-20 DIAGNOSIS — Z510 Encounter for antineoplastic radiation therapy: Secondary | ICD-10-CM | POA: Diagnosis not present

## 2016-04-21 ENCOUNTER — Ambulatory Visit
Admission: RE | Admit: 2016-04-21 | Discharge: 2016-04-21 | Disposition: A | Discharge status: Home / Self Care | Payer: Self-pay | Source: Ambulatory Visit | Attending: Radiation Oncology | Admitting: Radiation Oncology

## 2016-04-21 ENCOUNTER — Ambulatory Visit
Admission: RE | Admit: 2016-04-21 | Discharge: 2016-04-21 | Disposition: A | Discharge status: Home / Self Care | Payer: Medicaid Other | Source: Ambulatory Visit | Attending: Radiation Oncology | Admitting: Radiation Oncology

## 2016-04-21 ENCOUNTER — Encounter: Payer: Self-pay | Admitting: Radiation Oncology

## 2016-04-21 VITALS — BP 145/99 | HR 106 | Temp 99.6°F | Resp 18 | Wt 151.6 lb

## 2016-04-21 DIAGNOSIS — Z510 Encounter for antineoplastic radiation therapy: Secondary | ICD-10-CM | POA: Diagnosis not present

## 2016-04-21 DIAGNOSIS — C3411 Malignant neoplasm of upper lobe, right bronchus or lung: Secondary | ICD-10-CM

## 2016-04-21 NOTE — Progress Notes (Signed)
  Radiation Oncology         [phone] ________________________________  Name: [name] Chapman MRN: [id number]  Date: 04/21/2016  DOB: 09-Sep-1960   End of Treatment Note  Diagnosis: 56 y.o. gentleman with stage IV ([id number]) with adenocarcinoma of the right lower lung  Indication for treatment:  Curative, Chemo-Radiotherapy       Radiation treatment dates: 03/03/16 - 04/21/16  Site/dose:   The primary tumor and involved mediastinal adenopathy were treated to 66 Gy in 33 fractions of 2 Gy.  Beams/energy:   A five field 3D conformal treatment arrangement was used delivering 6 and 10 MV photons.  Daily image-guidance CT was used to align the treatment with the targeted volume  Narrative: The patient tolerated radiation treatment relatively well.  The patient experienced some esophagitis characterized as hoarseness, difficulty swallowing, and a sore throat. He was prescribed Carafate. He denied chest pain or SOB. The patient had fatigue. The patient's tumor appeared to decrease in size in the morning.  Plan: The patient has completed radiation treatment. The patient will return to radiation oncology clinic for routine followup in one month. I advised him to call or return sooner if he has any questions or concerns related to his recovery or treatment.  ________________________________  Sheral Apley. Tammi Klippel, M.D.  This document serves as a record of services personally performed by [name], MD. It was created on his behalf by Darcus Austin, a trained medical scribe. The creation of this record is based on the scribe's personal observations and the provider's statements to them. This document has been checked and approved by the attending provider.

## 2016-04-21 NOTE — Progress Notes (Signed)
Weight and vitals stable. Denies pain. Reports he continues to supplement his diet with three Ensures per day. Reports he continues to take dex 2 mg daily. No evidence of thrush noted. Hyperpigmentation of upper back noted without desquamation. Reports he continues to use Radiaplex as directed. Reports a dry cough following meals only. Hoarseness improved. Denies SOB or chest pain. Reports mild fatigue. One month follow up appointment card given.  BP (!) 145/99 (BP Location: Left Arm, Patient Position: Sitting, Cuff Size: Normal)   Pulse (!) 106   Temp 99.6 F (37.6 C) (Oral)   Resp 18   Wt 151 lb 9.6 oz (68.8 kg)   SpO2 93%   BMI 24.47 kg/m  Wt Readings from Last 3 Encounters:  04/21/16 151 lb 9.6 oz (68.8 kg)  04/15/16 146 lb (66.2 kg)  04/15/16 146 lb 4.8 oz (66.4 kg)

## 2016-04-21 NOTE — Progress Notes (Signed)
  Radiation Oncology         [phone]   Name: [name] Chapman MRN: [id number]   Date: 04/21/2016  DOB: 12/17/1960   Weekly Radiation Therapy Management    ICD-9-CM ICD-10-CM   1. Malignant neoplasm of upper lobe of right lung (HCC) 162.3 C34.11     Current Dose: 66 Gy  Planned Dose:  66 Gy  Narrative The patient presents for routine under treatment assessment.  Weight is steadily gaining. Vitals stable. Denies SOB or chest pain. Reports he continues to supplement his diet with three Ensures per day. Reports he continues to take dex 2 mg daily. No evidence of thrush noted by the nurse. Hyperpigmentation of upper back noted without desquamation. Reports he continues to use Radiaplex as directed. Reports a dry cough following meals only. Hoarseness is improving. Reports mild fatigue.  Set-up films were reviewed. The chart was checked.  Physical Findings  weight is 151 lb 9.6 oz (68.8 kg). His oral temperature is 99.6 F (37.6 C). His blood pressure is 145/99 (abnormal) and his pulse is 106 (abnormal). His respiration is 18 and oxygen saturation is 93%.  Alert and oriented x3. Weight gain noted. Alert and in no acute distress.  Impression The patient has tolerated radiation. The patient's tumor is shrinking on his treatment scans.  Plan The patient has completed treatment and a one month follow up appointment card given by the nurse.      Sheral Apley Tammi Klippel, M.D.  This document serves as a record of services personally performed by [name], MD. It was created on his behalf by Darcus Austin, a trained medical scribe. The creation of this record is based on the scribe's personal observations and the provider's statements to them. This document has been checked and approved by the attending provider.

## 2016-04-25 ENCOUNTER — Other Ambulatory Visit: Payer: Self-pay | Admitting: Radiation Oncology

## 2016-04-25 ENCOUNTER — Ambulatory Visit: Payer: Self-pay | Admitting: Radiation Oncology

## 2016-04-25 ENCOUNTER — Telehealth: Payer: Self-pay | Admitting: Radiation Oncology

## 2016-04-25 DIAGNOSIS — R6883 Chills (without fever): Secondary | ICD-10-CM

## 2016-04-25 DIAGNOSIS — C3411 Malignant neoplasm of upper lobe, right bronchus or lung: Secondary | ICD-10-CM

## 2016-04-25 NOTE — Telephone Encounter (Addendum)
Returned patient's call. Spoke with patient's significant other, Deneise Lever. She reports the patient is in the bed no feeling well. She explains the patient has new onset chills, sore throat and skin irritation. She reports the patient does not have a fever. She verbalized the patient hasn't eaten anything today.  Deneise Lever is requesting the patient be evaluated. Provided her with an appointment for him tomorrow to be evaluated by Shona Simpson, PA-C. Encouraged her to have him resume Carafate as directed prior to completion. She verbalized understanding of all reviewed.

## 2016-04-25 NOTE — Progress Notes (Addendum)
Mr. [name] Chapman 56 y.o. 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  Fever:Afebrile at 98 Skin irritation:Back with dry desquamation and peeling with pain, using Radiaplex gel. Weight changes, if any:  Wt Readings from Last 3 Encounters:  04/26/16 143 lb (64.9 kg)  04/21/16 151 lb 9.6 oz (68.8 kg)  04/15/16 146 lb (66.2 kg)  8 pound weight loss Respiratory complaints, if any: SOB, dry coughing  Hemoptysis, if any: None  Swallowing Problems/Pain/Difficulty swallowing:Difficult sometimes when eating Smoking Tobacco/Marijuana/Snuff/ETOH use: Former smoker x 30 years quit 01-25-16 1 p/d no alcohol or drug usage Appetite :Poor since Monday, 04-25-16 drinking 2-3 ensure enlive a day Pain: 9/10 to back taking Tylenol          Complaints of sore throat complaint of pain to lower right tooth pain some gum irritation, no evidence of thrush When is next chemo scheduled?:No Lab work from of chart:04-26-16 CBC w diff Imaging:None BP 91/70   Pulse (!) 127   Temp 98 F (36.7 C) (Oral)   Resp 18   Ht '5\' 6"'$  (1.676 m)   Wt 143 lb (64.9 kg)   SpO2 95%   BMI 23.08 kg/m Right arm sitting BP (!) 87/67   Pulse (!) 122   Temp 98 F (36.7 C) (Oral)   Resp 18   Ht '5\' 6"'$  (1.676 m)   Wt 143 lb (64.9 kg)   SpO2 95%   BMI 23.08 kg/m Right arm standing 1040 Transferred to the Emergency room by wheelchair for evaluation wife with her husband.  Report given to Emergency room staff by Shona Simpson, PAC prior to the transfer.

## 2016-04-25 NOTE — Telephone Encounter (Signed)
Phoned Deneise Lever back instructed her to have the patient at [facility] at 0900 for a stat CBC and to put on a mask upon entering the center. Explained that following lab they should present to the radiation oncology area for a follow up appointment with Shona Simpson. She verbalized understanding.

## 2016-04-26 ENCOUNTER — Other Ambulatory Visit: Payer: Self-pay

## 2016-04-26 ENCOUNTER — Encounter (HOSPITAL_COMMUNITY): Payer: Self-pay

## 2016-04-26 ENCOUNTER — Encounter: Payer: Self-pay | Admitting: Radiation Oncology

## 2016-04-26 ENCOUNTER — Ambulatory Visit
Admission: RE | Admit: 2016-04-26 | Discharge: 2016-04-26 | Disposition: A | Discharge status: Home / Self Care | Payer: Medicaid Other | Source: Ambulatory Visit | Attending: Radiation Oncology | Admitting: Radiation Oncology

## 2016-04-26 ENCOUNTER — Emergency Department (HOSPITAL_COMMUNITY): Payer: Medicaid Other

## 2016-04-26 ENCOUNTER — Inpatient Hospital Stay (HOSPITAL_COMMUNITY)
Admission: EM | Admit: 2016-04-26 | Discharge: 2016-04-28 | DRG: 189 | Disposition: A | Discharge status: Home Health | Payer: Medicaid Other | Attending: Family Medicine | Admitting: Family Medicine

## 2016-04-26 VITALS — BP 87/67 | HR 122 | Temp 98.0°F | Resp 18 | Ht 66.0 in | Wt 143.0 lb

## 2016-04-26 DIAGNOSIS — D509 Iron deficiency anemia, unspecified: Secondary | ICD-10-CM | POA: Diagnosis present

## 2016-04-26 DIAGNOSIS — Z66 Do not resuscitate: Secondary | ICD-10-CM | POA: Diagnosis present

## 2016-04-26 DIAGNOSIS — Z515 Encounter for palliative care: Secondary | ICD-10-CM

## 2016-04-26 DIAGNOSIS — C3411 Malignant neoplasm of upper lobe, right bronchus or lung: Secondary | ICD-10-CM

## 2016-04-26 DIAGNOSIS — E222 Syndrome of inappropriate secretion of antidiuretic hormone: Secondary | ICD-10-CM | POA: Diagnosis present

## 2016-04-26 DIAGNOSIS — D638 Anemia in other chronic diseases classified elsewhere: Secondary | ICD-10-CM | POA: Diagnosis present

## 2016-04-26 DIAGNOSIS — Z923 Personal history of irradiation: Secondary | ICD-10-CM

## 2016-04-26 DIAGNOSIS — Z86718 Personal history of other venous thrombosis and embolism: Secondary | ICD-10-CM

## 2016-04-26 DIAGNOSIS — E43 Unspecified severe protein-calorie malnutrition: Secondary | ICD-10-CM | POA: Diagnosis present

## 2016-04-26 DIAGNOSIS — J439 Emphysema, unspecified: Secondary | ICD-10-CM | POA: Diagnosis present

## 2016-04-26 DIAGNOSIS — Z7952 Long term (current) use of systemic steroids: Secondary | ICD-10-CM

## 2016-04-26 DIAGNOSIS — Z87891 Personal history of nicotine dependence: Secondary | ICD-10-CM

## 2016-04-26 DIAGNOSIS — J9600 Acute respiratory failure, unspecified whether with hypoxia or hypercapnia: Secondary | ICD-10-CM | POA: Diagnosis present

## 2016-04-26 DIAGNOSIS — I745 Embolism and thrombosis of iliac artery: Secondary | ICD-10-CM | POA: Diagnosis present

## 2016-04-26 DIAGNOSIS — R6883 Chills (without fever): Secondary | ICD-10-CM

## 2016-04-26 DIAGNOSIS — J9601 Acute respiratory failure with hypoxia: Secondary | ICD-10-CM | POA: Diagnosis not present

## 2016-04-26 DIAGNOSIS — Z79899 Other long term (current) drug therapy: Secondary | ICD-10-CM

## 2016-04-26 DIAGNOSIS — E872 Acidosis: Secondary | ICD-10-CM | POA: Diagnosis present

## 2016-04-26 DIAGNOSIS — Z510 Encounter for antineoplastic radiation therapy: Secondary | ICD-10-CM | POA: Diagnosis not present

## 2016-04-26 LAB — CBC WITH DIFFERENTIAL/PLATELET
BASO%: 0.2 % (ref 0.0–2.0)
BASOS PCT: 0 %
Basophils Absolute: 0 10*3/uL (ref 0.0–0.1)
Basophils Absolute: 0 10*3/uL (ref 0.0–0.1)
EOS ABS: 0.1 10*3/uL (ref 0.0–0.7)
EOS%: 0.8 % (ref 0.0–7.0)
Eosinophils Absolute: 0 10*3/uL (ref 0.0–0.5)
Eosinophils Relative: 1 %
HCT: 37 % — ABNORMAL LOW (ref 38.4–49.9)
HCT: 38.3 % — ABNORMAL LOW (ref 39.0–52.0)
HGB: 12.3 g/dL — ABNORMAL LOW (ref 13.0–17.1)
Hemoglobin: 12.8 g/dL — ABNORMAL LOW (ref 13.0–17.0)
LYMPH%: 4.2 % — AB (ref 14.0–49.0)
Lymphocytes Relative: 5 %
Lymphs Abs: 0.3 10*3/uL — ABNORMAL LOW (ref 0.7–4.0)
MCH: 26.3 pg — ABNORMAL LOW (ref 27.2–33.4)
MCH: 26.2 pg (ref 26.0–34.0)
MCHC: 33.2 g/dL (ref 32.0–36.0)
MCHC: 33.4 g/dL (ref 30.0–36.0)
MCV: 79.1 fL — ABNORMAL LOW (ref 79.3–98.0)
MCV: 78.5 fL (ref 78.0–100.0)
MONO#: 0.3 10*3/uL (ref 0.1–0.9)
MONO%: 4.4 % (ref 0.0–14.0)
Monocytes Absolute: 0.2 10*3/uL (ref 0.1–1.0)
Monocytes Relative: 3 %
NEUT#: 5.3 10*3/uL (ref 1.5–6.5)
NEUT%: 90.4 % — ABNORMAL HIGH (ref 39.0–75.0)
NEUTROS PCT: 91 %
Neutro Abs: 5 10*3/uL (ref 1.7–7.7)
PLATELETS: 310 10*3/uL (ref 140–400)
PLATELETS: 277 10*3/uL (ref 150–400)
RBC: 4.68 10*6/uL (ref 4.20–5.82)
RBC: 4.88 MIL/uL (ref 4.22–5.81)
RDW: 29.2 % — ABNORMAL HIGH (ref 11.0–14.6)
RDW: 25.8 % — ABNORMAL HIGH (ref 11.5–15.5)
WBC: 5.9 10*3/uL (ref 4.0–10.3)
WBC: 5.6 10*3/uL (ref 4.0–10.5)
lymph#: 0.3 10*3/uL — ABNORMAL LOW (ref 0.9–3.3)

## 2016-04-26 LAB — COMPREHENSIVE METABOLIC PANEL
ALBUMIN: 3.2 g/dL — AB (ref 3.5–5.0)
ALT: 35 U/L (ref 17–63)
ANION GAP: 14 (ref 5–15)
AST: 40 U/L (ref 15–41)
Alkaline Phosphatase: 142 U/L — ABNORMAL HIGH (ref 38–126)
BILIRUBIN TOTAL: 0.9 mg/dL (ref 0.3–1.2)
BUN: 22 mg/dL — ABNORMAL HIGH (ref 6–20)
CO2: 17 mmol/L — ABNORMAL LOW (ref 22–32)
Calcium: 9.1 mg/dL (ref 8.9–10.3)
Chloride: 98 mmol/L — ABNORMAL LOW (ref 101–111)
Creatinine, Ser: 0.9 mg/dL (ref 0.61–1.24)
GLUCOSE: 113 mg/dL — AB (ref 65–99)
POTASSIUM: 4.3 mmol/L (ref 3.5–5.1)
Sodium: 129 mmol/L — ABNORMAL LOW (ref 135–145)
TOTAL PROTEIN: 8.3 g/dL — AB (ref 6.5–8.1)

## 2016-04-26 LAB — TROPONIN I

## 2016-04-26 LAB — BRAIN NATRIURETIC PEPTIDE: B Natriuretic Peptide: 37.8 pg/mL (ref 0.0–100.0)

## 2016-04-26 LAB — OSMOLALITY: OSMOLALITY: 269 mosm/kg — AB (ref 275–295)

## 2016-04-26 LAB — LACTIC ACID, PLASMA: Lactic Acid, Venous: 1.3 mmol/L (ref 0.5–1.9)

## 2016-04-26 MED ORDER — NICOTINE 7 MG/24HR TD PT24
7.0000 mg | MEDICATED_PATCH | Freq: Every day | TRANSDERMAL | Status: DC
  Administered 2016-04-27 – 2016-04-28 (×2): 7 mg via TRANSDERMAL
  Filled 2016-04-26 (×2): qty 1

## 2016-04-26 MED ORDER — HYDROCODONE-ACETAMINOPHEN 5-325 MG PO TABS: 1.0000 | ORAL_TABLET | Freq: Four times a day (QID) | ORAL | 0 refills | Status: DC | PRN

## 2016-04-26 MED ORDER — SODIUM CHLORIDE 0.9% FLUSH
3.0000 mL | Freq: Two times a day (BID) | INTRAVENOUS | Status: DC
  Administered 2016-04-27 – 2016-04-28 (×4): 3 mL via INTRAVENOUS

## 2016-04-26 MED ORDER — ENOXAPARIN SODIUM 40 MG/0.4ML ~~LOC~~ SOLN
40.0000 mg | SUBCUTANEOUS | Status: DC
  Administered 2016-04-26 – 2016-04-27 (×2): 40 mg via SUBCUTANEOUS
  Filled 2016-04-26 (×2): qty 0.4

## 2016-04-26 MED ORDER — HYDROCODONE-ACETAMINOPHEN 5-325 MG PO TABS
1.0000 | ORAL_TABLET | Freq: Four times a day (QID) | ORAL | Status: DC | PRN
  Administered 2016-04-26 – 2016-04-27 (×2): 1 via ORAL
  Filled 2016-04-26 (×2): qty 1

## 2016-04-26 MED ORDER — ACETAMINOPHEN 325 MG PO TABS: 650.0000 mg | ORAL_TABLET | Freq: Four times a day (QID) | ORAL | Status: DC | PRN

## 2016-04-26 MED ORDER — ONDANSETRON HCL 4 MG PO TABS: 4.0000 mg | ORAL_TABLET | Freq: Four times a day (QID) | ORAL | Status: DC | PRN

## 2016-04-26 MED ORDER — ONDANSETRON HCL 4 MG/2ML IJ SOLN: 4.0000 mg | Freq: Four times a day (QID) | INTRAMUSCULAR | Status: DC | PRN

## 2016-04-26 MED ORDER — IOPAMIDOL (ISOVUE-370) INJECTION 76%
100.0000 mL | Freq: Once | INTRAVENOUS | Status: AC | PRN
  Administered 2016-04-26: 100 mL via INTRAVENOUS

## 2016-04-26 MED ORDER — FAMOTIDINE 20 MG PO TABS
10.0000 mg | ORAL_TABLET | Freq: Two times a day (BID) | ORAL | Status: DC
  Administered 2016-04-26 – 2016-04-28 (×4): 10 mg via ORAL
  Filled 2016-04-26 (×4): qty 1

## 2016-04-26 MED ORDER — SODIUM CHLORIDE 0.9 % IJ SOLN
INTRAMUSCULAR | Status: AC
Start: 2016-04-26 — End: 2016-04-27
  Filled 2016-04-26: qty 50

## 2016-04-26 MED ORDER — DEXAMETHASONE SODIUM PHOSPHATE 10 MG/ML IJ SOLN
10.0000 mg | Freq: Four times a day (QID) | INTRAMUSCULAR | Status: DC
  Administered 2016-04-27 (×2): 10 mg via INTRAVENOUS
  Filled 2016-04-26 (×5): qty 1

## 2016-04-26 MED ORDER — IOPAMIDOL (ISOVUE-370) INJECTION 76%
INTRAVENOUS | Status: AC
  Filled 2016-04-26: qty 100

## 2016-04-26 MED ORDER — SODIUM CHLORIDE 0.9 % IV SOLN: 250.0000 mL | INTRAVENOUS | Status: DC | PRN

## 2016-04-26 MED ORDER — SODIUM CHLORIDE 0.9% FLUSH
3.0000 mL | INTRAVENOUS | Status: DC | PRN
  Administered 2016-04-27: 3 mL via INTRAVENOUS
  Filled 2016-04-26: qty 3

## 2016-04-26 NOTE — ED Notes (Signed)
No lactic acid at this time

## 2016-04-26 NOTE — ED Notes (Signed)
ED Provider at bedside. 

## 2016-04-26 NOTE — ED Notes (Signed)
Patient transported to X-ray 

## 2016-04-26 NOTE — ED Notes (Signed)
PT ASSISTED WITH URINAL AT BEDSIDE. PT WITH INCREASED SOB WITHOUT CP. PT ASSISTED BACK TO BED. PT CONTINUED TO HAVE SOB WITH LONG RECOVERY. EDP NANAVATI MADE AWARE OF PT EVENT AND CURRENT STATUS.

## 2016-04-26 NOTE — ED Notes (Signed)
Patient transported to CT 

## 2016-04-26 NOTE — ED Notes (Addendum)
PER ADMISSION MD- PT IS DNR. PT CURRENT VITAL SIGNS AND CURRENT STATUS. ASSIGNED BED TO 3RD FLOOR IS APPROPRIATE

## 2016-04-26 NOTE — ED Notes (Signed)
RRT PRESENT OBTAINING  BLOOD GAS

## 2016-04-26 NOTE — ED Notes (Signed)
ADMISSION MD PRESENT 

## 2016-04-26 NOTE — Progress Notes (Signed)
Radiation Oncology         [phone] ________________________________  Name: [name] Chapman MRN: [id number]  Date: 04/26/2016  DOB: 1960/08/20  Post Treatment Note  CC: Dimas Chyle, MD  Brunetta Genera, MD  Diagnosis:  Stage IV [phone]) with adenocarcinoma of the right lower lung  Interval Since Last Radiation: 1 week  03/03/16 - 04/21/16: The primary tumor and involved mediastinal adenopathy were treated to 66 Gy in 33 fractions of 2 Gy.  Narrative:  The patient returns today for a work in visit due to concerns with sore throat, cold chills, and sweats. The patient finished radiotherapy to the right chest on Thursday of last week. He did have dry desquamation towards the end of treatment, he has been also having some radiation esophagitis and has been using Carafate for this.  Of note he has not begun systemic therapy was scheduled for chemotherapy to begin this Friday.                      On review of systems, the patient states he is feeling quite poorly, he describes an episode of hot flashes and cold sweats chills Sunday and yesterday. He did not have a weighted check his blood pressure for temperature at home. He states that he also developed a sore throat in the back of the posterior pharynx and the last 2-3 days. He is not coughing up any mucus or blood. He denies any sinus drainage or congestion. He reports he is trying taken liquids with the assistance of Carafate, but has been having difficulty with continuing to eat and drink regularly. He denies any chest pain, but does state that he has been more short of breath the last 2-3 days, and that any real activity causes him to have to rest in order to catch his breath. He denies any lower extremity edema. No other complaints or verbalized.  ALLERGIES:  has No Known Allergies.  Meds: Current Outpatient Prescriptions  Medication Sig Dispense Refill  . acetaminophen (TYLENOL) 325 MG tablet Take 2 tablets (650 mg total) by mouth  every 6 (six) hours as needed. 60 tablet 2  . aspirin EC 81 MG tablet Take 1 tablet (81 mg total) by mouth daily. 30 tablet 1  . dexamethasone (DECADRON) 4 MG tablet '4mg'$  po daily for 1 week then '2mg'$  po daily 60 tablet 0  . dexamethasone (DECADRON) 4 MG tablet Take 1 tab two times a day the day before Alimta chemo. Take 2 tabs two times a day starting the day after chemo for 3 days. 30 tablet 1  . famotidine (PEPCID) 20 MG tablet Take 1 tablet (20 mg total) by mouth daily. 30 tablet 6  . feeding supplement, ENSURE ENLIVE, (ENSURE ENLIVE) LIQD Take 237 mLs by mouth 2 (two) times daily between meals. 60 Bottle 0  . folic acid (FOLVITE) 1 MG tablet Take 1 tablet (1 mg total) by mouth daily. Start 5-7 days before Alimta chemotherapy. Continue until 21 days after Alimta completed. 100 tablet 3  . Multiple Vitamin (MULTIVITAMIN WITH MINERALS) TABS tablet Take 1 tablet by mouth daily. 30 tablet 0  . nicotine (NICODERM CQ - DOSED IN MG/24 HOURS) 14 mg/24hr patch Place 1 patch (14 mg total) onto the skin at bedtime. 28 patch 0  . ondansetron (ZOFRAN) 8 MG tablet Take 1 tablet (8 mg total) by mouth 2 (two) times daily as needed for refractory nausea / vomiting. Start on day 3 after chemo. [place]  tablet 1  . sucralfate (CARAFATE) 1 g tablet Take 1 tablet (1 g total) by mouth 4 (four) times daily -  with meals and at bedtime. 5 min before meals for radiation induced esophagitis 120 tablet 2  . Wound Cleansers (RADIAPLEX EX) Apply topically.    [name] HYDROcodone-acetaminophen (NORCO) 5-325 MG tablet Take 1 tablet by mouth every 6 (six) hours as needed for moderate pain. 60 tablet 0   No current facility-administered medications for this encounter.     Physical Findings:  height is '5\' 6"'$  (1.676 m) and weight is 143 lb (64.9 kg). His oral temperature is 98 F (36.7 C). His blood pressure is 87/67 (abnormal) and his pulse is 122 (abnormal). His respiration is 18 and oxygen saturation is 95%.  Pain Assessment Pain Score:  9  (Back)/10 In general this is a chronically illl appearing in no acute distress. He's alert and oriented x4 and appropriate throughout the examination. Cardiopulmonary assessment is negative for acute distress and he has a RRR, no C/R/M. Chest is clear in the right Middle and upper fields, with decreased breath sounds at the right base. Desquamation is noted of the chest wall, it is dry, with skin breakdown. Biafine was placed over the skin in a thin layer. The rest of the tube was provided to the patient. The left chest wall is auscultated and no evidence of abnormal breath sounds, rhonchi or crackles are noted.  Lab Findings: Lab Results  Component Value Date   WBC 5.9 04/26/2016   HGB 12.3 (L) 04/26/2016   HCT 37.0 (L) 04/26/2016   MCV 79.1 (L) 04/26/2016   PLT 310 04/26/2016     Radiographic Findings: No results found.  Impression/Plan: 1. Stage IV ([id number]) with adenocarcinoma of the right lower lung. The patient is still recovering from the effects of radiotherapy. His desquamation is dry which is reassuring. I have given him a tube of Biafine cream to use BID. His skin has received significant toxicity which we anticipate will recover in the next few weeks. He will call if he is having any other concerns prior to his next visit in April.  2. Shortness of breath, hypotension, history of  Left common iliac occlusion. He has been waiting to move forward with vascular surgery procedures, but has had to postpone this due to his diagnosis of cancer. I'm concerned that he may be having symptoms of PE. We will send him for urgent evaluation and to rule out PE. I'm not sure if he will need some type of procedure however to protect him from PE/atrial clot due to his history of common iliac clot as he is not on any anticoagulation. We will defer further decisions regarding this to vascular surgery regarding this iliac occlusion. 3. Sweats/cold chills, sore throat. Although the patient's symptoms of  sweats and chills could be concerning for fever, he does not have a documented fever. He continues taking tylenol for pain regarding his desquamation on the chest, so this could be preventing [place] from seeing documentation of a fever today. He may however have a viral URI. We will defer further work up of this to the ED.   Carola Rhine, PAC

## 2016-04-26 NOTE — H&P (Signed)
History and Physical    [name] Chapman YFV:[id number] DOB: 1960-03-25 DOA: 04/26/2016    PCP: Dimas Chyle, MD  Patient coming from: home  Chief Complaint: shortness of breath  HPI: [name] Chapman is a 56 y.o. 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 He is found to have a pulse ox of < 88% on ambulation. He states he has been short of breath for about 4-5 days now which is worse with exertion. He does not have a fever, cough or chills. No one sick at home.     ED Course: HR > 100, RR in high 20s and 30s, pulse ox 87 % on 4 l, sodium 129- CT chest shows progression of cancer with new masses- no other abnormality such as PE or pneumonia  Review of Systems:  All other systems reviewed and apart from HPI, are negative.  Past Medical History:  Diagnosis Date  . 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 VIDEO BRONCHOSCOPY Bilateral 01/29/2016   Procedure: VIDEO BRONCHOSCOPY WITH FLUORO;  Surgeon: Collene Gobble, MD;  Location: [place];  Service: Cardiopulmonary;  Laterality: Bilateral;    Social History:   reports that he quit smoking about 3 months ago. His smoking use included Cigarettes. He has a 30.00 pack-year smoking history. He has never used smokeless tobacco. He reports that he does not drink alcohol or use drugs.  No Known Allergies  Family History  Problem Relation Age of Onset  . CAD Father   . Cancer Neg Hx      Prior to Admission medications   Medication Sig Start Date End Date Taking? Authorizing Provider  acetaminophen (TYLENOL) 325 MG tablet Take 2 tablets (650 mg total) by mouth every 6 (six) hours as needed. 03/18/16  Yes Brunetta Genera, MD  aspirin EC 81 MG tablet Take 1 tablet (81 mg total) by mouth daily. 04/15/16  Yes Brunetta Genera, MD  dexamethasone (DECADRON) 4 MG tablet '4mg'$  po daily for 1 week then '2mg'$  po daily 04/15/16  Yes Brunetta Genera, MD  famotidine (PEPCID) 20 MG tablet Take 1 tablet (20 mg total) by mouth daily. 03/18/16  Yes Brunetta Genera, MD  feeding supplement, ENSURE ENLIVE, (ENSURE ENLIVE) LIQD Take 237 mLs by mouth 2 (two) times daily between meals. 02/06/16  Yes Shanker Kristeen Mans, MD  Multiple Vitamin (MULTIVITAMIN WITH MINERALS) TABS tablet Take 1 tablet by mouth daily. 02/07/16  Yes Shanker Kristeen Mans, MD  nicotine (NICODERM CQ - DOSED IN MG/24 HOURS) 14 mg/24hr patch Place 1 patch (14 mg total) onto the skin at bedtime. 03/18/16  Yes Brunetta Genera, MD  sucralfate (CARAFATE) 1 g tablet Take 1 tablet (1 g total) by mouth 4 (four) times daily -  with meals and at bedtime. 5 min before meals for radiation induced esophagitis 04/08/16  Yes [name], MD  Wound Cleansers (RADIAPLEX EX) Apply 1 application topically 2 (two) times daily.    Yes Historical Provider, MD  folic acid (FOLVITE) 1 MG tablet Take 1 tablet (1 mg total) by mouth daily. Start 5-7 days before Alimta chemotherapy. Continue until 21 days after  Alimta completed. Patient not taking: Reported on 04/26/2016 04/16/16   Brunetta Genera, MD  HYDROcodone-acetaminophen [facility]) 5-325 MG tablet Take 1 tablet by mouth every 6 (six) hours as needed for moderate pain. 04/26/16   Hayden Pedro, PA-C  ondansetron (ZOFRAN) 8 MG tablet Take 1 tablet (8 mg total) by mouth 2 (two) times daily as needed for refractory nausea / vomiting. Start on day 3 after chemo. 04/16/16   Brunetta Genera, MD    Physical Exam: Vitals:   04/26/16 1112 04/26/16 1136 04/26/16 1142 04/26/16 1231  BP: 91/76 96/70  91/68  Pulse: 112 113 115 106  Resp: '16  26 26  '$ Temp: 98.2 F (36.8 C)  98.1 F (36.7 C)   TempSrc: Oral  Oral   SpO2: 90%  (!) 83% 95% 93%  Weight:   64.9 kg (143 lb)   Height:   '5\' 6"'$  (1.676 m)       Constitutional: NAD, calm, comfortable Eyes: PERTLA, lids and conjunctivae normal ENMT: Mucous membranes are moist. Posterior pharynx clear of any exudate or lesions. Normal dentition.  Neck: normal, supple, no masses, no thyromegaly Respiratory: clear to auscultation bilaterally, no wheezing, no crackles. Normal respiratory effort. No accessory muscle use.  Cardiovascular: S1 & S2 heard, regular rate and rhythm, no murmurs / rubs / gallops. No extremity edema. 2+ pedal pulses. No carotid bruits.  Abdomen: No distension, no tenderness, no masses palpated. No hepatosplenomegaly. Bowel sounds normal.  Musculoskeletal: no clubbing / cyanosis. No joint deformity upper and lower extremities. Good ROM, no contractures. Normal muscle tone.  Skin: no rashes, lesions, ulcers. No induration Neurologic: CN 2-12 grossly intact. Sensation intact, DTR normal. Strength 5/5 in all 4 limbs.  Psychiatric: Normal judgment and insight. Alert and oriented x 3. Normal mood.     Labs on Admission: I have personally reviewed following labs and imaging studies  CBC:  Recent Labs Lab 04/26/16 0904 04/26/16 1200  WBC 5.9 5.6  NEUTROABS 5.3 5.0  HGB 12.3* 12.8*  HCT 37.0* 38.3*  MCV 79.1* 78.5  PLT 310 505   Basic Metabolic Panel:  Recent Labs Lab 04/26/16 1200  NA 129*  K 4.3  CL 98*  CO2 17*  GLUCOSE 113*  BUN 22*  CREATININE 0.90  CALCIUM 9.1   GFR: Estimated Creatinine Clearance: 83.7 mL/min (by C-G formula based on SCr of 0.9 mg/dL). Liver Function Tests:  Recent Labs Lab 04/26/16 1200  AST 40  ALT 35  ALKPHOS 142*  BILITOT 0.9  PROT 8.3*  ALBUMIN 3.2*   No results for input(s): LIPASE, AMYLASE in the last 168 hours. No results for input(s): AMMONIA in the last 168 hours. Coagulation Profile: No results for input(s): INR, PROTIME in the last 168 hours. Cardiac Enzymes:  Recent Labs Lab  04/26/16 1200  TROPONINI <0.03   BNP (last 3 results) No results for input(s): PROBNP in the last 8760 hours. HbA1C: No results for input(s): HGBA1C in the last 72 hours. CBG: No results for input(s): GLUCAP in the last 168 hours. Lipid Profile: No results for input(s): CHOL, HDL, LDLCALC, TRIG, CHOLHDL, LDLDIRECT in the last 72 hours. Thyroid Function Tests: No results for input(s): TSH, T4TOTAL, FREET4, T3FREE, THYROIDAB in the last 72 hours. Anemia Panel: No results for input(s): VITAMINB12, FOLATE, FERRITIN, TIBC, IRON, RETICCTPCT in the last 72 hours. Urine analysis:    Component Value Date/Time   COLORURINE YELLOW 01/27/2016 [place] 01/27/2016 1246   LABSPEC 1.022 01/27/2016 1246  PHURINE 6.0 01/27/2016 1246   GLUCOSEU NEGATIVE 01/27/2016 1246   [place] 01/27/2016 [place] 01/27/2016 [place] 01/27/2016 [place] 01/27/2016 1246   NITRITE NEGATIVE 01/27/2016 1246   LEUKOCYTESUR NEGATIVE 01/27/2016 1246   Sepsis Labs: '@LABRCNTIP'$ (procalcitonin:4,lacticidven:4) )No results found for this or any previous visit (from the past 240 hour(s)).   Radiological Exams on Admission: Dg Chest 2 View  Result Date: 04/26/2016 CLINICAL DATA:  Increasing shortness of breath and cough over the last few days EXAM: CHEST  2 VIEW COMPARISON:  03/02/2016 FINDINGS: Persistent right lower lobe mass lesion is seen. Nodules are noted bilaterally consistent with metastatic disease. The left upper lobe nodule measures 2.5 cm increased in size from 9 mm on the prior exam. A 2.9 cm left basilar nodule is seen new from the prior study. No acute infiltrate or sizable effusion is noted. Mild interstitial edema is seen. IMPRESSION: Changes consistent with the known history of metastatic lung carcinoma. The nodules in the left lung have increased in size. New interstitial changes likely representing edema although the possibility of  lymphangitic spread of carcinoma could not be totally excluded. Electronically Signed   By: Inez Catalina M.D.   On: 04/26/2016 12:03   Ct Angio Chest Pe W And/or Wo Contrast  Result Date: 04/26/2016 CLINICAL DATA:  56 year old male with stage IV lung cancer post radiation therapy completed 4 days ago. Chemotherapy to start this week. Shortness breath. Right back pain. Subsequent encounter. EXAM: CT ANGIOGRAPHY CHEST WITH CONTRAST TECHNIQUE: Multidetector CT imaging of the chest was performed using the standard protocol during bolus administration of intravenous contrast. Multiplanar CT image reconstructions and MIPs were obtained to evaluate the vascular anatomy. CONTRAST:  100 cc Isovue 370. COMPARISON:  04/26/2016 chest x-ray. 02/05/2016 chest CT. 03/02/2016 PET-CT. FINDINGS: Cardiovascular:  No pulmonary embolus or aortic dissection. Mediastinum/Nodes: Prominent mediastinal and hilar adenopathy minimally more prominent than on prior exam. Slightly enlarged right axillary lymph nodes new from prior exam. Esophagus is compressed by subcarinal adenopathy. Lungs/Pleura: Large necrotic mass filling the majority of the right lower lobe spanning over 12.6 x 9 x 12.5 cm minimally changed from prior exam when this measured 13 x 10 x 12.8 cm. This mass surrounds and narrows pulmonary arteries veins and bronchi. Several new or enlarging metastatic lesions throughout all lungs (approximately 15 in number). Index lymph node left upper lobe now with maximal dimension of 21 mm versus prior 5 mm. New mass left lower lobe measures up to 2.4 cm. Interstitial spread of tumor suspected throughout the lungs most notable right upper lobe and left lower lobe. Upper Abdomen: No adrenal lesion or liver lesion noted. Musculoskeletal: Tumor abuts the pleura adjacent to ribs but without rib destruction. Review of the MIP images confirms the above findings. IMPRESSION: No pulmonary embolus detected. Primary large necrotic tumor right  lower lobe minimally changed. Several new or enlarging metastatic lesions throughout all lungs (approximately 15 in number). Interstitial spread of tumor suspected throughout the lungs most notable right upper lobe and left lower lobe. Prominent mediastinal and hilar adenopathy minimally more prominent than on prior exam. Slightly enlarged right axillary lymph nodes new from prior exam. Electronically Signed   By: Genia Del M.D.   On: 04/26/2016 14:38    EKG: Independently reviewed.  Sinus tachy at 117  Assessment/Plan Principal Problem:   Acute respiratory failure - metastatic adenocarcinoma of the lungs - due to progression of lung CA- will increase his  steroids in attempt to alleviate some of his resp distress tonight- Nebs would  not help this- does not appear to have the flu  - cont O2 as needed-  - Chemo was planned to start on March 1st- will need to contact Dr Irene Limbo tomorrow for further input on whether he will give him chemo sooner or not give it at this point - palliative care consult to help determine plan of care- if it is decided that he will have chemo, he should have hospice at home  - bedrest only to prevent further exacerbation of hypoxia and dyspnea - currently does not complain of pain- has Norco prescribed for home but has not needed it - DNR discussed with patient   Active Problems:   Protein-calorie malnutrition, severe - Ensure  Hyponatremia - has sodium which was cronically in low 130s- 129 today - either SIADH vs dehydration - check U studies- hold off on IVF as it is a high probability he may have SIADH  Acidosis  - check LA and ABG  H/o DVT - has completed treatment  Radiation burn right post chest wall - can continue gel that was given to him by cancer center today  Smoker  - doing well with a nicotine patch   DVT prophylaxis: Lovenox Code Status: DNR  Family Communication: wife at bedside- she had left the room to move her car when code status  was discussed Disposition Plan:  Med/surg bed Consults called: Palliative care  Admission status: observation    [facility] MD [facility] Pager: [email or web address] Password [email or web address] [phone], please contact night-coverage   04/26/2016, 5:04 PM

## 2016-04-26 NOTE — ED Provider Notes (Signed)
[facility] DEPT Provider Note   CSN: [id number] Arrival date & time: 04/26/16  1110     History   Chief Complaint Chief Complaint  Patient presents with  . Shortness of Breath    HPI [name] Chapman is a 56 y.o. male.  HPI  Pt comes in with cc of DIB. PT has hx of lung CA, recent diagnosis, s/p radiation tx. Over the past 2-3 days, pt has been having severe dib. Pt has DIB even at rest, and he gets really winded with minimal exertion. No hx of PE, reportedly he has had a DVT in the past. But he is not on anticoagulation.  Past Medical History:  Diagnosis Date  . 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 Acute respiratory failure with hypoxia ([place]) 04/28/2016  . Palliative care encounter   . Acute respiratory failure ([place]) 04/26/2016  . Primary adenocarcinoma of right lung ([place]) 04/16/2016  . Goals of care, counseling/discussion 04/16/2016  . FUO (fever of unknown origin)   . S/P bronchoscopy with biopsy   . Malignant neoplasm of upper lobe of right lung ([place])   . Postobstructive pneumonia 01/30/2016  . PVD (peripheral vascular disease) ([place]) 01/30/2016  . Lumbar transverse process fracture ([place]) 01/30/2016  . Tobacco abuse 01/30/2016  . Alcohol abuse 01/30/2016  . Protein-calorie malnutrition, severe 01/28/2016  . Leg pain, inferior, left 01/27/2016  . Microcytic hypochromic anemia 01/27/2016    Past Surgical History:  Procedure Laterality Date  . VIDEO BRONCHOSCOPY Bilateral 01/29/2016   Procedure: VIDEO BRONCHOSCOPY WITH FLUORO;  Surgeon: Collene Gobble, MD;  Location: [facility];  Service: Cardiopulmonary;  Laterality: Bilateral;       Home Medications    Prior to Admission medications   Medication Sig Start Date End Date Taking? Authorizing Provider  acetaminophen (TYLENOL) 325 MG tablet Take 2 tablets (650 mg total) by mouth every 6 (six) hours as needed. 03/18/16   Yes Brunetta Genera, MD  famotidine (PEPCID) 20 MG tablet Take 1 tablet (20 mg total) by mouth daily. 03/18/16  Yes Brunetta Genera, MD  Multiple Vitamin (MULTIVITAMIN WITH MINERALS) TABS tablet Take 1 tablet by mouth daily. 02/07/16  Yes Shanker Kristeen Mans, MD  nicotine (NICODERM CQ - DOSED IN MG/24 HOURS) 14 mg/24hr patch Place 1 patch (14 mg total) onto the skin at bedtime. 03/18/16  Yes Brunetta Genera, MD  sucralfate (CARAFATE) 1 g tablet Take 1 tablet (1 g total) by mouth 4 (four) times daily -  with meals and at bedtime. 5 min before meals for radiation induced esophagitis 04/08/16  Yes [name], MD  Wound Cleansers (RADIAPLEX EX) Apply 1 application topically 2 (two) times daily.    Yes Historical Provider, MD  feeding supplement, ENSURE ENLIVE, (ENSURE ENLIVE) LIQD Take 237 mLs by mouth 3 (three) times daily between meals. 04/28/16   Clanford Marisa Hua, MD  HYDROcodone-acetaminophen (NORCO) 5-325 MG tablet Take 1 tablet by mouth every 6 (six) hours as needed for moderate pain. 04/26/16   Hayden Pedro, PA-C  ondansetron (ZOFRAN) 8 MG tablet Take 1 tablet (8 mg total) by mouth 2 (two) times daily as needed for refractory nausea / vomiting. Start on day 3 after chemo. 04/16/16   Brunetta Genera, MD  predniSONE (DELTASONE) 20 MG tablet Take 3 tabs po daily for 5 days, then take 2 tabs po daily for  5 days, then 1 tab po daily x 5d, then stop 04/28/16   Clanford Marisa Hua, MD    Family History Family History  Problem Relation Age of Onset  . CAD Father   . Cancer Neg Hx     Social History Social History  Substance Use Topics  . Smoking status: Former Smoker    Packs/day: 1.00    Years: 30.00    Types: Cigarettes    Quit date: 01/21/2016  . Smokeless tobacco: Never Used  . Alcohol use No     Comment: reports he used to drink approximately six beers per week but, stopped since diagnosis.     Allergies   Patient has no known allergies.   Review of  Systems Review of Systems  ROS 10 Systems reviewed and are negative for acute change except as noted in the HPI.     Physical Exam Updated Vital Signs BP 111/81 (BP Location: Right Arm)   Pulse 97   Temp 98.2 F (36.8 C) (Oral)   Resp (!) 22 Comment: Rn notified   Ht '5\' 6"'$  (1.676 m)   Wt 138 lb 4.8 oz (62.7 kg)   SpO2 (!) 89% Comment: Rn notified   BMI 22.32 kg/m   Physical Exam  Constitutional: He is oriented to person, place, and time.  cachectic  HENT:  Head: Normocephalic and atraumatic.  Eyes: Conjunctivae and EOM are normal. Pupils are equal, round, and reactive to light.  Neck: Normal range of motion. Neck supple.  Cardiovascular: Regular rhythm and normal heart sounds.   tachycardia  Pulmonary/Chest: Breath sounds normal. He is in respiratory distress. He has no wheezes.  Poor aeration on the r side  Abdominal: Soft. Bowel sounds are normal. He exhibits no distension. There is no tenderness. There is no rebound and no guarding.  Neurological: He is alert and oriented to person, place, and time.  Skin: Skin is warm.  Nursing note and vitals reviewed.    ED Treatments / Results  Labs (all labs ordered are listed, but only abnormal results are displayed) Labs Reviewed  CBC WITH DIFFERENTIAL/PLATELET - Abnormal; Notable for the following:       Result Value   Hemoglobin 12.8 (*)    HCT 38.3 (*)    RDW 25.8 (*)    Lymphs Abs 0.3 (*)    All other components within normal limits  COMPREHENSIVE METABOLIC PANEL - Abnormal; Notable for the following:    Sodium 129 (*)    Chloride 98 (*)    CO2 17 (*)    Glucose, Bld 113 (*)    BUN 22 (*)    Total Protein 8.3 (*)    Albumin 3.2 (*)    Alkaline Phosphatase 142 (*)    All other components within normal limits  BLOOD GAS, ARTERIAL - Abnormal; Notable for the following:    pH, Arterial 7.536 (*)    pCO2 arterial 23.2 (*)    pO2, Arterial 56.6 (*)    Bicarbonate 19.6 (*)    All other components within normal  limits  BASIC METABOLIC PANEL - Abnormal; Notable for the following:    Sodium 127 (*)    Chloride 96 (*)    Glucose, Bld 129 (*)    Calcium 8.7 (*)    All other components within normal limits  OSMOLALITY - Abnormal; Notable for the following:    Osmolality 269 (*)    All other components within normal limits  TROPONIN I  BRAIN NATRIURETIC PEPTIDE  LACTIC ACID, PLASMA  OSMOLALITY, URINE  SODIUM, URINE, RANDOM    EKG  EKG Interpretation  Date/Time:  Tuesday April 26 2016 11:52:04 EST Ventricular Rate:  117 PR Interval:    QRS Duration: 90 QT Interval:  339 QTC Calculation: 473 R Axis:   78 Text Interpretation:  Sinus tachycardia LAE, consider biatrial enlargement Anteroseptal infarct, age indeterminate Rate faster Confirmed by Wyvonnia Dusky  MD, STEPHEN [phone]) on 04/27/2016 2:49:01 PM Also confirmed by Kathrynn Humble, MD, Thelma Comp [phone])  on 04/28/2016 2:42:33 PM       Radiology No results found.  Procedures Procedures (including critical care time)  CRITICAL CARE Performed by: Varney Biles   Total critical care time: 40 minutes  Critical care time was exclusive of separately billable procedures and treating other patients.  Critical care was necessary to treat or prevent imminent or life-threatening deterioration.  Critical care was time spent personally by me on the following activities: development of treatment plan with patient and/or surrogate as well as nursing, discussions with consultants, evaluation of patient's response to treatment, examination of patient, obtaining history from patient or surrogate, ordering and performing treatments and interventions, ordering and review of laboratory studies, ordering and review of radiographic studies, pulse oximetry and re-evaluation of patient's condition.   Medications Ordered in ED Medications  sodium chloride 0.9 % injection (not administered)  iopamidol (ISOVUE-370) 76 % injection (not administered)  sodium chloride  flush (NS) 0.9 % injection 3 mL (3 mLs Intravenous Given 04/28/16 1000)  sodium chloride flush (NS) 0.9 % injection 3 mL (3 mLs Intravenous Given 04/27/16 0015)  0.9 %  sodium chloride infusion (not administered)  ondansetron (ZOFRAN) tablet 4 mg (not administered)    Or  ondansetron (ZOFRAN) injection 4 mg (not administered)  enoxaparin (LOVENOX) injection 40 mg (40 mg Subcutaneous Given 04/27/16 2111)  acetaminophen (TYLENOL) tablet 650 mg (not administered)  nicotine (NICODERM CQ - dosed in mg/24 hr) patch 7 mg (7 mg Transdermal Patch Applied 04/28/16 0956)  famotidine (PEPCID) tablet 10 mg (10 mg Oral Given 04/28/16 0954)  HYDROcodone-acetaminophen (NORCO/VICODIN) 5-325 MG per tablet 1 tablet (1 tablet Oral Given 04/27/16 2115)  dexamethasone (DECADRON) injection 5 mg (5 mg Intravenous Given 04/28/16 0955)  feeding supplement (ENSURE ENLIVE) (ENSURE ENLIVE) liquid 237 mL (237 mLs Oral Given 04/28/16 0956)  multivitamin with minerals tablet 1 tablet (1 tablet Oral Given 04/28/16 0955)  iopamidol (ISOVUE-370) 76 % injection 100 mL (100 mLs Intravenous Contrast Given 04/26/16 1353)  cyanocobalamin ((VITAMIN B-12)) injection 1,000 mcg (1,000 mcg Subcutaneous Given 04/27/16 1722)     Initial Impression / Assessment and Plan / ED Course  I have reviewed the triage vital signs and the nursing notes.  Pertinent labs & imaging results that were available during my care of the patient were reviewed by me and considered in my medical decision making (see chart for details).     Pt comes in with cc of DIB. He is in acute resp failure with hypoxia. Pt has hx of recently diagnosed CA. Witbh new O2 requirement, concerns for post obstructive PNA or PE. CT PE ordered, neg for acute process. We will admit. Goals of care discussion initiated - pt is DNR/DNI. He needs further palliative involvement - so consult placed.  Final Clinical Impressions(s) / ED Diagnoses   Final diagnoses:  Acute respiratory  failure with hypoxia [facility])    New Prescriptions Discharge Medication List as of 04/28/2016  2:01 PM    START taking these medications   Details  predniSONE (  DELTASONE) 20 MG tablet Take 3 tabs po daily for 5 days, then take 2 tabs po daily for 5 days, then 1 tab po daily x 5d, then stop, Normal         Varney Biles, MD 04/28/16 1444

## 2016-04-27 DIAGNOSIS — Z720 Tobacco use: Secondary | ICD-10-CM | POA: Diagnosis not present

## 2016-04-27 DIAGNOSIS — R630 Anorexia: Secondary | ICD-10-CM | POA: Diagnosis not present

## 2016-04-27 DIAGNOSIS — D509 Iron deficiency anemia, unspecified: Secondary | ICD-10-CM | POA: Diagnosis present

## 2016-04-27 DIAGNOSIS — D649 Anemia, unspecified: Secondary | ICD-10-CM | POA: Diagnosis not present

## 2016-04-27 DIAGNOSIS — J439 Emphysema, unspecified: Secondary | ICD-10-CM | POA: Diagnosis present

## 2016-04-27 DIAGNOSIS — E43 Unspecified severe protein-calorie malnutrition: Secondary | ICD-10-CM

## 2016-04-27 DIAGNOSIS — Z86718 Personal history of other venous thrombosis and embolism: Secondary | ICD-10-CM | POA: Diagnosis not present

## 2016-04-27 DIAGNOSIS — R0602 Shortness of breath: Secondary | ICD-10-CM | POA: Diagnosis present

## 2016-04-27 DIAGNOSIS — E872 Acidosis: Secondary | ICD-10-CM | POA: Diagnosis present

## 2016-04-27 DIAGNOSIS — C3411 Malignant neoplasm of upper lobe, right bronchus or lung: Secondary | ICD-10-CM | POA: Diagnosis present

## 2016-04-27 DIAGNOSIS — J9601 Acute respiratory failure with hypoxia: Principal | ICD-10-CM

## 2016-04-27 DIAGNOSIS — Z66 Do not resuscitate: Secondary | ICD-10-CM | POA: Diagnosis present

## 2016-04-27 DIAGNOSIS — I745 Embolism and thrombosis of iliac artery: Secondary | ICD-10-CM | POA: Diagnosis present

## 2016-04-27 DIAGNOSIS — Z79899 Other long term (current) drug therapy: Secondary | ICD-10-CM | POA: Diagnosis not present

## 2016-04-27 DIAGNOSIS — Z515 Encounter for palliative care: Secondary | ICD-10-CM | POA: Diagnosis not present

## 2016-04-27 DIAGNOSIS — E222 Syndrome of inappropriate secretion of antidiuretic hormone: Secondary | ICD-10-CM | POA: Diagnosis present

## 2016-04-27 DIAGNOSIS — Z7952 Long term (current) use of systemic steroids: Secondary | ICD-10-CM | POA: Diagnosis not present

## 2016-04-27 DIAGNOSIS — D638 Anemia in other chronic diseases classified elsewhere: Secondary | ICD-10-CM | POA: Diagnosis present

## 2016-04-27 DIAGNOSIS — Z87891 Personal history of nicotine dependence: Secondary | ICD-10-CM | POA: Diagnosis not present

## 2016-04-27 DIAGNOSIS — Z923 Personal history of irradiation: Secondary | ICD-10-CM | POA: Diagnosis not present

## 2016-04-27 LAB — BLOOD GAS, ARTERIAL
ACID-BASE DEFICIT: 1.4 mmol/L (ref 0.0–2.0)
Bicarbonate: 19.6 mmol/L — ABNORMAL LOW (ref 20.0–28.0)
O2 CONTENT: 4 L/min
O2 SAT: 90.1 %
PCO2 ART: 23.2 mmHg — AB (ref 32.0–48.0)
PO2 ART: 56.6 mmHg — AB (ref 83.0–108.0)
Patient temperature: 98.6
pH, Arterial: 7.536 — ABNORMAL HIGH (ref 7.350–7.450)

## 2016-04-27 LAB — BASIC METABOLIC PANEL
Anion gap: 9 (ref 5–15)
BUN: 14 mg/dL (ref 6–20)
CHLORIDE: 96 mmol/L — AB (ref 101–111)
CO2: 22 mmol/L (ref 22–32)
CREATININE: 0.69 mg/dL (ref 0.61–1.24)
Calcium: 8.7 mg/dL — ABNORMAL LOW (ref 8.9–10.3)
GFR calc Af Amer: >60 mL/min (ref >60)
GFR calc non Af Amer: >60 mL/min (ref >60)
Glucose, Bld: 129 mg/dL — ABNORMAL HIGH (ref 65–99)
Potassium: 4.4 mmol/L (ref 3.5–5.1)
SODIUM: 127 mmol/L — AB (ref 135–145)

## 2016-04-27 MED ORDER — ADULT MULTIVITAMIN W/MINERALS CH
1.0000 | ORAL_TABLET | Freq: Every day | ORAL | Status: DC
  Administered 2016-04-27 – 2016-04-28 (×2): 1 via ORAL
  Filled 2016-04-27 (×2): qty 1

## 2016-04-27 MED ORDER — DEXAMETHASONE SODIUM PHOSPHATE 10 MG/ML IJ SOLN
5.0000 mg | Freq: Two times a day (BID) | INTRAMUSCULAR | Status: DC
  Administered 2016-04-27 – 2016-04-28 (×2): 5 mg via INTRAVENOUS
  Filled 2016-04-27 (×2): qty 0.5

## 2016-04-27 MED ORDER — ENSURE ENLIVE PO LIQD
237.0000 mL | Freq: Three times a day (TID) | ORAL | Status: DC
  Administered 2016-04-27 – 2016-04-28 (×3): 237 mL via ORAL

## 2016-04-27 MED ORDER — CYANOCOBALAMIN 1000 MCG/ML IJ SOLN
1000.0000 ug | Freq: Once | INTRAMUSCULAR | Status: AC
  Administered 2016-04-27: 1000 ug via SUBCUTANEOUS
  Filled 2016-04-27: qty 1

## 2016-04-27 NOTE — Progress Notes (Signed)
[name]   HEMATOLOGY/ONCOLOGY INPATIENT PROGRESS NOTE  Date of Service: 04/27/2016  Inpatient Attending: .Clanford Marisa Hua, MD   SUBJECTIVE  Mr Jensen was admitted from the radiation oncology clinic with increasing shortness of breath and hypoxia. His right lung main mass is slightly smaller but a CTA of the chest shows progressive disease with multiple new lung lesions. He notes that his breathing is better with the oxygen. He has been eating well and his albumin levels and overall nutritional status and anemia has improved. We discussed in details his goals of care and the option to pursue best supportive cares through hospice versus consideration of palliative chemotherapy as previously planned starting on 05/05/2016.  Patient notes that he would want to consider palliative chemotherapy. We have ordered vitamin B12 subcutaneous dose as chemotherapy premedication. My nurses going to try to see if he can get his chemotherapy counseling scheduled for Friday as inpatient to save him a visit to the clinic.  OBJECTIVE:  NAD  PHYSICAL EXAMINATION: . Vitals:   04/26/16 1803 04/26/16 1915 04/26/16 2057 04/27/16 0553  BP: 120/81 117/80 111/72 105/73  Pulse: (!) 130 (!) 132 (!) 129 (!) 108  Resp: (!) 28 (!) '26 19 19  ' Temp:  (!) 100.4 F (38 C) 99.8 F (37.7 C) 99.4 F (37.4 C)  TempSrc:  Oral Oral Oral  SpO2: (!) 87% 92% 91% 90%  Weight:  138 lb 4.8 oz (62.7 kg)    Height:  '5\' 6"'  (1.676 m)     Filed Weights   04/26/16 1142 04/26/16 1915  Weight: 143 lb (64.9 kg) 138 lb 4.8 oz (62.7 kg)   .Body mass index is 22.32 kg/m.  GENERAL:alert, in no acute distress and comfortable SKIN: No acute rashes, does have a radiation related skin injury on his right back. EYES: normal, conjunctiva are pink and non-injected, sclera clear OROPHARYNX:no exudate, no erythema and lips, buccal mucosa, and tongue normal  NECK: supple, no JVD, thyroid normal size, non-tender, without nodularity LYMPH:  no  palpable lymphadenopathy in the cervical, axillary or inguinal LUNGS: Few scattered rales decreased air entry right midzone HEART: regular rate & rhythm,  no murmurs and no lower extremity edema ABDOMEN: abdomen soft, non-tender, normoactive bowel sounds  Musculoskeletal: no cyanosis of digits and no clubbing  PSYCH: alert & oriented x 3 with fluent speech NEURO: no focal motor/sensory deficits  MEDICAL HISTORY:  Past Medical History:  Diagnosis Date  . 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 VIDEO BRONCHOSCOPY Bilateral 01/29/2016   Procedure: VIDEO BRONCHOSCOPY WITH FLUORO;  Surgeon: Collene Gobble, MD;  Location: [facility];  Service: Cardiopulmonary;  Laterality: Bilateral;    SOCIAL HISTORY: Social History   Social History  . Marital status: Single    Spouse name: N/A  . Number of children: N/A  . Years of education: N/A   Occupational History  . Not on file.   Social History Main Topics  . Smoking status: Former Smoker    Packs/day: 1.00    Years: 30.00    Types: Cigarettes    Quit date: 01/21/2016  . Smokeless tobacco: Never Used  . Alcohol use No     Comment: reports he used to drink approximately six beers per week but, stopped since diagnosis.  . Drug use: No  . Sexual activity: Not Currently   Other Topics Concern  . Not on  file   Social History Narrative  . No narrative on file    FAMILY HISTORY: Family History  Problem Relation Age of Onset  . CAD Father   . Cancer Neg Hx     ALLERGIES:  has No Known Allergies.  MEDICATIONS:  Scheduled Meds: . dexamethasone  5 mg Intravenous Q12H  . enoxaparin (LOVENOX) injection  40 mg Subcutaneous Q24H  . famotidine  10 mg Oral BID  . nicotine  7 mg Transdermal Daily  . sodium chloride flush  3 mL Intravenous Q12H   Continuous Infusions: PRN Meds:.sodium chloride, acetaminophen,  HYDROcodone-acetaminophen, ondansetron **OR** ondansetron (ZOFRAN) IV, sodium chloride flush  REVIEW OF SYSTEMS:    10 Point review of Systems was done is negative except as noted above.   LABORATORY DATA:  I have reviewed the data as listed  . CBC Latest Ref Rng & Units 04/26/2016 04/26/2016 04/15/2016  WBC 4.0 - 10.5 K/uL 5.6 5.9 10.6(H)  Hemoglobin 13.0 - 17.0 g/dL 12.8(L) 12.3(L) 11.2(L)  Hematocrit 39.0 - 52.0 % 38.3(L) 37.0(L) 33.7(L)  Platelets 150 - 400 K/uL 277 310 248   CBC    Component Value Date/Time   WBC 5.6 04/26/2016 1200   RBC 4.88 04/26/2016 1200   HGB 12.8 (L) 04/26/2016 1200   HGB 12.3 (L) 04/26/2016 0904   HCT 38.3 (L) 04/26/2016 1200   HCT 37.0 (L) 04/26/2016 0904   PLT 277 04/26/2016 1200   PLT 310 04/26/2016 0904   MCV 78.5 04/26/2016 1200   MCV 79.1 (L) 04/26/2016 0904   MCH 26.2 04/26/2016 1200   MCHC 33.4 04/26/2016 1200   RDW 25.8 (H) 04/26/2016 1200   RDW 29.2 (H) 04/26/2016 0904   LYMPHSABS 0.3 (L) 04/26/2016 1200   LYMPHSABS 0.3 (L) 04/26/2016 0904   MONOABS 0.2 04/26/2016 1200   MONOABS 0.3 04/26/2016 0904   EOSABS 0.1 04/26/2016 1200   EOSABS 0.0 04/26/2016 0904   BASOSABS 0.0 04/26/2016 1200   BASOSABS 0.0 04/26/2016 0904    CMP Latest Ref Rng & Units 04/27/2016 04/26/2016 04/15/2016  Glucose 65 - 99 mg/dL 129(H) 113(H) 107  BUN 6 - 20 mg/dL 14 22(H) 20.3  Creatinine 0.61 - 1.24 mg/dL 0.69 0.90 0.6(L)  Sodium 135 - 145 mmol/L 127(L) 129(L) 133(L)  Potassium 3.5 - 5.1 mmol/L 4.4 4.3 3.9  Chloride 101 - 111 mmol/L 96(L) 98(L) -  CO2 22 - 32 mmol/L 22 17(L) 23  Calcium 8.9 - 10.3 mg/dL 8.7(L) 9.1 9.9  Total Protein 6.5 - 8.1 g/dL - 8.3(H) 8.6(H)  Total Bilirubin 0.3 - 1.2 mg/dL - 0.9 0.34  Alkaline Phos 38 - 126 U/L - 142(H) 248(H)  AST 15 - 41 U/L - 40 37(H)  ALT 17 - 63 U/L - 35 64(H)     RADIOGRAPHIC STUDIES: I have personally reviewed the radiological images as listed and agreed with the findings in the report. Dg Chest 2  View  Result Date: 04/26/2016 CLINICAL DATA:  Increasing shortness of breath and cough over the last few days EXAM: CHEST  2 VIEW COMPARISON:  03/02/2016 FINDINGS: Persistent right lower lobe mass lesion is seen. Nodules are noted bilaterally consistent with metastatic disease. The left upper lobe nodule measures 2.5 cm increased in size from 9 mm on the prior exam. A 2.9 cm left basilar nodule is seen new from the prior study. No acute infiltrate or sizable effusion is noted. Mild interstitial edema is seen. IMPRESSION: Changes consistent with the known history of metastatic lung carcinoma. The nodules  in the left lung have increased in size. New interstitial changes likely representing edema although the possibility of lymphangitic spread of carcinoma could not be totally excluded. Electronically Signed   By: Inez Catalina M.D.   On: 04/26/2016 12:03   Ct Angio Chest Pe W And/or Wo Contrast  Result Date: 04/26/2016 CLINICAL DATA:  56 year old male with stage IV lung cancer post radiation therapy completed 4 days ago. Chemotherapy to start this week. Shortness breath. Right back pain. Subsequent encounter. EXAM: CT ANGIOGRAPHY CHEST WITH CONTRAST TECHNIQUE: Multidetector CT imaging of the chest was performed using the standard protocol during bolus administration of intravenous contrast. Multiplanar CT image reconstructions and MIPs were obtained to evaluate the vascular anatomy. CONTRAST:  100 cc Isovue 370. COMPARISON:  04/26/2016 chest x-ray. 02/05/2016 chest CT. 03/02/2016 PET-CT. FINDINGS: Cardiovascular:  No pulmonary embolus or aortic dissection. Mediastinum/Nodes: Prominent mediastinal and hilar adenopathy minimally more prominent than on prior exam. Slightly enlarged right axillary lymph nodes new from prior exam. Esophagus is compressed by subcarinal adenopathy. Lungs/Pleura: Large necrotic mass filling the majority of the right lower lobe spanning over 12.6 x 9 x 12.5 cm minimally changed from prior  exam when this measured 13 x 10 x 12.8 cm. This mass surrounds and narrows pulmonary arteries veins and bronchi. Several new or enlarging metastatic lesions throughout all lungs (approximately 15 in number). Index lymph node left upper lobe now with maximal dimension of 21 mm versus prior 5 mm. New mass left lower lobe measures up to 2.4 cm. Interstitial spread of tumor suspected throughout the lungs most notable right upper lobe and left lower lobe. Upper Abdomen: No adrenal lesion or liver lesion noted. Musculoskeletal: Tumor abuts the pleura adjacent to ribs but without rib destruction. Review of the MIP images confirms the above findings. IMPRESSION: No pulmonary embolus detected. Primary large necrotic tumor right lower lobe minimally changed. Several new or enlarging metastatic lesions throughout all lungs (approximately 15 in number). Interstitial spread of tumor suspected throughout the lungs most notable right upper lobe and left lower lobe. Prominent mediastinal and hilar adenopathy minimally more prominent than on prior exam. Slightly enlarged right axillary lymph nodes new from prior exam. Electronically Signed   By: Genia Del M.D.   On: 04/26/2016 14:38    ASSESSMENT & PLAN:   56 yo with   1) increase in shortness of breath and hypoxemic respiratory failure. This appears to be primarily related to some disease progression. No overt evidence of a pneumonia. No evidence of pulmonary embolism. Has limited pulmonary reserve at baseline due to emphysema/COPD from his extensive smoking history. Could have an element of radiation pneumonitis. 2)Stage IV ([id number]) Rt Lung large lung biopsy proven lung adenocarcinoma with  concern for pleural thickening and pleural involvement, MRI brain neg for brain mets. EGFR, ALK, ROS-1, BRAF neg PDL1 status- 10% PET/CT results noted CT of the chest shows disease progression. Completed palliative radiation therapy in 04/21/2016. 3) Emphysema 4)  Cigarette smoking (has quit for the last several weeks - currently using nicotine patch) Plan -Reasonable to start the patient on prednisone 60 mg by mouth daily for short one-week first to treat any elements of COPD exacerbation or radiation pneumonitis with a taper over one week after that. -After extensive discussion of goals of care the patient would like to proceed with palliative chemotherapy as planned from 03/04/2017. He has a clinic visit with me on the same day with labs. -given his PDL1 <50% and in the absence targetable mutations for  his lung cancer we discussed and he is agreeable to pursue chemotherapy with platinum doublet(carboplatin + Alimta with G-CSF support) about 2 weeks after completion of palliative RT .Marland KitchenMarland Kitchen Plan to start on 05/05/2016 has been set up already. -Would likely need home oxygen on discharge -Would be reasonable to consider home palliative care referral.   5) Microcytic Anemia Likely predominantly anemia of chronic disease from lung cancer. Significantly elevated inflammatory markers. Also has elevated rheumatoid factor levels ?RA. No overt focal joint symptoms at this time. Improved after transfusion.    6) Left common iliac artery occlusion. 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 -likely related to PAD due to smoking. -outpatient f/u with vascular surgery - on ASA per their recommendation. No intervention planned. -Continued smoking cessation  6) Compression acute vs subacute fracture of rt L3 and L4 transverse processes  -pain mx. Pain is currently controlled   7) Smoking -has quit  about a month ago -continue nicotine patch  8) Anorexia likely due to malignancy. Significant improvement in appetite with Dexamethasone with good po intake. weigt stable. Improved serum albumin levels  I spent 30 minutes counseling the patient face to face. The total time spent in the appointment was 45 minutes and more than 50% was on counseling and direct patient  cares.    Sullivan Lone MD [place] AAHIVMS [facility] Hematology/Oncology Physician [facility]  (Office):       [phone] (Work cell):  [phone] (Fax):           [phone]  04/27/2016 10:16 AM

## 2016-04-27 NOTE — Progress Notes (Addendum)
Initial Nutrition Assessment  DOCUMENTATION CODES:   Severe malnutrition in context of chronic illness  INTERVENTION:   Ensure Enlive po BID, each supplement provides 350 kcal and 20 grams of protein  Snacks  MVI  NUTRITION DIAGNOSIS:   Malnutrition related to cancer and cancer related treatments as evidenced by 6 percent weight loss in 2 weeks, severe depletion of body fat, severe depletion of muscle mass.  GOAL:   Patient will meet greater than or equal to 90% of their needs  MONITOR:   PO intake, Supplement acceptance, Weight trends, Labs, I & O's  REASON FOR ASSESSMENT:   Malnutrition Screening Tool    ASSESSMENT:   56 y.o. 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   Met with pt in room today. Pt reports poor appetite and oral intake for 2 days pta but reports that he hasn't been eating normally since his cancer diagnosis. Per chart, pt has lost 8lbs(6%) in two weeks. Pt does report that he has been losing weight over the past couple of weeks. This is significant given history. Pt with radiation burn on chest. Pt also on steroids. 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 Pt on free water restriction. Pt likes chocolate Ensure. Possible plan is for pt to go home with hospice.   Medications reviewed and include: dexamethasone, lovenox, pepcid, nicotine, NaCl flushes  Labs reviewed: Na 127(L), Cl 96(L), Ca 8.7(L) adj. 9.5 wnl, AkPhos 142(H), Alb 3.2(L)  Nutrition-Focused physical exam completed. Findings are severe fat depletion, severe muscle depletion, and no edema.   Diet Order:  Diet regular Room service appropriate? Yes; Fluid consistency: Thin; Fluid restriction: 1200 mL Fluid  Skin:   Reviewed, no issues  Last BM:  none since admit  Height:   Ht Readings from Last 1 Encounters:  04/26/16 _0  (1.676 m)    Weight:   Wt Readings from Last 1 Encounters:  04/26/16 138 lb 4.8 oz (62.7 kg)    Ideal Body Weight:  64.5 kg  BMI:  Body mass index is 22.32 kg/m.  Estimated Nutritional Needs:   Kcal:  1800-2100kcal/day   Protein:  94-107g/day   Fluid:  >1.8L/day   EDUCATION NEEDS:   Education needs no appropriate at this time  Koleen Distance, RD, [place] Pager #[phone]

## 2016-04-27 NOTE — Care Management Note (Signed)
Case Management Note  Patient Details  Name: [name] Chapman MRN: [id number] Date of Birth: Apr 10, 1960  Subjective/Objective:  56 y.o. M with R sided lung Adeno Carcinoma. Awaiting Palliative Consult and PT consult for this pt and his wife. CM will be available to help in any way possible.                   Action/Plan:CM will follow closely for disposition/discharge needs.    Expected Discharge Date:   (unknown)               Expected Discharge Plan:     In-House Referral:     Discharge planning Services  CM Consult  Post Acute Care Choice:    Choice offered to:     DME Arranged:    DME Agency:     HH Arranged:    HH Agency:     Status of Service:  In process, will continue to follow  If discussed at Long Length of Stay Meetings, dates discussed:    Additional Comments:  Delrae Sawyers, RN 04/27/2016, 11:54 AM

## 2016-04-27 NOTE — Progress Notes (Addendum)
PROGRESS NOTE    [name] Chapman  JKD:[id number]  DOB: 1960/09/07  DOA: 04/26/2016 PCP: Dimas Chyle, MD Outpatient Specialists:   Hospital course: [name] Chapman is a 57 y.o. 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 He is found to have a pulse ox of < 88% on ambulation. He states he has been short of breath for about 4-5 days now which is worse with exertion. He does not have a fever, cough or chills. No one sick at home.     Assessment & Plan:    Acute respiratory failure - metastatic adenocarcinoma of the lungs - due to progression of lung CA- will increase his steroids in attempt to alleviate some of his resp distress tonight- Nebs would  not help this- does not appear to have the flu  - cont O2 as needed- likely will need oxygen at home - Chemo was planned to start on March 1st- I called and requested consult from Dr. 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 Irene Limbo)  Subjective: Pt says he feels better with supplemental oxygen  Objective: Vitals:   04/26/16 1803 04/26/16 1915 04/26/16 2057 04/27/16 0553  BP: 120/81 117/80 111/72 105/73  Pulse: (!) 130 (!) 132 (!) 129 (!) 108  Resp: (!) 28 (!) '26 19 19  '$ Temp:  (!) 100.4 F (38 C) 99.8 F (37.7 C) 99.4 F (37.4 C)  TempSrc:  Oral Oral Oral  SpO2: (!) 87% 92% 91% 90%  Weight:  62.7 kg (138 lb 4.8 oz)    Height:  '5\' 6"'$  (1.676 m)      Intake/Output Summary (Last 24 hours) at 04/27/16 0953 Last data filed at 04/27/16 0700  Gross per 24 hour  Intake              270 ml  Output             1200 ml  Net             -930 ml   Filed Weights   04/26/16 1142 04/26/16 1915  Weight: 64.9 kg (143 lb) 62.7 kg (138 lb 4.8 oz)    Exam:  General exam: thin emaciated male, NAD, cooperative.  Respiratory system:  No increased work of breathing. Cardiovascular system: S1 & S2 heard, tachycardic.  Gastrointestinal system: Abdomen is nondistended, soft and nontender. Normal bowel sounds heard. Central nervous system: Alert and oriented. No focal neurological deficits.  Extremities: no cyanosis.  Data Reviewed: Basic Metabolic Panel:  Recent Labs Lab 04/26/16 1200 04/27/16 0613  NA 129* 127*  K 4.3 4.4  CL 98* 96*  CO2 17* 22  GLUCOSE 113* 129*  BUN 22* 14  CREATININE 0.90 0.69  CALCIUM 9.1 8.7*   Liver Function Tests:  Recent Labs Lab 04/26/16 1200  AST 40  ALT 35  ALKPHOS 142*  BILITOT 0.9  PROT 8.3*  ALBUMIN 3.2*   No results for input(s): LIPASE, AMYLASE in the last 168 hours. No results for input(s): AMMONIA in the last 168 hours. CBC:  Recent Labs Lab 04/26/16 0904 04/26/16 1200  WBC 5.9 5.6  NEUTROABS 5.3 5.0  HGB 12.3* 12.8*  HCT 37.0* 38.3*  MCV 79.1* 78.5  PLT 310 277   Cardiac Enzymes:  Recent Labs Lab 04/26/16 1200  TROPONINI <0.03   CBG (last 3)  No results for input(s): GLUCAP  in the last 72 hours. No results found for this or any previous visit (from the past 240 hour(s)).   Studies: Dg Chest 2 View  Result Date: 04/26/2016 CLINICAL DATA:  Increasing shortness of breath and cough over the last few days EXAM: CHEST  2 VIEW COMPARISON:  03/02/2016 FINDINGS: Persistent right lower lobe mass lesion is seen. Nodules are noted bilaterally consistent with metastatic disease. The left upper lobe nodule measures 2.5 cm increased in size from 9 mm on the prior exam. A 2.9 cm left basilar nodule is seen new from the prior study. No acute infiltrate or sizable effusion is noted. Mild interstitial edema is seen. IMPRESSION: Changes consistent with the known history of metastatic lung carcinoma. The nodules in the left lung have increased in size. New interstitial changes likely representing edema although the possibility of lymphangitic spread of carcinoma could not be totally excluded. Electronically Signed   By: Inez Catalina M.D.   On: 04/26/2016 12:03   Ct Angio Chest Pe W And/or Wo Contrast  Result Date: 04/26/2016 CLINICAL DATA:  56 year old male with stage IV lung cancer post radiation therapy completed 4 days ago. Chemotherapy to start this week. Shortness breath. Right back pain. Subsequent encounter. EXAM: CT ANGIOGRAPHY CHEST WITH CONTRAST TECHNIQUE: Multidetector CT imaging of the chest was performed using the standard protocol during bolus administration of intravenous contrast. Multiplanar CT image reconstructions and MIPs were obtained to evaluate the vascular anatomy. CONTRAST:  100 cc Isovue 370. COMPARISON:  04/26/2016 chest x-ray. 02/05/2016 chest CT. 03/02/2016 PET-CT. FINDINGS: Cardiovascular:  No pulmonary embolus or aortic dissection. Mediastinum/Nodes: Prominent mediastinal and hilar adenopathy minimally more prominent than on prior exam. Slightly enlarged right axillary lymph nodes new from prior exam. Esophagus is compressed by subcarinal adenopathy. Lungs/Pleura:  Large necrotic mass filling the majority of the right lower lobe spanning over 12.6 x 9 x 12.5 cm minimally changed from prior exam when this measured 13 x 10 x 12.8 cm. This mass surrounds and narrows pulmonary arteries veins and bronchi. Several new or enlarging metastatic lesions throughout all lungs (approximately 15 in number). Index lymph node left upper lobe now with maximal dimension of 21 mm versus prior 5 mm. New mass left lower lobe measures up to 2.4 cm. Interstitial spread of tumor suspected throughout the lungs most notable right upper lobe and left lower lobe. Upper Abdomen: No adrenal lesion or liver lesion noted. Musculoskeletal: Tumor abuts the pleura adjacent to ribs but without rib destruction. Review of the MIP images confirms the above findings. IMPRESSION: No pulmonary embolus detected. Primary large  necrotic tumor right lower lobe minimally changed. Several new or enlarging metastatic lesions throughout all lungs (approximately 15 in number). Interstitial spread of tumor suspected throughout the lungs most notable right upper lobe and left lower lobe. Prominent mediastinal and hilar adenopathy minimally more prominent than on prior exam. Slightly enlarged right axillary lymph nodes new from prior exam. Electronically Signed   By: Genia Del M.D.   On: 04/26/2016 14:38   Scheduled Meds: . dexamethasone  10 mg Intravenous Q6H  . enoxaparin (LOVENOX) injection  40 mg Subcutaneous Q24H  . famotidine  10 mg Oral BID  . nicotine  7 mg Transdermal Daily  . sodium chloride flush  3 mL Intravenous Q12H   Continuous Infusions:  Principal Problem:   Acute respiratory failure (HCC) Active Problems:   Protein-calorie malnutrition, severe   Malignant neoplasm of upper lobe of right lung ([place])  Time spent:   Irwin Brakeman, MD, FAAFP [facility] Pager [phone]  If 7PM-7AM, please contact night-coverage [email or web address] Password [email or web address] 04/27/2016, 9:53 AM    LOS: 0 days

## 2016-04-28 ENCOUNTER — Encounter: Payer: Self-pay | Admitting: *Deleted

## 2016-04-28 DIAGNOSIS — Z515 Encounter for palliative care: Secondary | ICD-10-CM

## 2016-04-28 DIAGNOSIS — J9601 Acute respiratory failure with hypoxia: Secondary | ICD-10-CM | POA: Diagnosis present

## 2016-04-28 LAB — SODIUM, URINE, RANDOM: Sodium, Ur: <10 mmol/L

## 2016-04-28 LAB — OSMOLALITY, URINE: Osmolality, Ur: 746 mOsm/kg (ref 300–900)

## 2016-04-28 MED ORDER — PREDNISONE 20 MG PO TABS: ORAL_TABLET | ORAL | 0 refills | Status: AC

## 2016-04-28 MED ORDER — ENSURE ENLIVE PO LIQD: 237.0000 mL | Freq: Three times a day (TID) | ORAL | 0 refills | Status: AC

## 2016-04-28 NOTE — Discharge Instructions (Signed)
Chronic Respiratory Failure Respiratory failure is when your lungs are not working well and your breathing (respiratory) system fails. When respiratory failure occurs, it is difficult for your lungs to get enough oxygen or get rid of carbon dioxide or both. Respiratory failure can be life threatening. Respiratory failure can be acute or chronic. Acute respiratory failure is sudden, severe, and requires emergency medical treatment. Chronic respiratory failure is less severe, happens over time, and requires ongoing treatment. What are the causes? Any problem affecting the heart or lungs can cause respiratory failure. Some of these causes may be:  Chronic bronchitis and emphysema (COPD).  Blood clot going to the lung (pulmonary embolism).  Having water in the lungs caused by heart failure, lung injury, or infection (pulmonary edema).  Collapsed lung (pneumothorax).  Pneumonia.  Pulmonary fibrosis.  Obesity.  Asthma.  Heart failure.  Any type of trauma to the chest that can make breathing difficult.  Nerve or muscle diseases making chest movements difficult. What are the signs or symptoms? Signs and symptoms of chronic respiratory failure include:  Shortness of breath (dyspnea) with or without activity.  Rapid, fast breathing (tachypnea).  Wheezing.  Fast heart rate.  Bluish color to the fingernail or toenail beds.  Confusion or drowsiness or both. How is this diagnosed? Initial diagnosis requires a thorough history and a physical exam by your health care provider. Additional tests may include:  Chest X-ray.  CT scan of your lungs.  Ultrasound to check for blood clots.  Blood tests, such as an arterial blood gas test (ABG). This is a blood test that looks at the oxygen and carbon dioxide levels in your arterial blood.  Your vital signs will be taken. This includes your respiratory rate (how many times a minute you are breathing), oxygen saturation (this measures the  oxygen level in your blood), heart rate, and blood pressure. These numbers help your health care provider determine the next steps.  Electrocardiogram. How is this treated? Treatment of chronic respiratory failure depends on the cause of the respiratory failure. Treatment can include the following:  Oxygen. Oxygen can be delivered through the following:  Nasal cannula. This is small tubing that goes in your nose to give you oxygen.  Face mask. A face mask covers your nose and mouth to give you oxygen.  Medicine. Different medicines can be given to help with breathing. These can include:  Nebulizers. Nebulizers deliver medicines to open the air passages (bronchodilators). These medicines help to open or relax the airways in the lungs so you can breathe better. They can also help loosen mucus from your lungs.  Diuretics. Diuretic medicines can help you breathe better by getting rid of extra fluid in your body.  Steroids. Steroid medicines can help decrease inflammation in your lungs.  Chest tube. If you have a collapsed lung (pneumothorax), a chest tube is placed to help reinflate the lung.  Noninvasive positive pressure ventilation (NPPV). This is a tight-fitting mask that goes over your nose and mouth. The mask has tubing that is attached to a machine. The machine blows air into the tubing, which helps to keep the tiny air sacs (alveoli) in your lungs open. This machine allows you to breathe on your own.  Ventilator. A ventilator is a breathing machine. When on a ventilator, a breathing tube is put into the lungs. A ventilator is used when you can no longer breathe well enough on your own. You may have low oxygen levels or high carbon dioxide (CO2) levels  in your blood. When you are on a ventilator, sedation and pain medicines are given to make you sleep so your lungs can heal. Follow these instructions at home:  Follow your health care provider's directions about medicines and respiratory  therapy.  Quit smoking if you smoke. Contact a health care provider if:  You have increasing shortness of breath and are less functional than you have been.  You have increased sputum, wheezing, coughing, or loss of energy.  You are on oxygen and are requiring more. Get help right away if:  Your shortness of breath is significantly worse.  You are unable to say more than a few words without having to catch your breath.  You are much less functional. This information is not intended to replace advice given to you by your health care provider. Make sure you discuss any questions you have with your health care provider. Document Released: 02/28/2005 Document Revised: 08/06/2015 Document Reviewed: 12/27/2012 Elsevier Interactive Patient Education  2017 [place] Lung cancer occurs when abnormal cells in the lung grow out of control and form a mass (tumor). There are several types of lung cancer. The two most common types are:  Non-small cell. In this type of lung cancer, abnormal cells are larger and grow more slowly than those of small cell lung cancer.  Small cell. In this type of lung cancer, abnormal cells are smaller than those of non-small cell lung cancer. Small cell lung cancer gets worse faster than non-small cell lung cancer. What are the causes? The leading cause of lung cancer is smoking tobacco. The second leading cause is radon exposure. What increases the risk?  Smoking tobacco.  Exposure to secondhand tobacco smoke.  Exposure to radon gas.  Exposure to asbestos.  Exposure to arsenic in drinking water.  Air pollution.  Family or personal history of lung cancer.  Lung radiation therapy.  Being older than 85 years. What are the signs or symptoms? In the early stages, symptoms may not be present. As the cancer progresses, symptoms may include:  A lasting cough, possibly with blood.  Fatigue.  Unexplained weight loss.  Shortness of  breath.  Wheezing.  Chest pain.  Loss of appetite. Symptoms of advanced lung cancer include:  Hoarseness.  Bone or joint pain.  Weakness.  Nail problems.  Face or arm swelling.  Paralysis of the face.  Drooping eyelids. How is this diagnosed? Lung cancer can be identified with a physical exam and with tests such as:  A chest X-ray.  A CT scan.  Blood tests.  A biopsy. After a diagnosis is made, you will have more tests to determine the stage of the cancer. The stages of non-small cell lung cancer are:  Stage 0, also called carcinoma in situ. At this stage, abnormal cells are found in the inner lining of your lung or lungs.  Stage I. At this stage, abnormal cells have grown into a tumor that is no larger than 5 cm across. The cancer has entered the deeper lung tissue but has not yet entered the lymph nodes or other parts of the body.  Stage II. At this stage, the tumor is 7 cm across or smaller and has entered nearby lymph nodes. Or, the tumor is 5 cm across or smaller and has invaded surrounding tissue but is not found in nearby lymph nodes. There may be more than one tumor present.  Stage III. At this stage, the tumor may be any size. There may  be more than one tumor in the lungs. The cancer cells have spread to the lymph nodes and possibly to other organs.  Stage IV. At this stage, there are tumors in both lungs and the cancer has spread to other areas of the body. The stages of small cell lung cancer are:  Limited. At this stage, the cancer is found only on one side of the chest.  Extensive. At this stage, the cancer is in the lungs and in tissues on the other side of the chest. The cancer has spread to other organs or is found in the fluid between the layers of your lungs. How is this treated? Depending on the type and stage of your lung cancer, you may be treated with:  Surgery. This is done to remove a tumor.  Radiation therapy. This treatment destroys  cancer cells using X-rays or other types of radiation.  Chemotherapy. This treatment uses medicines to destroy cancer cells.  Targeted therapy. This treatment aims to destroy only cancer cells instead of all cells as other therapies do. You may also have a combination of treatments. Follow these instructions at home:  Do not use any tobacco products. This includes cigarettes, chewing tobacco, and electronic cigarettes. If you need help quitting, ask your health care provider.  Take medicines only as directed by your health care provider.  Eat a healthy diet. Work with a dietitian to make sure you are getting the nutrition you need.  Consider joining a support group or seeking counseling to help you cope with the stress of having lung cancer.  Let your cancer specialist (oncologist) know if you are admitted to the hospital.  Keep all follow-up visits as directed by your health care provider. This is important. Contact a health care provider if:  You lose weight without trying.  You have a persistent cough and wheezing.  You feel short of breath.  You tire easily.  You experience bone or joint pain.  You have difficulty swallowing.  You feel hoarse or notice your voice changing.  Your pain medicine is not helping. Get help right away if:  You cough up blood.  You have new breathing problems.  You develop chest pain.  You develop swelling in:  One or both ankles or legs.  Your face, neck, or arms.  You are confused.  You experience paralysis in your face or a drooping eyelid. This information is not intended to replace advice given to you by your health care provider. Make sure you discuss any questions you have with your health care provider. Document Released: 06/06/2000 Document Revised: 08/06/2015 Document Reviewed: 07/04/2013 Elsevier Interactive Patient Education  2017 [place].   Acute Respiratory Failure, Adult Acute respiratory failure occurs when  there is not enough oxygen passing from your lungs to your body. When this happens, your lungs have trouble removing carbon dioxide from the blood. This causes your blood oxygen level to drop too low as carbon dioxide builds up. Acute respiratory failure is a medical emergency. It can develop quickly, but it is temporary if treated promptly. Your lung capacity, or how much air your lungs can hold, may improve with time, exercise, and treatment. What are the causes? There are many possible causes of acute respiratory failure, including:  Lung injury.  Chest injury or damage to the ribs or tissues near the lungs.  Lung conditions that affect the flow of air and blood into and out of the lungs, such as pneumonia, acute respiratory distress syndrome, and  cystic fibrosis.  Medical conditions, such as strokes or spinal cord injuries, that affect the muscles and nerves that control breathing.  Blood infection (sepsis).  Inflammation of the pancreas (pancreatitis).  A blood clot in the lungs (pulmonary embolism).  A large-volume blood transfusion.  Burns.  Near-drowning.  Seizure.  Smoke inhalation.  Reaction to medicines.  Alcohol or drug overdose. What increases the risk? This condition is more likely to develop in people who have:  A blocked airway.  Asthma.  A condition or disease that damages or weakens the muscles, nerves, bones, or tissues that are involved in breathing.  A serious infection.  A health problem that blocks the unconscious reflex that is involved in breathing, such as hypothyroidism or sleep apnea.  A lung injury or trauma. What are the signs or symptoms? Trouble breathing is the main symptom of acute respiratory failure. Symptoms may also include:  Rapid breathing.  Restlessness or anxiety.  Skin, lips, or fingernails that appear blue (cyanosis).  Rapid heart rate.  Abnormal heart rhythms (arrhythmias).  Confusion or changes in  behavior.  Tiredness or loss of energy.  Feeling sleepy or having a loss of consciousness. How is this diagnosed? Your health care provider can diagnose acute respiratory failure with a medical history and physical exam. During the exam, your health care provider will listen to your heart and check for crackling or wheezing sounds in your lungs. Your may also have tests to confirm the diagnosis and determine what is causing respiratory failure. These tests may include:  Measuring the amount of oxygen in your blood (pulse oximetry). The measurement comes from a small device that is placed on your finger, earlobe, or toe.  Other blood tests to measure blood gases and to look for signs of infection.  Sampling your cerebral spinal fluid or tracheal fluid to check for infections.  Chest X-ray to look for fluid in spaces that should be filled with air.  Electrocardiogram (ECG) to look at the heart's electrical activity. How is this treated? Treatment for this condition usually takes places in a hospital intensive care unit (ICU). Treatment depends on what is causing the condition. It may include one or more treatments until your symptoms improve. Treatment may include:  Supplemental oxygen. Extra oxygen is given through a tube in the nose, a face mask, or a hood.  A device such as a continuous positive airway pressure (CPAP) or bi-level positive airway pressure (BiPAP or BPAP) machine. This treatment uses mild air pressure to keep the airways open. A mask or other device will be placed over your nose or mouth. A tube that is connected to a motor will deliver oxygen through the mask.  Ventilator. This treatment helps move air into and out of the lungs. This may be done with a bag and mask or a machine. For this treatment, a tube is placed in your windpipe (trachea) so air and oxygen can flow to the lungs.  Extracorporeal membrane oxygenation (ECMO). This treatment temporarily takes over the  function of the heart and lungs, supplying oxygen and removing carbon dioxide. ECMO gives the lungs a chance to recover. It may be used if a ventilator is not effective.  Tracheostomy. This is a procedure that creates a hole in the neck to insert a breathing tube.  Receiving fluids and medicines.  Rocking the bed to help breathing. Follow these instructions at home:  Take over-the-counter and prescription medicines only as told by your health care provider.  Return to normal  activities as told by your health care provider. Ask your health care provider what activities are safe for you.  Keep all follow-up visits as told by your health care provider. This is important. How is this prevented? Treating infections and medical conditions that may lead to acute respiratory failure can help prevent the condition from developing. Contact a health care provider if:  You have a fever.  Your symptoms do not improve or they get worse. Get help right away if:  You are having trouble breathing.  You lose consciousness.  Your have cyanosis or turn blue.  You develop a rapid heart rate.  You are confused. These symptoms may represent a serious problem that is an emergency. Do not wait to see if the symptoms will go away. Get medical help right away. Call your local emergency services (911 in the [place] Do not drive yourself to the hospital.  This information is not intended to replace advice given to you by your health care provider. Make sure you discuss any questions you have with your health care provider. Document Released: 03/05/2013 Document Revised: 09/26/2015 Document Reviewed: 09/16/2015 Elsevier Interactive Patient Education  2017 [employer].

## 2016-04-28 NOTE — Progress Notes (Signed)
Basic chemotherapy education begun with patient and spouse.  Both able to participate in "Teach Back" , understand the importance of preventing infection, handwashing. See education note in [facility].

## 2016-04-28 NOTE — Consult Note (Signed)
Consultation Note Date: 04/28/2016   Patient Name: [name] Chapman  DOB: [birth year]  MRN: [id number]  Age / Sex: 56 y.o., male  PCP: [name], MD Referring Physician: Murlean Iba, MD  Reason for Consultation: Disposition and Establishing goals of care with regard to stage 4 lung cancer.  HPI/Patient Profile: 56 y.o. male with past medical history of metastatic lung adenocarcinoma stage 4, left common iliac artery occlusion, and T4 and L3 fractures who was admitted on 04/26/2016 with dyspnea.  [name] Chapman complained of 4-5 days of SOB at [place] prior to ER arrival on day of admission.  He had completed a course of palliative radiation last week.  In the ER, he was found to have a pulse ox of < 88% on ambulation and was admitted.  Dr. Irene Limbo met with the patient yesterday to weigh treatment options going forward; plans were made to start chemotherapy on 05/05/16.  [name] Chapman O2 sats and symptoms have improved with supplemental O2, and he will likely remain on nasal canula at home.   Clinical Assessment and Goals of Care: We arrive to meet [name] Chapman laying in bed, finishing a conversation with Dr. Wynetta Emery.  I introduce Palliative Care, then ask him how he's feeling.  He denies any pain, SOB, insomnia.  He tells me he feels much better than when he arrived at the hospital.  We discuss his meeting with Dr. Irene Limbo yesterday, and he explains their plan to move forward with chemotherapy.   We talk about the capabilities of medicine, and also the shortcomings - that while treatments are often numerous and available, they are not always in the patient's best interest.  I simply remind him that he has a choice, and that it's helpful to know one's limits before they're reached.  He agrees that quality of life is his goal, but needs to try chemotherapy in order to decide if the possible toll is worth the possible gain.    He  clearly has faith in Dr. Grier Mitts guidance, but also in that of God.  He believes that, in terms of treatment, a time will come when "enough is enough."  He cannot tell me where exactly he would draw that line, but emphasizes that he does not want to become a burden to his family, specifically his wife, with whom he lives.  That leads [place] to discuss his world outside of medicine.  [name] Chapman is a father a three boys and three grandchildren.  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  In that vein, he explains his struggle with applying for disability benefits, that he worries it could take months to receive them and that he needs the income to buy life insurance.  Again, he indicates fear of becoming a burden, although this time post-mortem.  He does not want his family to worry about his hospital bills, funeral, or burial.  When I note that he has put a  lot of thought into the future, he explains that a doctor told him his prognosis is unknown, and he could live anywhere from a few months to years.  I offer Palliative Care as an extra set of hands to follow him through this process, and he confirms he'd like the support.   Primary Decision Maker:  PATIENT and if needed, wife    SUMMARY OF RECOMMENDATIONS    Please discharge with palliative care outpatient follow up.  Code Status/Advance Care Planning:  DNR  Symptom Management:   Continue per primary care team  Additional Recommendations (Limitations, Scope, Preferences):  Full Scope Treatment  Psycho-social/Spiritual:   Desire for further Chaplaincy support: none  Prognosis:   Unable to determine, per oncology assessment   Discharge Planning: Home with Palliative Services      Primary Diagnoses: Present on Admission: . Malignant neoplasm of upper lobe of right lung ([place]) . Protein-calorie malnutrition, severe . Acute respiratory failure ([place]) . Acute respiratory  failure with hypoxia ([place])   I have reviewed the medical record, interviewed the patient and family, and examined the patient. The following aspects are pertinent.  Past Medical History:  Diagnosis Date  . 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 Marital status: Single    Spouse name: N/A  . Number of children: N/A  . Years of education: N/A   Social History Main Topics  . Smoking status: Former Smoker    Packs/day: 1.00    Years: 30.00    Types: Cigarettes    Quit date: 01/21/2016  . Smokeless tobacco: Never Used  . Alcohol use No     Comment: reports he used to drink approximately six beers per week but, stopped since diagnosis.  . Drug use: No  . Sexual activity: Not Currently   Other Topics Concern  . None   Social History Narrative  . None   Family History  Problem Relation Age of Onset  . CAD Father   . Cancer Neg Hx    Scheduled Meds: . dexamethasone  5 mg Intravenous Q12H  . enoxaparin (LOVENOX) injection  40 mg Subcutaneous Q24H  . famotidine  10 mg Oral BID  . feeding supplement (ENSURE ENLIVE)  237 mL Oral TID BM  . multivitamin with minerals  1 tablet Oral Daily  . nicotine  7 mg Transdermal Daily  . sodium chloride flush  3 mL Intravenous Q12H   Continuous Infusions: PRN Meds:.sodium chloride, acetaminophen, HYDROcodone-acetaminophen, ondansetron **OR** ondansetron (ZOFRAN) IV, sodium chloride flush No Known Allergies Review of Systems  Constitutional: Negative for appetite change.  Respiratory: Negative for cough, chest tightness and shortness of breath.   Gastrointestinal: Negative for nausea.  Psychiatric/Behavioral: Negative for sleep disturbance.   Physical Exam  Slender, weak appearing gentleman Appears older than stated age Normocephalic, atraumatic No noted work of breathing Clubbing noted on upper extremities No edema or cyanosis Alert and oriented, fluid  speech Appropriate affect and behavior  Vital Signs: BP 111/81 (BP Location: Right Arm)   Pulse 97   Temp 98.6 F (37 C) (Oral)   Resp (!) 22   Ht '5\' 6"'  (1.676 m)   Wt 62.7 kg (138 lb 4.8 oz)   SpO2 (!) 89%   BMI 22.32 kg/m  Pain Assessment: 0-10 POSS *See Group Information*: S-Acceptable,Sleep, easy to arouse Pain Score: Asleep   SpO2: SpO2: (!) 89 % O2 Device:SpO2: (!) 89 %  O2 Flow Rate: .O2 Flow Rate (L/min): 4 L/min  IO: Intake/output summary:  Intake/Output Summary (Last 24 hours) at 04/28/16 0908 Last data filed at 04/28/16 0600  Gross per 24 hour  Intake              120 ml  Output                0 ml  Net              120 ml    LBM: Last BM Date: 04/26/16 Baseline Weight: Weight: 64.9 kg (143 lb) Most recent weight: Weight: 62.7 kg (138 lb 4.8 oz)     Palliative Assessment/Data:   Flowsheet Rows   Flowsheet Row Most Recent Value  Intake Tab  Referral Department  Critical care  Unit at Time of Referral  Cardiac/Telemetry Unit  Palliative Care Primary Diagnosis  Cancer  Date Notified  04/26/16  Palliative Care Type  New Palliative care  Reason for referral  Clarify Goals of Care  Date of Admission  04/26/16  Date first seen by Palliative Care  04/28/16  # of days Palliative referral response time  2 Day(s)  # of days IP prior to Palliative referral  0  Clinical Assessment  Palliative Performance Scale Score  50%  Psychosocial & Spiritual Assessment  Palliative Care Outcomes  Patient/Family meeting held?  Yes  Who was at the meeting?  Patient  Palliative Care Outcomes  Clarified goals of care, Linked to palliative care logitudinal support  Palliative Care follow-up planned  Yes, Home      Time In: 8:00 Time Out: 9:10 Time Total: 70 minutes Greater than 50%  of this time was spent counseling and coordinating care related to the above assessment and plan.  Signed by: Olene Floss, PA-S Imogene Burn, PA-C Palliative Medicine Pager:  [phone]   Please contact Palliative Medicine Team phone at [phone] for questions and concerns.  For individual provider: See Shea Evans

## 2016-04-28 NOTE — Care Management Note (Signed)
Case Management Note  Patient Details  Name: [name] Chapman MRN: [id number] Date of Birth: 1960/03/31  Subjective/Objective:                  right sided lung adenoCA stage 4 Action/Plan: Discharge planning Expected Discharge Date:  04/28/16               Expected Discharge Plan:  [name]  In-House Referral:     Discharge planning Services  CM Consult  Post Acute Care Choice:  Home Health Choice offered to:  Patient  DME Arranged:  Oxygen DME Agency:  [place] Arranged:  PT, RN [facility] Agency:  [place]  Status of Service:  Completed, signed off  If discussed at [place] of Stay Meetings, dates discussed:    Additional Comments: CM met with pt in room to offer choice of home health agency. Pt and spouse of pt choose [facility] for HHPT/RN and home oxygen.  Referral for home health services and home oxygen were called to [facility] rep, Joelene Millin.  Pt declines all other DME.  No other CM needs were communicated. Dellie Catholic, RN 04/28/2016, 12:34 PM

## 2016-04-28 NOTE — Progress Notes (Signed)
Patient given discharge instructions. No questions or concerns at this time.

## 2016-04-28 NOTE — Discharge Summary (Signed)
Physician Discharge Summary  [name] Chapman JGO:[id number] DOB: 1960/12/04 DOA: 04/26/2016  PCP: Dimas Chyle, MD  Admit date: 04/26/2016 Discharge date: 04/28/2016  Admitted From: Home  Disposition:  Home with [facility] services  Recommendations for Outpatient Follow-up:  1. Follow up with PCP in 5 days, follow up with oncologist next week  2. Please obtain BMP/CBC in one week  Home Health: RN, SW Equipment/Devices: Oxygen  Discharge Condition: STABLE CODE STATUS: DNR  Brief/Interim Summary: HPI: [name] Chapman is a 56 y.o. 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 He is found to have a pulse ox of < 88% on ambulation. He states he has been short of breath for about 4-5 days now which is worse with exertion. He does not have a fever, cough or chills. No one sick at home.     ED Course: HR > 100, RR in high 20s and 30s, pulse ox 87 % on 4 l, sodium 129- CT chest shows progression of cancer with new masses- no other abnormality such as PE or pneumonia  Hospital course: [name] Chapman a 56 y.o.malewith medical history significant of right sided lung adenoCA stage 4 on palliative radiation which he finished last Thursday with plans to start chemo on March 1st, Left common iliac artery occlusion, T4 and L3 fractures without back pain who presents from radiation oncology for dyspnea. He is found to have a pulse ox of < 88% on ambulation.He states he has been short of breath for about 4-5 days now which is worse with exertion. He does not have a fever, cough or chills. No one sick at home.   Assessment & Plan:   Acute respiratory failure - metastatic adenocarcinoma of the lungs - due to progression of lung CA- will increase his steroids in  attempt to alleviate some of his resp distress tonight- Nebs would not help this- does not appear to have the flu  - cont O2 as needed- will need oxygen at home 4L, [facility] RN and SW ordered.   - Chemo planned to start Feb 22 (see oncology notes below) - I called and requested consult from Dr. Irene Limbo to help with disposition and treatment options (see oncology notes below) - Pt wants to pursue further treatment, arranged for [facility] SW to assist if home palliative services are eventually needed for patient - currently does not complain of pain- has Norco prescribed for home but has not needed it - DNR discussed with patient - Will discharge home on steroid taper as recommended by Dr. 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 Irene Limbo) HEMATOLOGY/ONCOLOGY INPATIENT PROGRESS NOTE  Date of Service: 04/27/2016  Inpatient Attending: .Murlean Iba, MD   SUBJECTIVE  Mr Salts was  admitted from the radiation oncology clinic with increasing shortness of breath and hypoxia. His right lung main mass is slightly smaller but a CTA of the chest shows progressive disease with multiple new lung lesions. He notes that his breathing is better with the oxygen. He has been eating well and his albumin levels and overall nutritional status and anemia has improved. We discussed in details his goals of  care and the option to pursue best supportive cares through hospice versus consideration of palliative chemotherapy as previously planned starting on 05/05/2016.  Patient notes that he would want to consider palliative chemotherapy. We have ordered vitamin B12 subcutaneous dose as chemotherapy premedication. My nurses going to try to see if he can get his chemotherapy counseling scheduled for Friday as inpatient to save him a visit to the clinic. ASSESSMENT & PLAN:   56 yo with   1) increase in shortness of breath and hypoxemic respiratory failure. This appears to be primarily related to some disease progression. No overt evidence of a pneumonia. No evidence of pulmonary embolism. Has limited pulmonary reserve at baseline due to emphysema/COPD from his extensive smoking history. Could have an element of radiation pneumonitis. 2)Stage IV ([id number]) Rt Lung large lung biopsy proven lung adenocarcinoma with concern for pleural thickening and pleural involvement, MRI brain neg for brain mets. EGFR, ALK, ROS-1, BRAF neg PDL1 status- 10% PET/CT results noted CT of the chest shows disease progression. Completed palliative radiation therapy in 04/21/2016. 3) Emphysema 4) Cigarette smoking (has quit for the last several weeks - currently using nicotine patch) Plan -Reasonable to start the patient on prednisone 60 mg by mouth daily for short one-week first to treat any elements of COPD exacerbation or radiation pneumonitis with a taper over one week after that. -After extensive discussion of goals of care the patient would like to proceed with palliative chemotherapy as planned from 03/04/2017. He has a clinic visit with me on the same day with labs. -given his PDL1 <50% and in the absence targetable mutations for his lung cancer we discussed and he is agreeable to pursue chemotherapy withplatinum doublet(carboplatin + Alimta with G-CSF support) about 2 weeks after completion of palliative RT .Marland KitchenMarland Kitchen Plan  to start on 05/05/2016 has been set up already. -Would likely need home oxygen on discharge -Would be reasonable to consider home palliative care referral.  5) Microcytic Anemia Likely predominantly anemia of chronic disease from lung cancer. Significantly elevated inflammatory markers. Also has elevated rheumatoid factor levels ?RA. No overt focal joint symptoms at this time. Improved after transfusion.   6) Left common iliac artery occlusion. 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 -likely related to PAD due to smoking. -outpatient f/u with vascular surgery - on ASA per their recommendation. No intervention planned. -Continued smoking cessation  6) Compression acute vs subacute fracture of rt L3 and L4 transverse processes  -pain mx. Pain is currently controlled   7) Smoking -has quit  about a month ago -continuenicotine patch  8) Anorexia likely due to malignancy. Significant improvement in appetite with Dexamethasone with good po intake. weigt stable. Improved serum albumin levels  I spent 30 minutes counseling the patient face to face. The total time spent in the appointment was 45 minutes and more than 50% was on counseling and direct patient cares.    Sullivan Lone MD [place] AAHIVMS [facility] Hematology/Oncology Physician [facility]  (Office):       [phone] (Work cell):  [phone] (Fax):  [phone]  Discharge Diagnoses:  Principal Problem:   Acute respiratory failure with hypoxia [facility]) Active Problems:   Protein-calorie malnutrition, severe   Malignant neoplasm of upper lobe of right lung [facility])   Acute respiratory failure [facility])  Discharge Instructions  Discharge Instructions    Increase activity slowly    Complete by:  As directed      Allergies as of 04/28/2016   No Known Allergies     Medication List    STOP taking these medications   aspirin EC 81 MG tablet   dexamethasone 4 MG tablet Commonly known as:  DECADRON    folic acid 1 MG tablet Commonly known as:  FOLVITE     TAKE these medications   acetaminophen 325 MG tablet Commonly known as:  TYLENOL Take 2 tablets (650 mg total) by mouth every 6 (six) hours as needed.   famotidine 20 MG tablet Commonly known as:  PEPCID Take 1 tablet (20 mg total) by mouth daily.   feeding supplement (ENSURE ENLIVE) Liqd Take 237 mLs by mouth 3 (three) times daily between meals. What changed:  when to take this   HYDROcodone-acetaminophen 5-325 MG tablet Commonly known as:  NORCO Take 1 tablet by mouth every 6 (six) hours as needed for moderate pain.   multivitamin with minerals Tabs tablet Take 1 tablet by mouth daily.   nicotine 14 mg/24hr patch Commonly known as:  NICODERM CQ - dosed in mg/24 hours Place 1 patch (14 mg total) onto the skin at bedtime.   ondansetron 8 MG tablet Commonly known as:  ZOFRAN Take 1 tablet (8 mg total) by mouth 2 (two) times daily as needed for refractory nausea / vomiting. Start on day 3 after chemo.   predniSONE 20 MG tablet Commonly known as:  DELTASONE Take 3 tabs po daily for 5 days, then take 2 tabs po daily for 5 days, then 1 tab po daily x 5d, then stop   RADIAPLEX EX Apply 1 application topically 2 (two) times daily.   sucralfate 1 g tablet Commonly known as:  CARAFATE Take 1 tablet (1 g total) by mouth 4 (four) times daily -  with meals and at bedtime. 5 min before meals for radiation induced esophagitis            Durable Medical Equipment        Start     Ordered   04/28/16 [phone]  For home use only DME oxygen  Once    Question Answer Comment  Mode or (Route) Nasal cannula   Liters per Minute 4   Frequency Continuous (stationary and portable oxygen unit needed)   Oxygen conserving device Yes   Oxygen delivery system Gas      04/28/16 0835     Follow-up Information    Dimas Chyle, MD. Schedule an appointment as soon as possible for a visit in 5 day(s).   Specialty:  Family  Medicine Contact information: [address] [address] [place], [place]. Go on 05/05/2016.   Specialties:  Hematology, Oncology Contact information: [place] [address] [phone]          No Known Allergies  Procedures/Studies: Dg Chest 2 View  Result Date: 04/26/2016 CLINICAL DATA:  Increasing shortness of breath and cough over the last few days EXAM: CHEST  2 VIEW COMPARISON:  03/02/2016 FINDINGS: Persistent right lower lobe mass lesion is seen. Nodules are noted bilaterally consistent with  metastatic disease. The left upper lobe nodule measures 2.5 cm increased in size from 9 mm on the prior exam. A 2.9 cm left basilar nodule is seen new from the prior study. No acute infiltrate or sizable effusion is noted. Mild interstitial edema is seen. IMPRESSION: Changes consistent with the known history of metastatic lung carcinoma. The nodules in the left lung have increased in size. New interstitial changes likely representing edema although the possibility of lymphangitic spread of carcinoma could not be totally excluded. Electronically Signed   By: Inez Catalina M.D.   On: 04/26/2016 12:03   Ct Angio Chest Pe W And/or Wo Contrast  Result Date: 04/26/2016 CLINICAL DATA:  56 year old male with stage IV lung cancer post radiation therapy completed 4 days ago. Chemotherapy to start this week. Shortness breath. Right back pain. Subsequent encounter. EXAM: CT ANGIOGRAPHY CHEST WITH CONTRAST TECHNIQUE: Multidetector CT imaging of the chest was performed using the standard protocol during bolus administration of intravenous contrast. Multiplanar CT image reconstructions and MIPs were obtained to evaluate the vascular anatomy. CONTRAST:  100 cc Isovue 370. COMPARISON:  04/26/2016 chest x-ray. 02/05/2016 chest CT. 03/02/2016 PET-CT. FINDINGS: Cardiovascular:  No pulmonary embolus or aortic dissection. Mediastinum/Nodes: Prominent  mediastinal and hilar adenopathy minimally more prominent than on prior exam. Slightly enlarged right axillary lymph nodes new from prior exam. Esophagus is compressed by subcarinal adenopathy. Lungs/Pleura: Large necrotic mass filling the majority of the right lower lobe spanning over 12.6 x 9 x 12.5 cm minimally changed from prior exam when this measured 13 x 10 x 12.8 cm. This mass surrounds and narrows pulmonary arteries veins and bronchi. Several new or enlarging metastatic lesions throughout all lungs (approximately 15 in number). Index lymph node left upper lobe now with maximal dimension of 21 mm versus prior 5 mm. New mass left lower lobe measures up to 2.4 cm. Interstitial spread of tumor suspected throughout the lungs most notable right upper lobe and left lower lobe. Upper Abdomen: No adrenal lesion or liver lesion noted. Musculoskeletal: Tumor abuts the pleura adjacent to ribs but without rib destruction. Review of the MIP images confirms the above findings. IMPRESSION: No pulmonary embolus detected. Primary large necrotic tumor right lower lobe minimally changed. Several new or enlarging metastatic lesions throughout all lungs (approximately 15 in number). Interstitial spread of tumor suspected throughout the lungs most notable right upper lobe and left lower lobe. Prominent mediastinal and hilar adenopathy minimally more prominent than on prior exam. Slightly enlarged right axillary lymph nodes new from prior exam. Electronically Signed   By: Genia Del M.D.   On: 04/26/2016 14:38    (Echo, Carotid, EGD, Colonoscopy, ERCP)   Subjective: Pt says he feels better with supplemental oxygen  Discharge Exam: Vitals:   04/28/16 0431 04/28/16 0800  BP: 111/82 111/81  Pulse: 97 97  Resp: 20 (!) 22  Temp: 97.5 F (36.4 C) 98.6 F (37 C)   Vitals:   04/27/16 1414 04/27/16 2025 04/28/16 0431 04/28/16 0800  BP: 113/81 109/82 111/82 111/81  Pulse: 91 95 97 97  Resp: _0 (!) 22  Temp:  97.7 F (36.5 C) 97.7 F (36.5 C) 97.5 F (36.4 C) 98.6 F (37 C)  TempSrc: Oral Oral Oral Oral  SpO2: 90% 93% 95% (!) 89%  Weight:      Height:        General exam: thin emaciated male, NAD, cooperative.  Respiratory system:  No increased work of breathing. Cardiovascular system: S1 & S2 heard,  tachycardic.  Gastrointestinal system: Abdomen is nondistended, soft and nontender. Normal bowel sounds heard. Central nervous system: Alert and oriented. No focal neurological deficits. Extremities: no cyanosis.  The results of significant diagnostics from this hospitalization (including imaging, microbiology, ancillary and laboratory) are listed below for reference.     Microbiology: No results found for this or any previous visit (from the past 240 hour(s)).   Labs: BNP (last 3 results)  Recent Labs  04/26/16 1200  BNP 54.5   Basic Metabolic Panel:  Recent Labs Lab 04/26/16 1200 04/27/16 0613  NA 129* 127*  K 4.3 4.4  CL 98* 96*  CO2 17* 22  GLUCOSE 113* 129*  BUN 22* 14  CREATININE 0.90 0.69  CALCIUM 9.1 8.7*   Liver Function Tests:  Recent Labs Lab 04/26/16 1200  AST 40  ALT 35  ALKPHOS 142*  BILITOT 0.9  PROT 8.3*  ALBUMIN 3.2*   No results for input(s): LIPASE, AMYLASE in the last 168 hours. No results for input(s): AMMONIA in the last 168 hours. CBC:  Recent Labs Lab 04/26/16 0904 04/26/16 1200  WBC 5.9 5.6  NEUTROABS 5.3 5.0  HGB 12.3* 12.8*  HCT 37.0* 38.3*  MCV 79.1* 78.5  PLT 310 277   Cardiac Enzymes:  Recent Labs Lab 04/26/16 1200  TROPONINI <0.03   BNP: Invalid input(s): POCBNP CBG: No results for input(s): GLUCAP in the last 168 hours. D-Dimer No results for input(s): DDIMER in the last 72 hours. Hgb A1c No results for input(s): HGBA1C in the last 72 hours. Lipid Profile No results for input(s): CHOL, HDL, LDLCALC, TRIG, CHOLHDL, LDLDIRECT in the last 72 hours. Thyroid function studies No results for input(s): TSH,  T4TOTAL, T3FREE, THYROIDAB in the last 72 hours.  Invalid input(s): FREET3 Anemia work up No results for input(s): VITAMINB12, FOLATE, FERRITIN, TIBC, IRON, RETICCTPCT in the last 72 hours. Urinalysis    Component Value Date/Time   COLORURINE YELLOW 01/27/2016 [place] 01/27/2016 1246   LABSPEC 1.022 01/27/2016 1246   PHURINE 6.0 01/27/2016 1246   GLUCOSEU NEGATIVE 01/27/2016 [place] 01/27/2016 [place] 01/27/2016 1246   KETONESUR NEGATIVE 01/27/2016 [place] 01/27/2016 1246   NITRITE NEGATIVE 01/27/2016 1246   LEUKOCYTESUR NEGATIVE 01/27/2016 1246   Sepsis Labs Invalid input(s): PROCALCITONIN,  WBC,  LACTICIDVEN Microbiology No results found for this or any previous visit (from the past 240 hour(s)).  Time coordinating discharge: 33 minutes  SIGNED:  Irwin Brakeman, MD  [facility] 04/28/2016, 9:14 AM Pager   If 7PM-7AM, please contact night-coverage [email or web address] Password [email or web address]

## 2016-04-28 NOTE — Progress Notes (Signed)
PT Cancellation Note  Patient Details Name: [name] MRN: [id number] DOB: [birth year]   Cancelled Treatment:    Reason Eval/Treat Not Completed: PT screened, no needs identified, will sign off (has walked with nursing, Plans for  DC home today. )  Tresa Endo PT [phone]  Claretha Cooper 04/28/2016, 12:05 PM

## 2016-04-28 NOTE — Progress Notes (Addendum)
SATURATION QUALIFICATIONS: (This note is used to comply with regulatory documentation for home oxygen)  Patient Saturations on Room Air at Rest = 92%  Patient Saturations on Room Air while Ambulating = 71%  Patient Saturations on 4 Liters of oxygen while Ambulating = 86%  Please briefly explain why patient needs home oxygen: Patient became short of breath while ambulating on room air.

## 2016-04-29 ENCOUNTER — Other Ambulatory Visit: Payer: Self-pay

## 2016-04-29 ENCOUNTER — Ambulatory Visit: Payer: Self-pay

## 2016-05-03 ENCOUNTER — Encounter: Payer: Self-pay | Admitting: Surgery

## 2016-05-03 ENCOUNTER — Telehealth: Payer: Self-pay | Admitting: Licensed Clinical Social Worker

## 2016-05-03 NOTE — Progress Notes (Addendum)
Call from Patient's live in girlfriend Ms. Deneise Lever [phone].  States patient has an appointment at the [facility] Thursday 05/05/16 at 9:00.  Friend reports patient is not doing well with his cancer diagnosis. She is concerned that if patient has to go back to the hospital he will take his life as he does not want to go back.  Friend is with patient daily and does not leave him alone.  LCSW assessed for means, history and plan for SI. None at this time. States patient is fine at this time however, this is what he shared with her. She will be concerned based on what the doctors tell him. LCSW discussed options for crisis plan and caring for patient:call 911 or mobile crisis.(phone numbers provided). Patient going to appointment at the [facility] Thursday. [place] for coordination of care.  Left message to call LCSW.   Plan: LCSW will follow up with [facility] worker.   Casimer Lanius, LCSW Licensed Clinical Social Worker [place]   [phone] 4:40 PM

## 2016-05-05 ENCOUNTER — Encounter: Payer: Self-pay | Admitting: Hematology

## 2016-05-05 ENCOUNTER — Ambulatory Visit (HOSPITAL_BASED_OUTPATIENT_CLINIC_OR_DEPARTMENT_OTHER): Payer: Medicaid Other | Admitting: Hematology

## 2016-05-05 ENCOUNTER — Telehealth: Payer: Self-pay | Admitting: *Deleted

## 2016-05-05 ENCOUNTER — Telehealth: Payer: Self-pay | Admitting: Family Medicine

## 2016-05-05 ENCOUNTER — Encounter: Payer: Self-pay | Admitting: *Deleted

## 2016-05-05 ENCOUNTER — Ambulatory Visit: Payer: Medicaid Other

## 2016-05-05 ENCOUNTER — Other Ambulatory Visit (HOSPITAL_BASED_OUTPATIENT_CLINIC_OR_DEPARTMENT_OTHER): Payer: Medicaid Other

## 2016-05-05 VITALS — BP 114/69 | HR 144 | Temp 98.2°F | Resp 20 | Ht 66.0 in | Wt 136.2 lb

## 2016-05-05 DIAGNOSIS — G893 Neoplasm related pain (acute) (chronic): Secondary | ICD-10-CM

## 2016-05-05 DIAGNOSIS — C3491 Malignant neoplasm of unspecified part of right bronchus or lung: Secondary | ICD-10-CM | POA: Diagnosis present

## 2016-05-05 DIAGNOSIS — F419 Anxiety disorder, unspecified: Secondary | ICD-10-CM

## 2016-05-05 DIAGNOSIS — R630 Anorexia: Secondary | ICD-10-CM

## 2016-05-05 LAB — COMPREHENSIVE METABOLIC PANEL
ALBUMIN: 2.5 g/dL — AB (ref 3.5–5.0)
ALT: 40 U/L (ref 0–55)
AST: 29 U/L (ref 5–34)
Alkaline Phosphatase: 167 U/L — ABNORMAL HIGH (ref 40–150)
Anion Gap: 16 mEq/L — ABNORMAL HIGH (ref 3–11)
BILIRUBIN TOTAL: 0.49 mg/dL (ref 0.20–1.20)
BUN: 12.9 mg/dL (ref 7.0–26.0)
CALCIUM: 9.9 mg/dL (ref 8.4–10.4)
CO2: 21 mEq/L — ABNORMAL LOW (ref 22–29)
CREATININE: 0.6 mg/dL — AB (ref 0.7–1.3)
Chloride: 100 mEq/L (ref 98–109)
EGFR: >90 mL/min/{1.73_m2} (ref >90)
GLUCOSE: 80 mg/dL (ref 70–140)
Potassium: 3.8 mEq/L (ref 3.5–5.1)
SODIUM: 136 meq/L (ref 136–145)
TOTAL PROTEIN: 7.9 g/dL (ref 6.4–8.3)

## 2016-05-05 LAB — CBC & DIFF AND RETIC
BASO%: 0.2 % (ref 0.0–2.0)
BASOS ABS: 0 10*3/uL (ref 0.0–0.1)
EOS ABS: 0 10*3/uL (ref 0.0–0.5)
EOS%: 0.5 % (ref 0.0–7.0)
HCT: 39 % (ref 38.4–49.9)
HEMOGLOBIN: 12.8 g/dL — AB (ref 13.0–17.1)
IMMATURE RETIC FRACT: 5.5 % (ref 3.00–10.60)
LYMPH#: 0.4 10*3/uL — AB (ref 0.9–3.3)
LYMPH%: 7.1 % — ABNORMAL LOW (ref 14.0–49.0)
MCH: 26.2 pg — ABNORMAL LOW (ref 27.2–33.4)
MCHC: 32.8 g/dL (ref 32.0–36.0)
MCV: 79.9 fL (ref 79.3–98.0)
MONO#: 0.2 10*3/uL (ref 0.1–0.9)
MONO%: 3.7 % (ref 0.0–14.0)
NEUT#: 5 10*3/uL (ref 1.5–6.5)
NEUT%: 88.5 % — AB (ref 39.0–75.0)
NRBC: 0 % (ref 0–0)
Platelets: 199 10*3/uL (ref 140–400)
RBC: 4.88 10*6/uL (ref 4.20–5.82)
RDW: 23.8 % — AB (ref 11.0–14.6)
RETIC %: 1.13 % (ref 0.80–1.80)
Retic Ct Abs: 55.14 10*3/uL (ref 34.80–93.90)
WBC: 5.7 10*3/uL (ref 4.0–10.3)

## 2016-05-05 MED ORDER — LORAZEPAM 0.5 MG PO TABS: 0.5000 mg | ORAL_TABLET | Freq: Three times a day (TID) | ORAL | 0 refills | Status: AC

## 2016-05-05 MED ORDER — DEXAMETHASONE 2 MG PO TABS: 2.0000 mg | ORAL_TABLET | Freq: Every day | ORAL | 0 refills | Status: AC

## 2016-05-05 MED ORDER — SENNOSIDES-DOCUSATE SODIUM 8.6-50 MG PO TABS: 2.0000 | ORAL_TABLET | Freq: Every day | ORAL | 1 refills | Status: AC

## 2016-05-05 MED ORDER — OXYCODONE HCL 5 MG/5ML PO SOLN: ORAL | 0 refills | Status: AC

## 2016-05-05 MED ORDER — SENNOSIDES-DOCUSATE SODIUM 8.6-50 MG PO TABS: 2.0000 | ORAL_TABLET | Freq: Every day | ORAL | 1 refills | Status: DC

## 2016-05-05 NOTE — Telephone Encounter (Signed)
Hospice referral made to [facility]. MD Irene Limbo will not be the attending of record.

## 2016-05-05 NOTE — Progress Notes (Signed)
[place] Psychosocial Distress Screening Clinical Social Work  Clinical Social Work was referred by [facility] LCSW due to comments pt made about not wanting to return to hospital if he got sicker. CSW met with pt and his significant other of 30+ years, Deneise Lever to check in and review the distress screening protocol.  The patient scored a 6 on the Psychosocial Distress Thermometer which indicates moderate distress. Clinical Social Worker met with pt, Deneise Lever and later on Dr. Irene Limbo; to assess for distress and other psychosocial needs.   Pt and significant other described increased stress and anxiety due to various concerns. Pt and Deneise Lever have had issues with bed bugs in their apartment and their landlord has charged all residents in their building and additional fee to treat for this issue. They report their apartment has been treated. Pt has not been sensitive, but Deneise Lever has been impacted. CSW provided them community resource; [employer] for assistance with landlord relations/legailty on this concern. Deneise Lever and Pt describe strained family relations due to care concerns. Deneise Lever has advocated for pt when he has not been up to communicating with family members, but she reports this has been difficult. CSW educated pt and Deneise Lever on caregiving stress, how advanced cancer impacts the whole family and relationships. CSW discussed how boundaries are often needed in order to cope effectively and are ok. CSW validated emotions and discussed additional coping techniques. Pt shared he has had increased anxiety related to what his treatment plan may be and outcomes. Pt shared he does not want to return to the hospital because he would rather avoid being there. He reports he is tired of hurting and being out of breath all the time. They both shared it was quite difficult to attempt daily tasks at home and request more help at home as well.   Dr Irene Limbo joined our discussion and addressed physical concerns and goals of care  fully. Pt and significant other were educated about hospice services and benefits. CSW provided them with additional resource information as well. When CSW exited exam room, the plan was to move forward with referral to [facility] to address comfort concerns, caregiving stress and end of life planning. Pt and Deneise Lever agree to reach out to CSW as needed.   ONCBCN DISTRESS SCREENING 05/05/2016  Screening Type Initial Screening  Distress experienced in past week (1-10) 6  Practical problem type Housing  Family Problem type Partner;Other (comment)  Emotional problem type Nervousness/Anxiety;Adjusting to illness  Physical Problem type Mouth sores/swallowing;Loss of appetitie;Tingling hands/feet;Skin dry/itchy;Swollen arms/legs  Physician notified of physical symptoms Yes  Referral to clinical social work Yes  Referral to support programs Yes  Referral to palliative care Yes    Clinical Social Worker follow up needed: No.  If yes, follow up plan:  Loren Racer, Marlinda Mike, OSW-C Clinical Social Worker [place]  [facility] Phone: [phone] Fax: [phone]

## 2016-05-05 NOTE — Telephone Encounter (Signed)
[place]:  Would dr Jerline Pain would agree to be his attending physican? If so would he want hospice to activate the hospice care orders? Please advise

## 2016-05-05 NOTE — Telephone Encounter (Signed)
Will forward to MD. Cloria Ciresi,CMA  

## 2016-05-06 ENCOUNTER — Other Ambulatory Visit: Payer: Self-pay

## 2016-05-06 ENCOUNTER — Ambulatory Visit: Payer: Medicaid Other | Admitting: Student

## 2016-05-06 ENCOUNTER — Ambulatory Visit: Payer: Self-pay

## 2016-05-06 ENCOUNTER — Telehealth: Payer: Self-pay | Admitting: *Deleted

## 2016-05-06 ENCOUNTER — Ambulatory Visit: Payer: Self-pay | Admitting: Hematology

## 2016-05-06 NOTE — Telephone Encounter (Signed)
I have only seen the patient once for his initial visit here - I would probably not be the best choice to be his hospice attending. Based on the telephone note from Oncology yesterday, it looks like Dr Irene Limbo was intended to be the attending of record.   [name] Chapman. Jerline Pain, [place] Resident PGY-3 05/06/2016 9:00 AM

## 2016-05-06 NOTE — Progress Notes (Signed)
[name]    HEMATOLOGY/ONCOLOGY CLINIC NOTE  Date of Service: .05/05/2016  Patient Care Team: [name], MD as PCP - General (Family Medicine)  CHIEF COMPLAINTS/PURPOSE OF CONSULTATION:   followup for lung adenocarcinoma  HISTORY OF PRESENTING ILLNESS:  plz see previous note for details   INTERVAL HISTORY   [name] was recently admitted from the radiation oncology clinic with increasing shortness of breath and hypoxia. His right lung main mass is slightly smaller but a CTA of the chest shows progressive disease with multiple new lung lesions. He was discharged home on oxygen and prednisone (for possible radiation pneumonitis). He arrives for his posthospitalization clinic visit to consider palliative chemotherapy. He notes increasing shortness of breath which is fairly debilitating at this time and is needing 4 L of oxygen by nasal cannula. He has started losing significant weight as well and has significant anorexia. He and his wife have had significant emotional anxieties related to his diagnosis. Currently his performance status is deteriorated to ECOG PS 3. Social worker was consulted due to his emotional distress and to help define goals of care. I spent a significant amount of time with the patient was over options and given his worsening performance status and increasing symptom burdens we discussed that palliative chemotherapy might not be well-tolerated and would probably not help him achieve his goals. He notes he wants to stay out of the hospital and wants to spend quality time with his wife and family. We had an extensive discussion and the patient and his wife decided to pursue hospice cares at home. Referral was given to [facility] for home hospice cares for metastatic lung cancer.   MEDICAL HISTORY:  Past Medical History:  Diagnosis Date  . 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 VIDEO BRONCHOSCOPY Bilateral 01/29/2016   Procedure: VIDEO BRONCHOSCOPY WITH FLUORO;  Surgeon: Collene Gobble, MD;  Location: [facility];  Service: Cardiopulmonary;  Laterality: Bilateral;    SOCIAL HISTORY: Social History   Social History  . Marital status: Single    Spouse name: N/A  . Number of children: N/A  . Years of education: N/A   Occupational History  . Not on file.   Social History Main Topics  . Smoking status: Former Smoker    Packs/day: 1.00    Years: 30.00    Types: Cigarettes    Quit date: 01/21/2016  . Smokeless tobacco: Never Used  . Alcohol use No     Comment: reports he used to drink approximately six beers per week but, stopped since diagnosis.  . Drug use: No  . Sexual activity: Not Currently   Other Topics Concern  . Not on file   Social History Narrative  . No narrative on file    FAMILY HISTORY: Family History  Problem Relation Age of Onset  . CAD Father   . Cancer Neg Hx     ALLERGIES:  has No Known Allergies.  MEDICATIONS:  Current Outpatient Prescriptions  Medication Sig Dispense Refill  . acetaminophen (TYLENOL) 325 MG tablet Take 2 tablets (650 mg total) by mouth every 6 (six) hours as needed. 60 tablet 2  . dexamethasone (DECADRON) 2 MG tablet Take 1 tablet (2 mg total) by mouth daily. 30 tablet 0  . feeding supplement, ENSURE ENLIVE, (ENSURE ENLIVE) LIQD Take 237 mLs by mouth 3 (  three) times daily between meals. 60 Bottle 0  . LORazepam (ATIVAN) 0.5 MG tablet Take 1 tablet (0.5 mg total) by mouth every 8 (eight) hours. 30 tablet 0  . oxyCODONE (ROXICODONE) 5 MG/5ML solution 70m po twice daily and may take additional 5-18mq6h as needed for pain or shortness of breath 473 mL 0  . predniSONE (DELTASONE) 20 MG tablet Take 3 tabs po daily for 5 days, then take 2 tabs po daily for 5 days, then 1 tab po daily x 5d, then stop 30 tablet 0  . senna-docusate (SENNA S) 8.6-50  MG tablet Take 2 tablets by mouth at bedtime. 60 tablet 1  . Wound Cleansers (RADIAPLEX EX) Apply 1 application topically 2 (two) times daily.      No current facility-administered medications for this visit.     REVIEW OF SYSTEMS:    10 Point review of Systems was done is negative except as noted above.  PHYSICAL EXAMINATION: ECOG PERFORMANCE STATUS: 2 - Symptomatic, <50% confined to bed  . Vitals:   05/05/16 1010  BP: 114/69  Pulse: (!) 144  Resp: 20  Temp: 98.2 F (36.8 C)   Filed Weights   05/05/16 1010  Weight: 136 lb 3 oz (61.8 kg)   .Body mass index is 21.98 kg/m.  GENERAL:alert, in no acute distress and comfortable SKIN: skin color, texture, turgor are normal, no rashes or significant lesions EYES: normal, conjunctiva are pink and non-injected, sclera clear OROPHARYNX:no exudate, no erythema and lips, buccal mucosa, and tongue normal  NECK: supple, no JVD, thyroid normal size, non-tender, without nodularity LYMPH:  no palpable lymphadenopathy in the cervical, axillary or inguinal LUNGS: Decreased air entry at right lung with a few scattered rhonchi HEART: regular rate & rhythm,  no murmurs and no lower extremity edema ABDOMEN: abdomen soft, non-tender, normoactive bowel sounds  Musculoskeletal: Left foot somewhat colder than the right foot PSYCH: alert & oriented x 3 with fluent speech NEURO: no focal motor/sensory deficits  LABORATORY DATA:  I have reviewed the data as listed  . CBC Latest Ref Rng & Units 05/05/2016 04/26/2016 04/26/2016  WBC 4.0 - 10.3 10e3/uL 5.7 5.6 5.9  Hemoglobin 13.0 - 17.1 g/dL 12.8(L) 12.8(L) 12.3(L)  Hematocrit 38.4 - 49.9 % 39.0 38.3(L) 37.0(L)  Platelets 140 - 400 10e3/uL 199 277 310    . CMP Latest Ref Rng & Units 05/05/2016 04/27/2016 04/26/2016  Glucose 70 - 140 mg/dl 80 129(H) 113(H)  BUN 7.0 - 26.0 mg/dL 12.9 14 22(H)  Creatinine 0.7 - 1.3 mg/dL 0.6(L) 0.69 0.90  Sodium 136 - 145 mEq/L 136 127(L) 129(L)  Potassium 3.5 -  5.1 mEq/L 3.8 4.4 4.3  Chloride 101 - 111 mmol/L - 96(L) 98(L)  CO2 22 - 29 mEq/L 21(L) 22 17(L)  Calcium 8.4 - 10.4 mg/dL 9.9 8.7(L) 9.1  Total Protein 6.4 - 8.3 g/dL 7.9 - 8.3(H)  Total Bilirubin 0.20 - 1.20 mg/dL 0.49 - 0.9  Alkaline Phos 40 - 150 U/L 167(H) - 142(H)  AST 5 - 34 U/L 29 - 40  ALT 0 - 55 U/L 40 - 35   EGFR mutation negative.  Component     Latest Ref Rng & Units 02/05/2016  Hepatitis B Surface Ag     Negative Negative  HCV Ab     0.0 - 0.9 s/co ratio <0.1  Hep A Ab, IgM     Negative Negative  Hep B Core Ab, IgM     Negative Negative  HIV  Non Reactive Non Reactive  CRP     <1.0 mg/dL 19.1 (H)  Sed Rate     0 - 16 mm/hr 140 (H)  RA Latex Turbid.     0.0 - 13.9 IU/mL 74.6 (H)       RADIOGRAPHIC STUDIES: I have personally reviewed the radiological images as listed and agreed with the findings in the report. Dg Chest 2 View  Result Date: 04/26/2016 CLINICAL DATA:  Increasing shortness of breath and cough over the last few days EXAM: CHEST  2 VIEW COMPARISON:  03/02/2016 FINDINGS: Persistent right lower lobe mass lesion is seen. Nodules are noted bilaterally consistent with metastatic disease. The left upper lobe nodule measures 2.5 cm increased in size from 9 mm on the prior exam. A 2.9 cm left basilar nodule is seen new from the prior study. No acute infiltrate or sizable effusion is noted. Mild interstitial edema is seen. IMPRESSION: Changes consistent with the known history of metastatic lung carcinoma. The nodules in the left lung have increased in size. New interstitial changes likely representing edema although the possibility of lymphangitic spread of carcinoma could not be totally excluded. Electronically Signed   By: Inez Catalina M.D.   On: 04/26/2016 12:03   Ct Angio Chest Pe W And/or Wo Contrast  Result Date: 04/26/2016 CLINICAL DATA:  56 year old male with stage IV lung cancer post radiation therapy completed 4 days ago. Chemotherapy to start  this week. Shortness breath. Right back pain. Subsequent encounter. EXAM: CT ANGIOGRAPHY CHEST WITH CONTRAST TECHNIQUE: Multidetector CT imaging of the chest was performed using the standard protocol during bolus administration of intravenous contrast. Multiplanar CT image reconstructions and MIPs were obtained to evaluate the vascular anatomy. CONTRAST:  100 cc Isovue 370. COMPARISON:  04/26/2016 chest x-ray. 02/05/2016 chest CT. 03/02/2016 PET-CT. FINDINGS: Cardiovascular:  No pulmonary embolus or aortic dissection. Mediastinum/Nodes: Prominent mediastinal and hilar adenopathy minimally more prominent than on prior exam. Slightly enlarged right axillary lymph nodes new from prior exam. Esophagus is compressed by subcarinal adenopathy. Lungs/Pleura: Large necrotic mass filling the majority of the right lower lobe spanning over 12.6 x 9 x 12.5 cm minimally changed from prior exam when this measured 13 x 10 x 12.8 cm. This mass surrounds and narrows pulmonary arteries veins and bronchi. Several new or enlarging metastatic lesions throughout all lungs (approximately 15 in number). Index lymph node left upper lobe now with maximal dimension of 21 mm versus prior 5 mm. New mass left lower lobe measures up to 2.4 cm. Interstitial spread of tumor suspected throughout the lungs most notable right upper lobe and left lower lobe. Upper Abdomen: No adrenal lesion or liver lesion noted. Musculoskeletal: Tumor abuts the pleura adjacent to ribs but without rib destruction. Review of the MIP images confirms the above findings. IMPRESSION: No pulmonary embolus detected. Primary large necrotic tumor right lower lobe minimally changed. Several new or enlarging metastatic lesions throughout all lungs (approximately 15 in number). Interstitial spread of tumor suspected throughout the lungs most notable right upper lobe and left lower lobe. Prominent mediastinal and hilar adenopathy minimally more prominent than on prior exam. Slightly  enlarged right axillary lymph nodes new from prior exam. Electronically Signed   By: Genia Del M.D.   On: 04/26/2016 14:38    ASSESSMENT & PLAN:    1) Stage IV ([id number]) Rt Lung large lung biopsy proven lung adenocarcinoma with  concern for pleural thickening and pleural involvement, MRI brain neg for brain mets. EGFR, ALK, ROS-1, BRAF neg  PDL1 status- 10% PET/CT results noted 2) Emphysema 3) Cigarette smoking (has quit for the last several weeks - currently using nicotine patch) 4) Neoplasm related pain 5) Anxiety 6) Shortness of breath due to progression of lung cancer. PLAN --Patient with worsening performance status PS 3 with increasing shortness of breath and Oxygen requirements and symptom burden. -After discussing goals of care extensively as noted above the patient and his wife chooses to pursue home hospice cares. -Will discontinue further palliative chemotherapy - given hospice referral  -Oxycodone 5 mg by mouth twice a day and 5-10 mg every 4 hours as needed for pain and shortness of breath . -Ativan when necessary for anxiety and nausea . -Dexamethasone 2 mg by mouth daily for appetite and back pain . -Further symptom management as per hospice agency .   All of the patients questions were answered with apparent satisfaction. The patient knows to call the clinic with any problems, questions or concerns.  I spent 30 minutes counseling the patient face to face including discussion regarding chemotherapy options. The total time spent in the appointment was 40 minutes and more than 50% was on counseling and direct patient cares.    Sullivan Lone MD [place] AAHIVMS [facility] Hematology/Oncology Physician [facility]  (Office):       [phone] (Work cell):  [phone] (Fax):           [phone]

## 2016-05-06 NOTE — Telephone Encounter (Signed)
I would not be the best choice for hospice attending. Unfortunately, they may have to look somewhere else.  Algis Greenhouse. Jerline Pain, [place] [facility] Resident PGY-3 05/06/2016 12:38 PM

## 2016-05-06 NOTE — Telephone Encounter (Signed)
Amy calling from [facility] calling to see if dr Jerline Pain would be an attending physician for this patient. She said dr would need to sign orders etc for pt. Please call her back. [phone]. Deseree Kennon Holter, CMA

## 2016-05-06 NOTE — Telephone Encounter (Signed)
Spoke with Amy and she states that Dr. Irene Limbo is not agreeing to this plan. Dr. Irene Limbo does not want to be the attending physician. Amy wanted me to check with you one more time, if not, then she will try something else.

## 2016-05-06 NOTE — Telephone Encounter (Signed)
Spoke with Dewaine Oats since Amy was away from her desk and relayed this message. Jazmin Hartsell,CMA

## 2016-05-09 ENCOUNTER — Ambulatory Visit: Payer: Self-pay | Admitting: Surgery

## 2016-05-09 ENCOUNTER — Telehealth: Payer: Self-pay | Admitting: *Deleted

## 2016-05-09 NOTE — Telephone Encounter (Signed)
Called patient to ask about altering fu on 06-14-16 to 05-31-16 @ 1:15 pm with the PA, lvm for a return call

## 2016-05-11 ENCOUNTER — Ambulatory Visit (INDEPENDENT_AMBULATORY_CARE_PROVIDER_SITE_OTHER): Payer: Medicaid Other | Admitting: Family Medicine

## 2016-05-11 ENCOUNTER — Encounter: Payer: Self-pay | Admitting: Family Medicine

## 2016-05-11 VITALS — BP 108/70 | HR 119 | Temp 97.9°F | Ht 66.0 in | Wt 136.0 lb

## 2016-05-11 DIAGNOSIS — C3411 Malignant neoplasm of upper lobe, right bronchus or lung: Secondary | ICD-10-CM

## 2016-05-11 NOTE — Progress Notes (Signed)
    Subjective:  [name] Chapman is a 56 y.o. male who presents to the [facility] today with a chief complaint of hospital follow up for respiratory failure.   HPI:  Hospital follow Up Patient with a history of metastatic adenocarcinoma of the lung and was admitted to [facility] 2 weeks ago for 2 days with hypoxic respiratory failure, largely thought to be related to progression of his lung cancer. He was previously receiving radiation therapy for this and planned to start chemo this month. While in the hospital, patient had decided on hospice care. He will be meeting with the hospice physician later this week. Patient was seen at the [facility] for his initial visit to establish care approximately 3 months ago (has not been seen here since).   Since being discharged, he has been doing ok. He is now on oxygen at all times, but overall feels "ok."  Patient and wife are currently stressed about finances. Wife thinks that they are managing ok.   ROS: Per HPI  PMH: Smoking history reviewed.   Objective:  Physical Exam: BP 108/70   Pulse (!) 119   Temp 97.9 F (36.6 C) (Oral)   Ht '5\' 6"'$  (1.676 m)   Wt 136 lb (61.7 kg)   SpO2 99%   BMI 21.95 kg/m   Gen: 56yo man in NAD, sitting in chair, [place] in place, resting comfortably CV: RRR with no murmurs appreciated Pulm: NWOB, [place] inplace, CTAB with no crackles, wheezes, or rhonchi GI: Normal bowel sounds present. Soft, Nontender, Nondistended. Skin: warm, dry Neuro: grossly normal, moves all extremities Psych: Normal affect and thought content  Assessment/Plan:  Hospital Follow up for Respiratory Failure Respiratory status stable during his office visit today. He and his wife have decided on hospice care. He is currently on oxycodone for pain and respiratory distress. He is also on decadron for his back pain and to stimulate his appetite. He will be meeting with the hospice physician later this week. I offered [facility] to the patient and his wife today, however  both deferred. Will follow up with patient in 4-6 weeks, or sooner as needed. No need for medication adjustment at this time.   Algis Greenhouse. Jerline Pain, [place] [facility] Resident PGY-3 05/11/2016 5:34 PM

## 2016-05-11 NOTE — Patient Instructions (Signed)
We wont make any medication changes today.  Come back to see me in 4 weeks, or sooner if you need anything else.  Take care,  Dr Jerline Pain

## 2016-05-16 ENCOUNTER — Telehealth: Payer: Self-pay | Admitting: *Deleted

## 2016-05-16 NOTE — Telephone Encounter (Signed)
CALLED PATIENT TO INFORM THAT FU APPT. HAS BEEN MOVED TO 05-31-16 @ 3 PM WITH ASHLYN BRUNING , UNABLE TO LEAVE MESSAGE, MAILED APPT. CARD ON 05-16-16

## 2016-05-20 ENCOUNTER — Encounter: Payer: Self-pay | Admitting: Surgery

## 2016-05-23 ENCOUNTER — Inpatient Hospital Stay: Admission: RE | Admit: 2016-05-23 | Payer: Self-pay | Source: Ambulatory Visit | Admitting: Radiation Oncology

## 2016-05-30 ENCOUNTER — Telehealth: Payer: Self-pay

## 2016-05-30 NOTE — Progress Notes (Addendum)
[name] Chapman 56 y.o. man with stage IV ([id number]) with adenocarcinoma of the right lower lung radiation completed 04-21-16 one month FU. Called yesterday and wanted to keep the 1500 one month follow up appointment even tho he has home hospice.  He wants to talk about what his options are since he is feeling stronger and physically better each day with weight gain.  Weight changes, if any:  Wt Readings from Last 3 Encounters:  05/31/16 149 lb 3.2 oz (67.7 kg)  05/11/16 136 lb (61.7 kg)  05/05/16 136 lb 3 oz (61.8 kg)   Respiratory complaints, if any: SOB using O2 4 L/M nasal cannular  continuous, dry coughing and wheezing Hemoptysis, if any: None  Skin to right chest and right back with mild tanning since completion. Swallowing Problems/Pain/Difficulty swallowing:None Smoking Tobacco/Marijuana/Snuff/ETOH use:Former smoker 30 years 1 P/D quit 01-21-16 no alcohol or drugs Appetite :Good eating three meals per day drinking three ensure and snacking Pain:None When is next chemo scheduled?:None Lab work from of chart:N/A Imaging:N/A BP 120/82   Pulse (!) 110   Temp 98.1 F (36.7 C) (Oral)   Resp 18   Ht '5\' 6"'$  (1.676 m)   Wt 149 lb 3.2 oz (67.7 kg)   SpO2 100%   BMI 24.08 kg/m

## 2016-05-30 NOTE — Telephone Encounter (Signed)
Pt's wife is wanting to make an appt with Dr Irene Limbo to discuss pt's situation again. The last visit when hospice was called in was the first time she had heard 3-6 months. Pt is doing fairly well at this time but wanting to s/w Dr Irene Limbo- either on the phone or in person.  Explained Dr Irene Limbo is out of office for the week so will not hear from him until next week.

## 2016-05-31 ENCOUNTER — Encounter: Payer: Self-pay | Admitting: *Deleted

## 2016-05-31 ENCOUNTER — Encounter: Payer: Self-pay | Admitting: Radiation Oncology

## 2016-05-31 ENCOUNTER — Ambulatory Visit
Admission: RE | Admit: 2016-05-31 | Discharge: 2016-05-31 | Disposition: A | Discharge status: Home / Self Care | Payer: Medicaid Other | Source: Ambulatory Visit | Attending: Radiation Oncology | Admitting: Radiation Oncology

## 2016-05-31 VITALS — BP 120/82 | HR 110 | Temp 98.1°F | Resp 18 | Ht 66.0 in | Wt 149.2 lb

## 2016-05-31 DIAGNOSIS — Z79899 Other long term (current) drug therapy: Secondary | ICD-10-CM | POA: Insufficient documentation

## 2016-05-31 DIAGNOSIS — R590 Localized enlarged lymph nodes: Secondary | ICD-10-CM | POA: Diagnosis not present

## 2016-05-31 DIAGNOSIS — C3491 Malignant neoplasm of unspecified part of right bronchus or lung: Secondary | ICD-10-CM

## 2016-05-31 DIAGNOSIS — C3431 Malignant neoplasm of lower lobe, right bronchus or lung: Secondary | ICD-10-CM | POA: Insufficient documentation

## 2016-05-31 DIAGNOSIS — Z923 Personal history of irradiation: Secondary | ICD-10-CM | POA: Diagnosis not present

## 2016-05-31 DIAGNOSIS — Z87891 Personal history of nicotine dependence: Secondary | ICD-10-CM | POA: Diagnosis not present

## 2016-05-31 NOTE — Progress Notes (Signed)
84 Wife called yesterday she wants to keep the 1500 appointment for her husband even tho he is under hospice care.  She state he is doing better and is asking for a second opinion and wants to come in to talk about his condition. I mentioned that they could explain the situation when they come in for the appointment. 0820 Ashlyn Burning, P.A. made aware of the conservation.

## 2016-05-31 NOTE — Telephone Encounter (Addendum)
Spoke with family member of patient. Patient is currently back and forth on whether or not he wants to continue with hospice due to his current health (feeling better). Patient family member instructed that when he makes a final decision, let us know and we will proceed from there. Family member verbalized understanding.

## 2016-05-31 NOTE — Progress Notes (Signed)
Radiation Oncology         [phone] ________________________________  Name: [name] Chapman MRN: [id number]  Carafate: 05/31/2016  DOB: 05/13/1960  Post Treatment Note  CC: Dimas Chyle, MD  Brunetta Genera, MD  Diagnosis:  Stage IV [phone]) adenocarcinoma of the right lower lung  Interval Since Last Radiation:  5 weeks   03/03/16 - 04/21/16:  The primary tumor and involved mediastinal adenopathy were treated to 66 Gy in 33 fractions of 2 Gy.  Narrative:  The patient returns today for routine follow-up. He did have dry desquamation towards the end of treatment as well as radiation esophagitis. The esophagitis was improved using Carafate and has since resolved.  He presented to the office 1 week after completing radiotherapy with complaints of chills, cold sweats, increased shortness of breath and a sore throat.  He was advised to seek evaluation in the emergency Department to rule out pulmonary embolism which he did.  A CT Chest angiogram was obtained at that time which did not show a PE but did demonstrate significant disease progression with multiple new lung lesions bilaterally.  He was discharged home from the hospital on oxygen and prednisone for possible radiation pneumonitis. Despite treatment, he continued with a decline in his health status with progressive weight loss and anorexia as well as increased shortness of breath despite treatment.  He had a follow-up visit with Dr. Irene Limbo on 05/05/2016 for further discussion regarding beginning palliative chemotherapy.  Due to his declining health and likely inability to tolerate chemotherapy, the decision at that time was to pursue hospice care.  He has been under hospice care since that time and feels that he has made significant improvement with weight gain and stamina. At this point, he and his wife would like to reconsider the possibility of palliative chemotherapy.         On review of systems, the patient states that he has recovered  well from the effects of radiotherapy. He reports improved appetite, weight gain, and no difficulty swallowing at this point. He feels like he is gaining strength daily and his fatigue is significantly improved. He denies fever, chills, chest pain, nausea, vomiting, diarrhea or constipation. He continues with shortness of breath with exertion but feels that this is stabilized at this point. He continues on 4 L of oxygen by nasal cannula as well as 2 mg of Decadron daily.  ALLERGIES:  has No Known Allergies.  Meds: Current Outpatient Prescriptions  Medication Sig Dispense Refill  . acetaminophen (TYLENOL) 325 MG tablet Take 2 tablets (650 mg total) by mouth every 6 (six) hours as needed. 60 tablet 2  . dexamethasone (DECADRON) 2 MG tablet Take 1 tablet (2 mg total) by mouth daily. 30 tablet 0  . feeding supplement, ENSURE ENLIVE, (ENSURE ENLIVE) LIQD Take 237 mLs by mouth 3 (three) times daily between meals. 60 Bottle 0  . LORazepam (ATIVAN) 0.5 MG tablet Take 1 tablet (0.5 mg total) by mouth every 8 (eight) hours. 30 tablet 0  . oxyCODONE (ROXICODONE) 5 MG/5ML solution '5mg'$  po twice daily and may take additional 5-'10mg'$  q6h as needed for pain or shortness of breath 473 mL 0  . predniSONE (DELTASONE) 20 MG tablet Take 3 tabs po daily for 5 days, then take 2 tabs po daily for 5 days, then 1 tab po daily x 5d, then stop (Patient not taking: Reported on 05/31/2016) 30 tablet 0  . senna-docusate (SENNA S) 8.6-50 MG tablet Take 2 tablets by mouth at  bedtime. (Patient not taking: Reported on 05/31/2016) 60 tablet 1   No current facility-administered medications for this encounter.     Physical Findings:  height is '5\' 6"'$  (1.676 m) and weight is 149 lb 3.2 oz (67.7 kg). His oral temperature is 98.1 F (36.7 C). His blood pressure is 120/82 and his pulse is 110 (abnormal). His respiration is 18 and oxygen saturation is 100%.  Pain Assessment Pain Score: 0-No pain (O2 4 L/M nasal cannular)/10 In general this  is a well appearing african [place] male in no acute distress. He's alert and oriented x4 and appropriate throughout the examination. Cardiopulmonary assessment is negative for acute distress and he exhibits normal effort. There are diminished breath sounds throughout without rales, rhonchi or wheezing.  Lab Findings: Lab Results  Component Value Date   WBC 5.7 05/05/2016   HGB 12.8 (L) 05/05/2016   HCT 39.0 05/05/2016   MCV 79.9 05/05/2016   PLT 199 05/05/2016     Radiographic Findings: No results found.  Impression/Plan: 1. Stage IV ([id number]) adenocarcinoma of the right lower lung. The patient appears to be doing well following radiotherapy. Unfortunately, his CT scan during recent hospitalization demonstrates disease progression.  He has recently transitioned over to Hospice care but he and his wife are both interested in reconsidering palliative chemotherapy now that he is feeling stronger. We will make arrangements for him to follow up with Dr. Irene Limbo for further discussion and will await his final decision before ordering further CT scans.  If he decides to pursue palliative chemotherapy, we are happy to continue to follow him with medical oncology and participate in his care as needed.   Nicholos Johns, PA-C

## 2016-06-01 ENCOUNTER — Other Ambulatory Visit: Payer: Self-pay

## 2016-06-01 ENCOUNTER — Ambulatory Visit (INDEPENDENT_AMBULATORY_CARE_PROVIDER_SITE_OTHER): Payer: Medicaid Other | Admitting: Surgery

## 2016-06-01 ENCOUNTER — Telehealth: Payer: Self-pay | Admitting: Family Medicine

## 2016-06-01 ENCOUNTER — Encounter: Payer: Self-pay | Admitting: Surgery

## 2016-06-01 VITALS — BP 114/80 | HR 116 | Temp 97.7°F | Resp 20 | Ht 66.0 in | Wt 150.0 lb

## 2016-06-01 DIAGNOSIS — I70212 Atherosclerosis of native arteries of extremities with intermittent claudication, left leg: Secondary | ICD-10-CM | POA: Diagnosis not present

## 2016-06-01 NOTE — Telephone Encounter (Signed)
Vascular and Vein Specialist: needs an aorta gram with runnoff and possible intervention.  This is scheduled for march 27.  Dr Trula Slade would like dr Ellwood Handler ok for this procedure.  Detail progress notes are in Epic. Phone  Number is the offices direct number. Please get operator to get Arbie Cookey or leave a message and she will return the call.

## 2016-06-01 NOTE — Progress Notes (Signed)
[facility] [place]  Patient name: [name] Chapman MRN: [id number] DOB: 1960/12/15 Sex: male   REASON FOR VISIT:    f/u left leg pain  HISOTRY OF PRESENT ILLNESS:   [name] Chapman is a 56 y.o. male who I met in November 2017 when he presented to the emergency department with worsening left leg pain over the past week.  He states that he has had tingling in his leg for approximately 6 months.  He has also suffered from difficulty with ambulation for 6 months and can walk approximately 100 feet.  He also had difficulty going up steps, developing pain after going up 3 steps.  He denied open wounds or rest pain.  While in the ER he underwent a CT scan which showed a large lung mass.  It also showed a left iliac occlusion.  Because of the lung mass, intervention on the left leg was deferred, given that his symptoms have been present for at least 6 months and he did not have any open wounds.  The patient has subsequently been diagnosed with adenocarcinoma of the lung. He has undergone radiation treatment.  His cancer has spread.  He was entered into hospice, but has made some improvement and therefore he is being considered for palliative additional treatments.  The patient states that his biggest complaint is that of leg pain.  He is having trouble walking now he has pain in his foot continuously.  He does not have any open wounds or ulcers.   PAST MEDICAL HISTORY:   Past Medical History:  Diagnosis Date  . 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 CAD Father   . Cancer Neg Hx     SOCIAL HISTORY:   Social History  Substance Use Topics  . Smoking status: Former Smoker    Packs/day: 1.00    Years: 30.00    Types: Cigarettes    Quit date: 01/21/2016  . Smokeless tobacco: Never Used  . Alcohol use No     Comment: reports he used  to drink approximately six beers per week but, stopped since diagnosis.     ALLERGIES:   No Known Allergies   CURRENT MEDICATIONS:   Current Outpatient Prescriptions  Medication Sig Dispense Refill  . acetaminophen (TYLENOL) 325 MG tablet Take 2 tablets (650 mg total) by mouth every 6 (six) hours as needed. 60 tablet 2  . dexamethasone (DECADRON) 2 MG tablet Take 1 tablet (2 mg total) by mouth daily. 30 tablet 0  . feeding supplement, ENSURE ENLIVE, (ENSURE ENLIVE) LIQD Take 237 mLs by mouth 3 (three) times daily between meals. 60 Bottle 0  . LORazepam (ATIVAN) 0.5 MG tablet Take 1 tablet (0.5 mg total) by mouth every 8 (eight) hours. 30 tablet 0  . oxyCODONE (ROXICODONE) 5 MG/5ML solution 59m po twice daily and may take additional 5-173mq6h as needed for pain or shortness of breath 473 mL 0  . predniSONE (DELTASONE) 20 MG tablet Take 3 tabs po daily for 5 days, then take 2 tabs po daily for 5 days, then 1 tab po daily x 5d, then stop (Patient not taking: Reported on 05/31/2016) 30 tablet 0  . senna-docusate (SENNA S) 8.6-50 MG tablet Take 2 tablets by mouth at bedtime. (Patient not taking: Reported on 05/31/2016) 60 tablet 1   No current facility-administered  medications for this visit.     REVIEW OF SYSTEMS:   _0  denotes positive finding, _1  denotes negative finding Cardiac  Comments:  Chest pain or chest pressure:    Shortness of breath upon exertion:    Short of breath when lying flat:    Irregular heart rhythm:        Vascular    Pain in calf, thigh, or hip brought on by ambulation: x   Pain in feet at night that wakes you up from your sleep:  x   Blood clot in your veins:    Leg swelling:         Pulmonary    Oxygen at home: x   Productive cough:     Wheezing:  x       Neurologic    Sudden weakness in arms or legs:     Sudden numbness in arms or legs:     Sudden onset of difficulty speaking or slurred speech:    Temporary loss of vision in one eye:     Problems  with dizziness:         Gastrointestinal    Blood in stool:     Vomited blood:         Genitourinary    Burning when urinating:     Blood in urine:        Psychiatric    Major depression:         Hematologic    Bleeding problems:    Problems with blood clotting too easily:        Skin    Rashes or ulcers:        Constitutional    Fever or chills:      PHYSICAL EXAM:   Vitals:   06/01/16 1034  BP: 114/80  Pulse: (!) 116  Resp: 20  Temp: 97.7 F (36.5 C)  SpO2: 96%  Weight: 150 lb (68 kg)  Height: _2  (1.676 m)    GENERAL: The patient is a well-nourished male, in no acute distress. The vital signs are documented above. CARDIAC: There is a regular rate and rhythm.  VASCULAR: Nonpalpable pedal pulses PULMONARY: Non-labored respirations ABDOMEN: Soft and non-tender with normal pitched bowel sounds.  MUSCULOSKELETAL: There are no major deformities or cyanosis. NEUROLOGIC: No focal weakness or paresthesias are detected. SKIN: There are no ulcers or rashes noted. PSYCHIATRIC: The patient has a normal affect.  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  This is significantly impacting his quality of life.  While I do not believe he is a candidate for surgical revascularization, he potentially could be treated percutaneously if I could recanalize his iliac artery.  I think this would make a significant improvement in his ability to walk and the pain free.  I discussed with the patient this will likely require cannulation of both femoral arteries.  There is the possibility that I cannot recanalize his artery and therefore this would not make him any better.  There is also the possibility of hematoma following sheath removal which at the first case did require going to the operating room.  I  think the likelihood of this is very low.  Given the significant disability the patient is having from his leg pain, think it is reasonable to proceed.  The patient and wife certainly agree.  I have scheduled this for Tuesday, March 27.    [name], MD [facility] [phone] Pager [phone]

## 2016-06-01 NOTE — Telephone Encounter (Signed)
Will forward to MD. Nechama Escutia,CMA  

## 2016-06-02 ENCOUNTER — Other Ambulatory Visit: Payer: Self-pay | Admitting: *Deleted

## 2016-06-02 ENCOUNTER — Telehealth: Payer: Self-pay | Admitting: *Deleted

## 2016-06-02 ENCOUNTER — Telehealth: Payer: Self-pay

## 2016-06-02 DIAGNOSIS — C3411 Malignant neoplasm of upper lobe, right bronchus or lung: Secondary | ICD-10-CM

## 2016-06-02 NOTE — Telephone Encounter (Signed)
Scheduled patient for office visit w/ labs for next week. Patient instructed to get X-ray before office visit. Patient verbalized understanding.

## 2016-06-02 NOTE — Telephone Encounter (Signed)
Please see if we can get patient in for pre-op evaluation appt.  He needs to be assessed.  Also we need to see if he can get clearance from his Oncologist/ Pulmonologist regarding the lung mass/ need for sedation, etc.  I'd recommend that both of these be done prior to procedure.

## 2016-06-02 NOTE — Telephone Encounter (Signed)
-----   Message from Brunetta Genera, MD sent at 06/02/2016  2:00 AM EDT ----- Patient was having rapidly worsening clinical status and lung imaging prior to enrolling in hospice. If he has significantly improved performance status I shall be happy to see him in clinic sometime next week with labs cbc, cmp LDH and CXR . Loren/Joud Pettinato could figure out where to place him. Thanks [place]  ----- Message ----- From: Valrie Hart, RN Sent: 05/31/2016   4:56 PM To: Brunetta Genera, MD  Hey doc Please see below.  He does not have an appt with you.  Let me know how I can help you. Thanks Hinton Dyer ----- Message ----- From: Freeman Caldron, PA-C Sent: 05/31/2016   4:43 PM To: Valrie Hart, RN  FYI- Mr. Methot and his wife are adamant that they would like to further discuss the potential for palliative chemotherapy given his recent progress at home.  I have not ordered any follow up scans at this time since he is on Hospice care currently. Pending his decision regarding further treatment vs continuing Hospice care, we are happy to continue to participate as needed.  Please let me know if I need to do anything further at this time. Was not sure if he needed a referral since he is already established with Dr. Irene Limbo. Thank you! -Ashlyn

## 2016-06-02 NOTE — Telephone Encounter (Signed)
Phone call to Radiation Oncology; spoke with Shona Simpson, PA.  Informed that the pt. Is scheduled for an Aortogram with Bilat. Runoff, poss. Intervention 06/07/16.  Informed that Dr. Marigene Ehlers nurse was also contacted, and pt. Will be scheduled with Dr. Jerline Pain for a pre-op appt.  Questioned from an Oncology perspective, if the pt. Is able to proceed with the Aortogram; poss. Intervention.  Per A. [place], [place], the pt. Has completed his radiation treatments for lung mass, and can proceed with Aortogram.

## 2016-06-02 NOTE — Telephone Encounter (Signed)
Arbie Cookey Pullins (nurse) from vein and vascular is aware that patient's appt with Dr. Jerline Pain is on 06-07-16 and that we will have Dr. Jerline Pain send his note to her once this has been completed so she knows where to go from here with the procedure.  She states that she spoke with patient's oncologist and conversation was documented in epic.  Will forward to Dr. Jerline Pain so he is aware. Jazmin Hartsell,CMA

## 2016-06-02 NOTE — Telephone Encounter (Signed)
Spoke with Arbie Cookey and she will contact the oncology office to see if patient is stable enough for this procedure and if they would approve clearance.  I called patient and LM him a message asking him to call back and schedule an appointment on either Monday or Tuesday of next week with PCP to discuss this upcoming vein procedure.  When patient or his wife call back please schedule an appointment for his for pre-op evaluation. Jazmin Hartsell,CMA

## 2016-06-06 ENCOUNTER — Ambulatory Visit (HOSPITAL_BASED_OUTPATIENT_CLINIC_OR_DEPARTMENT_OTHER): Payer: Medicaid Other | Admitting: Hematology

## 2016-06-06 ENCOUNTER — Other Ambulatory Visit (HOSPITAL_BASED_OUTPATIENT_CLINIC_OR_DEPARTMENT_OTHER): Payer: Medicaid Other

## 2016-06-06 VITALS — BP 101/71 | HR 125 | Temp 98.2°F | Resp 18 | Ht 66.0 in | Wt 147.8 lb

## 2016-06-06 DIAGNOSIS — G893 Neoplasm related pain (acute) (chronic): Secondary | ICD-10-CM

## 2016-06-06 DIAGNOSIS — C3411 Malignant neoplasm of upper lobe, right bronchus or lung: Secondary | ICD-10-CM | POA: Diagnosis not present

## 2016-06-06 DIAGNOSIS — F419 Anxiety disorder, unspecified: Secondary | ICD-10-CM | POA: Diagnosis not present

## 2016-06-06 DIAGNOSIS — C3491 Malignant neoplasm of unspecified part of right bronchus or lung: Secondary | ICD-10-CM

## 2016-06-06 DIAGNOSIS — I739 Peripheral vascular disease, unspecified: Secondary | ICD-10-CM | POA: Diagnosis not present

## 2016-06-06 LAB — CBC & DIFF AND RETIC
BASO%: 0.2 % (ref 0.0–2.0)
BASOS ABS: 0 10*3/uL (ref 0.0–0.1)
EOS ABS: 0.2 10*3/uL (ref 0.0–0.5)
EOS%: 1 % (ref 0.0–7.0)
HCT: 35.9 % — ABNORMAL LOW (ref 38.4–49.9)
HEMOGLOBIN: 11.6 g/dL — AB (ref 13.0–17.1)
IMMATURE RETIC FRACT: 12.4 % — AB (ref 3.00–10.60)
LYMPH%: 4.7 % — AB (ref 14.0–49.0)
MCH: 26.7 pg — ABNORMAL LOW (ref 27.2–33.4)
MCHC: 32.3 g/dL (ref 32.0–36.0)
MCV: 82.7 fL (ref 79.3–98.0)
MONO#: 1.8 10*3/uL — ABNORMAL HIGH (ref 0.1–0.9)
MONO%: 11.4 % (ref 0.0–14.0)
NEUT#: 12.7 10*3/uL — ABNORMAL HIGH (ref 1.5–6.5)
NEUT%: 82.7 % — ABNORMAL HIGH (ref 39.0–75.0)
PLATELETS: 305 10*3/uL (ref 140–400)
RBC: 4.34 10*6/uL (ref 4.20–5.82)
RDW: 17.8 % — ABNORMAL HIGH (ref 11.0–14.6)
Retic %: 1.62 % (ref 0.80–1.80)
Retic Ct Abs: 70.31 10*3/uL (ref 34.80–93.90)
WBC: 15.4 10*3/uL — ABNORMAL HIGH (ref 4.0–10.3)
lymph#: 0.7 10*3/uL — ABNORMAL LOW (ref 0.9–3.3)
nRBC: 0 % (ref 0–0)

## 2016-06-06 LAB — COMPREHENSIVE METABOLIC PANEL
ALT: 47 U/L (ref 0–55)
ANION GAP: 13 meq/L — AB (ref 3–11)
AST: 28 U/L (ref 5–34)
Albumin: 2.1 g/dL — ABNORMAL LOW (ref 3.5–5.0)
Alkaline Phosphatase: 549 U/L — ABNORMAL HIGH (ref 40–150)
BILIRUBIN TOTAL: 0.56 mg/dL (ref 0.20–1.20)
BUN: 15.2 mg/dL (ref 7.0–26.0)
CHLORIDE: 93 meq/L — AB (ref 98–109)
CO2: 24 meq/L (ref 22–29)
CREATININE: 0.7 mg/dL (ref 0.7–1.3)
Calcium: 10 mg/dL (ref 8.4–10.4)
EGFR: >90 mL/min/{1.73_m2} (ref >90)
GLUCOSE: 101 mg/dL (ref 70–140)
Potassium: 4.1 mEq/L (ref 3.5–5.1)
Sodium: 130 mEq/L — ABNORMAL LOW (ref 136–145)
TOTAL PROTEIN: 8.7 g/dL — AB (ref 6.4–8.3)

## 2016-06-06 LAB — LACTATE DEHYDROGENASE: LDH: 339 U/L — ABNORMAL HIGH (ref 125–245)

## 2016-06-06 LAB — TECHNOLOGIST REVIEW

## 2016-06-06 MED ORDER — FENTANYL 12 MCG/HR TD PT72: 12.0000 ug | MEDICATED_PATCH | TRANSDERMAL | 0 refills | Status: AC

## 2016-06-07 ENCOUNTER — Ambulatory Visit (HOSPITAL_COMMUNITY): Admission: RE | Admit: 2016-06-07 | Payer: Medicaid Other | Source: Ambulatory Visit | Admitting: Surgery

## 2016-06-07 ENCOUNTER — Ambulatory Visit: Payer: Medicaid Other | Admitting: Family Medicine

## 2016-06-07 ENCOUNTER — Encounter (HOSPITAL_COMMUNITY): Admission: RE | Payer: Self-pay | Source: Ambulatory Visit

## 2016-06-07 SURGERY — ABDOMINAL AORTOGRAM W/LOWER EXTREMITY
Anesthesia: LOCAL

## 2016-06-08 ENCOUNTER — Other Ambulatory Visit: Payer: Self-pay

## 2016-06-08 NOTE — Progress Notes (Signed)
[name] Chapman    HEMATOLOGY/ONCOLOGY CLINIC NOTE  Date of Service: .06/06/2016  Patient Care Team: [name], MD as PCP - General (Family Medicine)  CHIEF COMPLAINTS/PURPOSE OF CONSULTATION:   followup for lung adenocarcinoma  HISTORY OF PRESENTING ILLNESS:  plz see previous note for details   INTERVAL HISTORY   [name] is currently under hospice cares through [facility]. He is here to discuss few issues. He was recently seen by vascular surgery due to increased left lower extremity pain likely due to his peripheral arterial disease. He and his common-law wife are somewhat flustered with the recommendation of possible leg surgery that they report was discussed with them. They are also again wondering about the role of palliative chemotherapy. We had a detailed discussion regarding the idea and goals of hospice care's. He has gained some weight since his last visit at the end of February but his overall performance status is still an ECOG PS a 3. He is still significantly oxygen dependent tachycardic and has minimal functional reserve. Labs show worsening nutritional status with a drop in his albumin level from 3.2 to 2.5 to 2 and a part of his weight gain appears to be due to third space fluid. My opinion for him was to focus on symptom management and spend quality time with his family. We discussed starting a fentanyl patch which was prescribed to help control his shortness of breath, cough, chest pain and his leg pain. I personally recommended he not pursue Surgery unless he had a clear understanding of goals he was trying to achieve.   MEDICAL HISTORY:  Past Medical History:  Diagnosis Date  . 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 VIDEO BRONCHOSCOPY Bilateral 01/29/2016   Procedure: VIDEO BRONCHOSCOPY WITH FLUORO;  Surgeon: Collene Gobble,  MD;  Location: [place];  Service: Cardiopulmonary;  Laterality: Bilateral;    SOCIAL HISTORY: Social History   Social History  . Marital status: Single    Spouse name: N/A  . Number of children: N/A  . Years of education: N/A   Occupational History  . Not on file.   Social History Main Topics  . Smoking status: Former Smoker    Packs/day: 1.00    Years: 30.00    Types: Cigarettes    Quit date: 01/21/2016  . Smokeless tobacco: Never Used  . Alcohol use No     Comment: reports he used to drink approximately six beers per week but, stopped since diagnosis.  . Drug use: No  . Sexual activity: Not Currently   Other Topics Concern  . Not on file   Social History Narrative  . No narrative on file    FAMILY HISTORY: Family History  Problem Relation Age of Onset  . CAD Father   . Cancer Neg Hx     ALLERGIES:  has No Known Allergies.  MEDICATIONS:  Current Outpatient Prescriptions  Medication Sig Dispense Refill  . acetaminophen (TYLENOL) 325 MG tablet Take 2 tablets (650 mg total) by mouth every 6 (six) hours as needed. 60 tablet 2  . dexamethasone (DECADRON) 2 MG tablet Take 1 tablet (2 mg total) by mouth daily. 30 tablet 0  . feeding supplement, ENSURE ENLIVE, (ENSURE ENLIVE) LIQD Take 237 mLs by mouth 3 (three) times daily between meals. 60 Bottle 0  . fentaNYL (DURAGESIC - DOSED MCG/HR)  12 MCG/HR Place 1 patch (12.5 mcg total) onto the skin every 3 (three) days. 10 patch 0  . LORazepam (ATIVAN) 0.5 MG tablet Take 1 tablet (0.5 mg total) by mouth every 8 (eight) hours. 30 tablet 0  . oxyCODONE (ROXICODONE) 5 MG/5ML solution 75m po twice daily and may take additional 5-112mq6h as needed for pain or shortness of breath 473 mL 0  . predniSONE (DELTASONE) 20 MG tablet Take 3 tabs po daily for 5 days, then take 2 tabs po daily for 5 days, then 1 tab po daily x 5d, then stop (Patient not taking: Reported on 05/31/2016) 30 tablet 0  . senna-docusate (SENNA S) 8.6-50 MG  tablet Take 2 tablets by mouth at bedtime. (Patient not taking: Reported on 05/31/2016) 60 tablet 1   No current facility-administered medications for this visit.     REVIEW OF SYSTEMS:    10 Point review of Systems was done is negative except as noted above.  PHYSICAL EXAMINATION: ECOG PERFORMANCE STATUS: 2 - Symptomatic, <50% confined to bed  . Vitals:   06/06/16 1559  BP: 101/71  Pulse: (!) 125  Resp: 18  Temp: 98.2 F (36.8 C)   Filed Weights   06/06/16 1559  Weight: 147 lb 12.8 oz (67 kg)   .Body mass index is 23.86 kg/m.  GENERAL:alert, in no acute distress and comfortable SKIN: skin color, texture, turgor are normal, no rashes or significant lesions EYES: normal, conjunctiva are pink and non-injected, sclera clear OROPHARYNX:no exudate, no erythema and lips, buccal mucosa, and tongue normal  NECK: supple, no JVD, thyroid normal size, non-tender, without nodularity LYMPH:  no palpable lymphadenopathy in the cervical, axillary or inguinal LUNGS: Decreased air entry at right lung with a few scattered rhonchi HEART: regular rate & rhythm,  no murmurs and no lower extremity edema ABDOMEN: abdomen soft, non-tender, normoactive bowel sounds  Musculoskeletal: Left foot somewhat colder than the right foot PSYCH: alert & oriented x 3 with fluent speech NEURO: no focal motor/sensory deficits  LABORATORY DATA:  I have reviewed the data as listed  . CBC Latest Ref Rng & Units 06/06/2016 05/05/2016 04/26/2016  WBC 4.0 - 10.3 10e3/uL 15.4(H) 5.7 5.6  Hemoglobin 13.0 - 17.1 g/dL 11.6(L) 12.8(L) 12.8(L)  Hematocrit 38.4 - 49.9 % 35.9(L) 39.0 38.3(L)  Platelets 140 - 400 10e3/uL 305 199 277    . CMP Latest Ref Rng & Units 06/06/2016 05/05/2016 04/27/2016  Glucose 70 - 140 mg/dl 101 80 129(H)  BUN 7.0 - 26.0 mg/dL 15.2 12.9 14  Creatinine 0.7 - 1.3 mg/dL 0.7 0.6(L) 0.69  Sodium 136 - 145 mEq/L 130(L) 136 127(L)  Potassium 3.5 - 5.1 mEq/L 4.1 3.8 4.4  Chloride 101 - 111 mmol/L  - - 96(L)  CO2 22 - 29 mEq/L 24 21(L) 22  Calcium 8.4 - 10.4 mg/dL 10.0 9.9 8.7(L)  Total Protein 6.4 - 8.3 g/dL 8.7(H) 7.9 -  Total Bilirubin 0.20 - 1.20 mg/dL 0.56 0.49 -  Alkaline Phos 40 - 150 U/L 549(H) 167(H) -  AST 5 - 34 U/L 28 29 -  ALT 0 - 55 U/L 47 40 -   EGFR mutation negative.  Component     Latest Ref Rng & Units 02/05/2016  Hepatitis B Surface Ag     Negative Negative  HCV Ab     0.0 - 0.9 s/co ratio <0.1  Hep A Ab, IgM     Negative Negative  Hep B Core Ab, IgM     Negative  Negative  HIV     Non Reactive Non Reactive  CRP     <1.0 mg/dL 19.1 (H)  Sed Rate     0 - 16 mm/hr 140 (H)  RA Latex Turbid.     0.0 - 13.9 IU/mL 74.6 (H)       RADIOGRAPHIC STUDIES: I have personally reviewed the radiological images as listed and agreed with the findings in the report. No results found.  ASSESSMENT & PLAN:    1) Stage IV ([id number]) Rt Lung large lung biopsy proven lung adenocarcinoma with  concern for pleural thickening and pleural involvement, MRI brain neg for brain mets. EGFR, ALK, ROS-1, BRAF neg PDL1 status- 10% PET/CT results noted 2) Emphysema 3) Cigarette smoking (has quit for the last several weeks - currently using nicotine patch) 4) Neoplasm related pain 5) Anxiety 6) Shortness of breath due to progression of lung cancer Oxygen dependent with worsening performance status. 7) left lower extremity pain due to severe peripheral vascular disease PLAN --Patient still with poor performance status ECOG performance status 3 also independent on 2-3 L/m by nasal cannula. --His pain was somewhat uncontrolled especially with his left lower extremity pain due to severe peripheral vascular disease. This appears to wax and wane. --Added fentanyl patch at 12 g per hour which will need to be optimized to control his pain. Oxycodone 5 mg by mouth twice a day and 5-10 mg every 4 hours as needed for pain and shortness of breath . --After extensive discussion  patient and wife she is to continue hospice cares. They are having difficult personal dynamics dealing with the patient's life threatening illness. Spent significant time counseling them regarding the current status of affairs. -I also encouraged him to not pursue surgery unless he has a clear understanding of why he is doing it. --He does make a decision again to pursue palliative chemotherapy. --Continue hospice cares -Ativan when necessary for anxiety and nausea . -Dexamethasone 2 mg by mouth daily for appetite and back pain . -Further symptom management as per hospice agency .   All of the patients questions were answered with apparent satisfaction. The patient knows to call the clinic with any problems, questions or concerns.  I spent 30 minutes counseling the patient face to face including discussion regarding chemotherapy options. The total time spent in the appointment was 40 minutes and more than 50% was on counseling and direct patient cares.    Sullivan Lone MD Hannah AAHIVMS [facility] Hematology/Oncology Physician [facility]  (Office):       [phone] (Work cell):  [phone] (Fax):           [phone]

## 2016-06-14 ENCOUNTER — Ambulatory Visit: Payer: Self-pay | Admitting: Radiation Oncology

## 2016-06-17 ENCOUNTER — Encounter: Payer: Self-pay | Admitting: Family Medicine

## 2016-06-17 ENCOUNTER — Ambulatory Visit (INDEPENDENT_AMBULATORY_CARE_PROVIDER_SITE_OTHER): Payer: Medicaid Other | Admitting: Family Medicine

## 2016-06-17 DIAGNOSIS — I739 Peripheral vascular disease, unspecified: Secondary | ICD-10-CM

## 2016-06-17 NOTE — Patient Instructions (Addendum)
You are ok to have that procedure for your leg.   ASk about starting Cilostazol for your leg pain.   Take care,  Dr Jerline Pain

## 2016-06-17 NOTE — Progress Notes (Signed)
   Subjective:  [name] Chapman is a 56 y.o. male who presents to the [facility] today with a chief complaint of left leg pain.   HPI:  PVD/Left Leg Pain Patient with severe left leg pain for the past several months secondary to left iliac occulsion. He was seen by the vascular surgery team last month who planned an aortogram later this month. The vascular surgery team thought that intervention would significantly improve his pain.   He is still having significant pain in that leg that is worse with exertion, though is also present at rest. Pain does improve with rest.   ROS: Per HPI  PMH: Smoking history reviewed.   Objective:  Physical Exam: BP 90/68   Pulse (!) 104   Temp 97.4 F (36.3 C) (Oral)   Ht '5\' 6"'$  (1.676 m)   Wt 139 lb (63 kg)   SpO2 99%   BMI 22.44 kg/m   Gen: NAD, thin, emaciated.  CV: RRR with no murmurs appreciated Pulm: NWOB, CTAB with no crackles, wheezes, or rhonchi Skin: warm, dry Neuro: grossly normal, moves all extremities Psych: Normal affect and thought content  Assessment/Plan:  PVD (peripheral vascular disease) (HCC) Given that patient's main complaint is his left leg pain, it would be reasonable to undergo aortogram with possible recannulization for palliation. Discussed this with patient and his wife. They will be having the aortogram later this month. Consider addition of cilostazol if pain persists after procedure or if he is unable to have the procedure performed.   Algis Greenhouse. Jerline Pain, [place] Resident PGY-3 06/17/2016 4:37 PM

## 2016-06-17 NOTE — Assessment & Plan Note (Signed)
Given that patient's main complaint is his left leg pain, it would be reasonable to undergo aortogram with possible recannulization for palliation. Discussed this with patient and his wife. They will be having the aortogram later this month. Consider addition of cilostazol if pain persists after procedure or if he is unable to have the procedure performed.

## 2016-06-20 ENCOUNTER — Other Ambulatory Visit: Payer: Self-pay

## 2016-06-21 ENCOUNTER — Encounter (HOSPITAL_COMMUNITY)
Admission: RE | Disposition: A | Discharge status: Home / Self Care | Payer: Self-pay | Source: Ambulatory Visit | Attending: Surgery

## 2016-06-21 ENCOUNTER — Encounter (HOSPITAL_COMMUNITY): Payer: Self-pay | Admitting: Surgery

## 2016-06-21 ENCOUNTER — Ambulatory Visit (HOSPITAL_COMMUNITY)
Admission: RE | Admit: 2016-06-21 | Discharge: 2016-06-21 | Disposition: A | Discharge status: Home / Self Care | Payer: Medicaid Other | Source: Ambulatory Visit | Attending: Surgery | Admitting: Surgery

## 2016-06-21 DIAGNOSIS — I70222 Atherosclerosis of native arteries of extremities with rest pain, left leg: Secondary | ICD-10-CM

## 2016-06-21 DIAGNOSIS — C3431 Malignant neoplasm of lower lobe, right bronchus or lung: Secondary | ICD-10-CM | POA: Insufficient documentation

## 2016-06-21 DIAGNOSIS — Z7952 Long term (current) use of systemic steroids: Secondary | ICD-10-CM | POA: Insufficient documentation

## 2016-06-21 DIAGNOSIS — I739 Peripheral vascular disease, unspecified: Secondary | ICD-10-CM | POA: Diagnosis present

## 2016-06-21 DIAGNOSIS — Z87891 Personal history of nicotine dependence: Secondary | ICD-10-CM | POA: Insufficient documentation

## 2016-06-21 DIAGNOSIS — Z8249 Family history of ischemic heart disease and other diseases of the circulatory system: Secondary | ICD-10-CM | POA: Diagnosis not present

## 2016-06-21 HISTORY — PX: ABDOMINAL AORTOGRAM: CATH118222

## 2016-06-21 HISTORY — PX: LOWER EXTREMITY ANGIOGRAPHY: CATH118251

## 2016-06-21 HISTORY — PX: PERIPHERAL VASCULAR BALLOON ANGIOPLASTY: CATH118281

## 2016-06-21 LAB — POCT I-STAT, CHEM 8
BUN: 5 mg/dL — AB (ref 6–20)
CHLORIDE: 100 mmol/L — AB (ref 101–111)
Calcium, Ion: 1.08 mmol/L — ABNORMAL LOW (ref 1.15–1.40)
Creatinine, Ser: 0.4 mg/dL — ABNORMAL LOW (ref 0.61–1.24)
Glucose, Bld: 117 mg/dL — ABNORMAL HIGH (ref 65–99)
HEMATOCRIT: 32 % — AB (ref 39.0–52.0)
Hemoglobin: 10.9 g/dL — ABNORMAL LOW (ref 13.0–17.0)
POTASSIUM: 3.9 mmol/L (ref 3.5–5.1)
SODIUM: 133 mmol/L — AB (ref 135–145)
TCO2: 24 mmol/L (ref 0–100)

## 2016-06-21 LAB — POCT ACTIVATED CLOTTING TIME
ACTIVATED CLOTTING TIME: 175 s
Activated Clotting Time: 202 seconds

## 2016-06-21 SURGERY — ABDOMINAL AORTOGRAM
Anesthesia: LOCAL

## 2016-06-21 MED ORDER — MIDAZOLAM HCL 2 MG/2ML IJ SOLN
INTRAMUSCULAR | Status: AC
  Filled 2016-06-21: qty 2

## 2016-06-21 MED ORDER — OXYCODONE HCL 5 MG PO TABS
ORAL_TABLET | ORAL | Status: AC
  Filled 2016-06-21: qty 1

## 2016-06-21 MED ORDER — MIDAZOLAM HCL 2 MG/2ML IJ SOLN
INTRAMUSCULAR | Status: DC | PRN
  Administered 2016-06-21: 1 mg via INTRAVENOUS
  Administered 2016-06-21: 0.5 mg via INTRAVENOUS
  Administered 2016-06-21: 1 mg via INTRAVENOUS

## 2016-06-21 MED ORDER — HEPARIN (PORCINE) IN NACL 2-0.9 UNIT/ML-% IJ SOLN
INTRAMUSCULAR | Status: AC
  Filled 2016-06-21: qty 1000

## 2016-06-21 MED ORDER — MORPHINE SULFATE (PF) 10 MG/ML IV SOLN
2.0000 mg | INTRAVENOUS | Status: DC | PRN
  Administered 2016-06-21: 2 mg via INTRAVENOUS

## 2016-06-21 MED ORDER — ONDANSETRON HCL 4 MG/2ML IJ SOLN: 4.0000 mg | Freq: Four times a day (QID) | INTRAMUSCULAR | Status: DC | PRN

## 2016-06-21 MED ORDER — FENTANYL CITRATE (PF) 100 MCG/2ML IJ SOLN
INTRAMUSCULAR | Status: AC
  Filled 2016-06-21: qty 2

## 2016-06-21 MED ORDER — SODIUM CHLORIDE 0.9 % IV SOLN
INTRAVENOUS | Status: DC
  Administered 2016-06-21: 08:00:00 via INTRAVENOUS

## 2016-06-21 MED ORDER — IODIXANOL 320 MG/ML IV SOLN
INTRAVENOUS | Status: DC | PRN
  Administered 2016-06-21: 125 mL via INTRAVENOUS

## 2016-06-21 MED ORDER — HEPARIN SODIUM (PORCINE) 1000 UNIT/ML IJ SOLN
INTRAMUSCULAR | Status: DC | PRN
  Administered 2016-06-21: 1000 [IU] via INTRAVENOUS
  Administered 2016-06-21: 6000 [IU] via INTRAVENOUS

## 2016-06-21 MED ORDER — DOCUSATE SODIUM 100 MG PO CAPS: 100.0000 mg | ORAL_CAPSULE | Freq: Every day | ORAL | Status: DC

## 2016-06-21 MED ORDER — PHENOL 1.4 % MT LIQD
1.0000 | OROMUCOSAL | Status: DC | PRN
  Filled 2016-06-21: qty 177

## 2016-06-21 MED ORDER — OXYCODONE HCL 5 MG PO TABS: 5.0000 mg | ORAL_TABLET | ORAL | Status: DC | PRN

## 2016-06-21 MED ORDER — HEPARIN (PORCINE) IN NACL 2-0.9 UNIT/ML-% IJ SOLN
INTRAMUSCULAR | Status: DC | PRN
  Administered 2016-06-21: 1000 mL

## 2016-06-21 MED ORDER — LIDOCAINE HCL (PF) 1 % IJ SOLN
INTRAMUSCULAR | Status: AC
Start: 2016-06-21 — End: ?
  Filled 2016-06-21: qty 30

## 2016-06-21 MED ORDER — HYDRALAZINE HCL 20 MG/ML IJ SOLN: 5.0000 mg | INTRAMUSCULAR | Status: DC | PRN

## 2016-06-21 MED ORDER — GUAIFENESIN-DM 100-10 MG/5ML PO SYRP
15.0000 mL | ORAL_SOLUTION | ORAL | Status: DC | PRN
  Filled 2016-06-21: qty 15

## 2016-06-21 MED ORDER — HEPARIN SODIUM (PORCINE) 1000 UNIT/ML IJ SOLN
INTRAMUSCULAR | Status: AC
  Filled 2016-06-21: qty 1

## 2016-06-21 MED ORDER — ACETAMINOPHEN 325 MG RE SUPP
325.0000 mg | RECTAL | Status: DC | PRN
  Filled 2016-06-21: qty 2

## 2016-06-21 MED ORDER — METOPROLOL TARTRATE 5 MG/5ML IV SOLN: 2.0000 mg | INTRAVENOUS | Status: DC | PRN

## 2016-06-21 MED ORDER — LABETALOL HCL 5 MG/ML IV SOLN: 10.0000 mg | INTRAVENOUS | Status: DC | PRN

## 2016-06-21 MED ORDER — MORPHINE SULFATE (PF) 4 MG/ML IV SOLN
INTRAVENOUS | Status: AC
  Filled 2016-06-21: qty 1

## 2016-06-21 MED ORDER — SODIUM CHLORIDE 0.9 % IV SOLN: 1.0000 mL/kg/h | INTRAVENOUS | Status: DC

## 2016-06-21 MED ORDER — FENTANYL CITRATE (PF) 100 MCG/2ML IJ SOLN
INTRAMUSCULAR | Status: DC | PRN
  Administered 2016-06-21 (×3): 25 ug via INTRAVENOUS

## 2016-06-21 MED ORDER — LIDOCAINE HCL (PF) 1 % IJ SOLN
INTRAMUSCULAR | Status: DC | PRN
  Administered 2016-06-21: 10 mL
  Administered 2016-06-21: 12 mL

## 2016-06-21 MED ORDER — ALUM & MAG HYDROXIDE-SIMETH 200-200-20 MG/5ML PO SUSP
15.0000 mL | ORAL | Status: DC | PRN
  Filled 2016-06-21: qty 30

## 2016-06-21 MED ORDER — ACETAMINOPHEN 325 MG PO TABS: 325.0000 mg | ORAL_TABLET | ORAL | Status: DC | PRN

## 2016-06-21 SURGICAL SUPPLY — 17 items
CATH ANGIO 5F BER2 65CM (CATHETERS) ×1 IMPLANT
CATH OMNI FLUSH 5F 65CM (CATHETERS) ×1 IMPLANT
CATH QUICKCROSS SUPP .018X90CM (MICROCATHETER) ×1 IMPLANT
CATH QUICKCROSS SUPP .035X90CM (MICROCATHETER) ×1 IMPLANT
DEVICE TORQUE H2O (MISCELLANEOUS) ×1 IMPLANT
DRAPE ZERO GRAVITY STERILE (DRAPES) ×1 IMPLANT
GUIDEWIRE ANGLED .035X150CM (WIRE) ×2 IMPLANT
KIT MICROINTRODUCER STIFF 5F (SHEATH) ×1 IMPLANT
KIT PV (KITS) ×4 IMPLANT
SHEATH PINNACLE 5F 10CM (SHEATH) ×1 IMPLANT
SHEATH PINNACLE 6F 10CM (SHEATH) ×1 IMPLANT
SHIELD RADPAD SCOOP 12X17 (MISCELLANEOUS) ×1 IMPLANT
SYR MEDRAD MARK V 150ML (SYRINGE) ×4 IMPLANT
TRANSDUCER W/STOPCOCK (MISCELLANEOUS) ×4 IMPLANT
TRAY PV CATH (CUSTOM PROCEDURE TRAY) ×4 IMPLANT
WIRE BENTSON .035X145CM (WIRE) ×2 IMPLANT
WIRE ROSEN-J .035X260CM (WIRE) ×1 IMPLANT

## 2016-06-21 NOTE — Op Note (Signed)
    Patient name: [name] Chapman MRN: [id number] DOB: 1960-10-31 Sex: male  06/21/2016 Pre-operative Diagnosis: Leg rest pain Post-operative diagnosis:  Same Surgeon:  [name] Procedure Performed:  1.  Ultrasound-guided access, left femoral artery  2.  Ultrasound-guided access, right femoral artery  3.  Abdominal aortogram with bifemoral runoff  4.  Failed angioplasty, left common and external iliac artery  5.  Conscious sedation (83 minutes)   Indications:  The patient suffers from metastatic lung cancer.  He has a occlusion of his left iliac artery.  He is considered for angiography for palliation given his rest pain.  Procedure:  The patient was identified in the holding area and taken to room 8.  The patient was then placed supine on the table and prepped and draped in the usual sterile fashion.  A time out was called.  Ultrasound was used to evaluate the right common femoral artery.  It was patent .  A digital ultrasound image was acquired.  A micropuncture needle was used to access the right common femoral artery under ultrasound guidance.  An 018 wire was advanced without resistance and a micropuncture sheath was placed.  The 018 wire was removed and a benson wire was placed.  The micropuncture sheath was exchanged for a 5 french sheath.  An omniflush catheter was advanced over the wire to the level of L-1.  An abdominal angiogram was obtained.  Next the catheter was pulled down to the aortic bifurcation and oblique images were acquired.  Findings:   Aortogram:  No significant renal artery stenosis.  The infrarenal abdominal aorta is course.  The right common and external iliac arteries are patent throughout their course.  There is occlusion at the origin of the left common iliac artery with reconstitution of the left external iliac artery from hypogastric collaterals.  Bilateral common femoral arteries are patent.   Intervention:  After the above images were acquired the decision was  made to proceed with intervention.  I felt I needed access in the left groin.  The left femoral artery was evaluated with ultrasound and found to be patent.  It was cannulated under ultrasound guidance with a micropuncture needle after anesthetizing the skin with 1% lidocaine.  A micropuncture sheath was then inserted and then over a Benson wire a 6 French sheath was placed.  The patient was fully heparinized.  I made multiple attempts to try to reenter the aorta from the left side using a Glidewire and a quick cross catheter as well as a Berenstein 2 catheter.  With no success and after creating a large dissection plane, I attempted to get into the occlusion from the right side.  I was able to get the tip of the Omni flush catheter into the origin of the artery that was unable to advance the wire back across the occlusion.  After attempting this with a Glidewire, the back end of a Glidewire, as well as a 018 wire, I did not feel that I was going to be successful and therefore the procedure was terminated.  Catheters and wires were removed.  The patient taken the holding area for sheath pull once his coagulation profile corrects  Impression:  #1  occluded left common iliac artery at its origin with reconstitution of the left external iliac artery from hypogastric collaterals  #2  unsuccessful attempted recannulization of the left common iliac artery   V. [name], M.D. [facility] Office: [phone] Pager:  [phone]

## 2016-06-21 NOTE — Progress Notes (Addendum)
Bilateral sheath removal.  70f RFA, 2101m manual pressure.  Left FA 6FR 20 min manual pressure.  Bedrest begins at 12:35.  Vitals stable, see flowsheet.  Back pain, 8/10 during sheath pull.  Distal pulses present with doppler.    Patient given instructions and verbal understanding.

## 2016-06-21 NOTE — H&P (View-Only) (Signed)
 [facility]  Patient name: [name] Chapman MRN: [id number] DOB: 10/16/1960 Sex: male   REASON FOR VISIT:    f/u left leg pain  HISOTRY OF PRESENT ILLNESS:   [name] Chapman is a 55 y.o. male who I met in November 2017 when he presented to the emergency department with worsening left leg pain over the past week.  He states that he has had tingling in his leg for approximately 6 months.  He has also suffered from difficulty with ambulation for 6 months and can walk approximately 100 feet.  He also had difficulty going up steps, developing pain after going up 3 steps.  He denied open wounds or rest pain.  While in the ER he underwent a CT scan which showed a large lung mass.  It also showed a left iliac occlusion.  Because of the lung mass, intervention on the left leg was deferred, given that his symptoms have been present for at least 6 months and he did not have any open wounds.  The patient has subsequently been diagnosed with adenocarcinoma of the lung. He has undergone radiation treatment.  His cancer has spread.  He was entered into hospice, but has made some improvement and therefore he is being considered for palliative additional treatments.  The patient states that his biggest complaint is that of leg pain.  He is having trouble walking now he has pain in his foot continuously.  He does not have any open wounds or ulcers.   PAST MEDICAL HISTORY:   Past Medical History:  Diagnosis Date  . 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 CAD Father   . Cancer Neg Hx     SOCIAL HISTORY:   Social History  Substance Use Topics  . Smoking status: Former Smoker    Packs/day: 1.00    Years: 30.00    Types: Cigarettes    Quit date: 01/21/2016  . Smokeless tobacco: Never Used  . Alcohol use No     Comment: reports he used  to drink approximately six beers per week but, stopped since diagnosis.     ALLERGIES:   No Known Allergies   CURRENT MEDICATIONS:   Current Outpatient Prescriptions  Medication Sig Dispense Refill  . acetaminophen (TYLENOL) 325 MG tablet Take 2 tablets (650 mg total) by mouth every 6 (six) hours as needed. 60 tablet 2  . dexamethasone (DECADRON) 2 MG tablet Take 1 tablet (2 mg total) by mouth daily. 30 tablet 0  . feeding supplement, ENSURE ENLIVE, (ENSURE ENLIVE) LIQD Take 237 mLs by mouth 3 (three) times daily between meals. 60 Bottle 0  . LORazepam (ATIVAN) 0.5 MG tablet Take 1 tablet (0.5 mg total) by mouth every 8 (eight) hours. 30 tablet 0  . oxyCODONE (ROXICODONE) 5 MG/5ML solution 5mg po twice daily and may take additional 5-10mg q6h as needed for pain or shortness of breath 473 mL 0  . predniSONE (DELTASONE) 20 MG tablet Take 3 tabs po daily for 5 days, then take 2 tabs po daily for 5 days, then 1 tab po daily x 5d, then stop (Patient not taking: Reported on 05/31/2016) 30 tablet 0  . senna-docusate (SENNA S) 8.6-50 MG tablet Take 2 tablets by mouth at bedtime. (Patient not taking: Reported on 05/31/2016) 60 tablet 1   No current facility-administered   medications for this visit.     REVIEW OF SYSTEMS:   [X] denotes positive finding, [ ] denotes negative finding Cardiac  Comments:  Chest pain or chest pressure:    Shortness of breath upon exertion:    Short of breath when lying flat:    Irregular heart rhythm:        Vascular    Pain in calf, thigh, or hip brought on by ambulation: x   Pain in feet at night that wakes you up from your sleep:  x   Blood clot in your veins:    Leg swelling:         Pulmonary    Oxygen at home: x   Productive cough:     Wheezing:  x       Neurologic    Sudden weakness in arms or legs:     Sudden numbness in arms or legs:     Sudden onset of difficulty speaking or slurred speech:    Temporary loss of vision in one eye:     Problems  with dizziness:         Gastrointestinal    Blood in stool:     Vomited blood:         Genitourinary    Burning when urinating:     Blood in urine:        Psychiatric    Major depression:         Hematologic    Bleeding problems:    Problems with blood clotting too easily:        Skin    Rashes or ulcers:        Constitutional    Fever or chills:      PHYSICAL EXAM:   Vitals:   06/01/16 1034  BP: 114/80  Pulse: (!) 116  Resp: 20  Temp: 97.7 F (36.5 C)  SpO2: 96%  Weight: 150 lb (68 kg)  Height: 5' 6" (1.676 m)    GENERAL: The patient is a well-nourished male, in no acute distress. The vital signs are documented above. CARDIAC: There is a regular rate and rhythm.  VASCULAR: Nonpalpable pedal pulses PULMONARY: Non-labored respirations ABDOMEN: Soft and non-tender with normal pitched bowel sounds.  MUSCULOSKELETAL: There are no major deformities or cyanosis. NEUROLOGIC: No focal weakness or paresthesias are detected. SKIN: There are no ulcers or rashes noted. PSYCHIATRIC: The patient has a normal affect.  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  This is significantly impacting his quality of life.  While I do not believe he is a candidate for surgical revascularization, he potentially could be treated percutaneously if I could recanalize his iliac artery.  I think this would make a significant improvement in his ability to walk and the pain free.  I discussed with the patient this will likely require cannulation of both femoral arteries.  There is the possibility that I cannot recanalize his artery and therefore this would not make him any better.  There is also the possibility of hematoma following sheath removal which at the first case did require going to the operating room.  I  think the likelihood of this is very low.  Given the significant disability the patient is having from his leg pain, think it is reasonable to proceed.    The patient and wife certainly agree.  I have scheduled this for Tuesday, March 27.    Wells Eshaal [place]y, MD [facility] Tel [phone] Pager [phone] 

## 2016-06-21 NOTE — Discharge Instructions (Signed)
Femoral Site Care °Refer to this sheet in the next few weeks. These instructions provide you with information about caring for yourself after your procedure. Your health care provider may also give you more specific instructions. Your treatment has been planned according to current medical practices, but problems sometimes occur. Call your health care provider if you have any problems or questions after your procedure. °What can I expect after the procedure? °After your procedure, it is typical to have the following: °· Bruising at the site that usually fades within 1-2 weeks. °· Blood collecting in the tissue (hematoma) that may be painful to the touch. It should usually decrease in size and tenderness within 1-2 weeks. °Follow these instructions at home: °· Take medicines only as directed by your health care provider. °· You may shower 24-48 hours after the procedure or as directed by your health care provider. Remove the bandage (dressing) and gently wash the site with plain soap and water. Pat the area dry with a clean towel. Do not rub the site, because this may cause bleeding. °· Do not take baths, swim, or use a hot tub until your health care provider approves. °· Check your insertion site every day for redness, swelling, or drainage. °· Do not apply powder or lotion to the site. °· Limit use of stairs to twice a day for the first 2-3 days or as directed by your health care provider. °· Do not squat for the first 2-3 days or as directed by your health care provider. °· Do not lift over 10 lb (4.5 kg) for 5 days after your procedure or as directed by your health care provider. °· Ask your health care provider when it is okay to: °¨ Return to work or school. °¨ Resume usual physical activities or sports. °¨ Resume sexual activity. °· Do not drive home if you are discharged the same day as the procedure. Have someone else drive you. °· You may drive 24 hours after the procedure unless otherwise instructed by  your health care provider. °· Do not operate machinery or power tools for 24 hours after the procedure or as directed by your health care provider. °· If your procedure was done as an outpatient procedure, which means that you went home the same day as your procedure, a responsible adult should be with you for the first 24 hours after you arrive home. °· Keep all follow-up visits as directed by your health care provider. This is important. °Contact a health care provider if: °· You have a fever. °· You have chills. °· You have increased bleeding from the site. Hold pressure on the site. °Get help right away if: °· You have unusual pain at the site. °· You have redness, warmth, or swelling at the site. °· You have drainage (other than a small amount of blood on the dressing) from the site. °· The site is bleeding, and the bleeding does not stop after 30 minutes of holding steady pressure on the site. °· Your leg or foot becomes pale, cool, tingly, or numb. °This information is not intended to replace advice given to you by your health care provider. Make sure you discuss any questions you have with your health care provider. °Document Released: 11/01/2013 Document Revised: 08/06/2015 Document Reviewed: 09/17/2013 °Elsevier Interactive Patient Education © 2017 [employer] ° °

## 2016-06-21 NOTE — Interval H&P Note (Signed)
History and Physical Interval Note:  06/21/2016 8:54 AM  [name] Chapman  has presented today for surgery, with the diagnosis of pvd w/ left leg rest pain  The various methods of treatment have been discussed with the patient and family. After consideration of risks, benefits and other options for treatment, the patient has consented to  Procedure(s): Abdominal Aortogram w/Lower Extremity (Left) as a surgical intervention .  The patient's history has been reviewed, patient examined, no change in status, stable for surgery.  I have reviewed the patient's chart and labs.  Questions were answered to the patient's satisfaction.     [name]

## 2016-06-21 NOTE — Interval H&P Note (Signed)
History and Physical Interval Note:  06/21/2016 9:34 AM  [name]  has presented today for surgery, with the diagnosis of pvd w/ left leg rest pain  The various methods of treatment have been discussed with the patient and family. After consideration of risks, benefits and other options for treatment, the patient has consented to  Procedure(s): Abdominal Aortogram w/Lower Extremity (Left) as a surgical intervention .  The patient's history has been reviewed, patient examined, no change in status, stable for surgery.  I have reviewed the patient's chart and labs.  Questions were answered to the patient's satisfaction.     [name]

## 2016-06-22 MED FILL — Morphine Sulfate Inj 4 MG/ML: INTRAMUSCULAR | Qty: 1 | Status: AC

## 2016-07-05 ENCOUNTER — Telehealth: Payer: Self-pay

## 2016-07-05 NOTE — Telephone Encounter (Signed)
Have made several attempts to contact the pt. at the following numbers: [phone] and [phone] to reschedule the Angiogram/ Iliac Stent, as requested per Dr. Trula Slade.  Voice messages left for the pt. to return call to the office.

## 2016-07-07 NOTE — Telephone Encounter (Signed)
Received voice message from pt's fiance stating the pt. "is not doing well, and will not be rescheduling the angiogram at this time."

## 2016-08-12 DEATH — deceased

## 2017-08-16 IMAGING — CT CT ANGIO CHEST
2 of 6 series · 18 of 36 positions shown · IV contrast (ISOVUE 370)
Comparison: 04/26/2016 chest x-ray. 02/05/2016 chest CT. 03/02/2016
PET-CT.

CLINICAL DATA: 55-year-old male with stage IV lung cancer post
radiation therapy completed 4 days ago. Chemotherapy to start this
week. Shortness breath. Right back pain. Subsequent encounter.

EXAM:
CT ANGIOGRAPHY CHEST WITH CONTRAST
TECHNIQUE: Multidetector CT imaging of the chest was performed using the
standard protocol during bolus administration of intravenous
contrast. Multiplanar CT image reconstructions and MIPs were
obtained to evaluate the vascular anatomy.
CONTRAST:  100 cc Isovue 370.

[Series 6: thins for pacs · axial · 0.74mm/px · z∈[+2027,+2314]mm · 17 of 319 slices shown]
[im 16/319  lung]
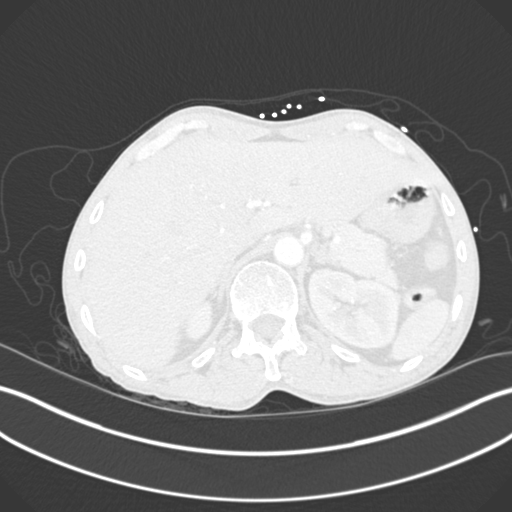
[im 32/319  mediastinal]
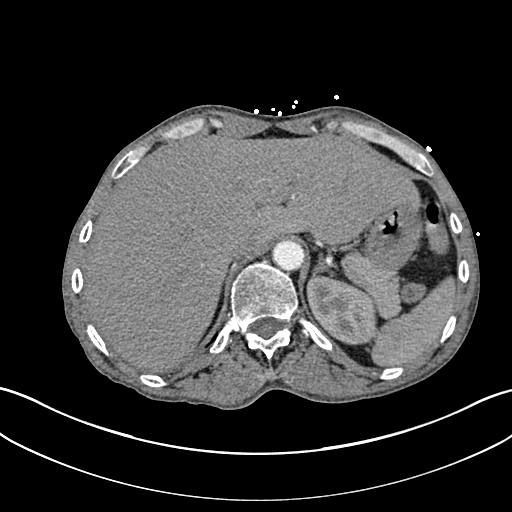
[im 48/319  lung]
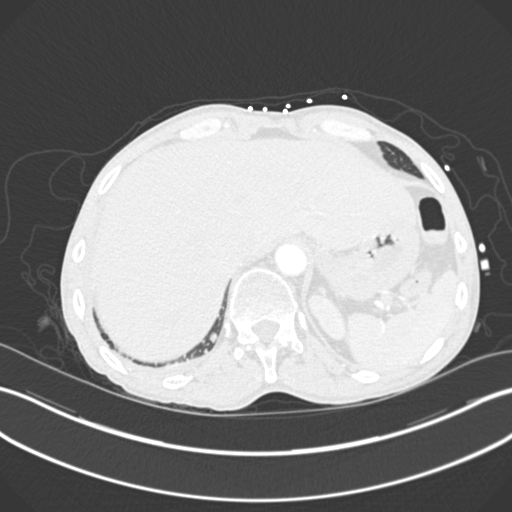
[im 64/319  mediastinal]
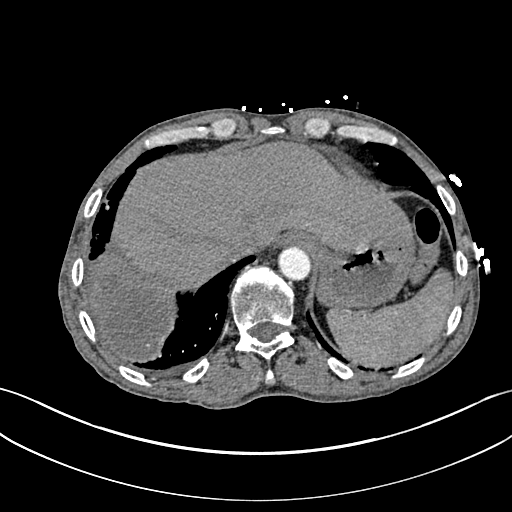
[im 96/319  lung]
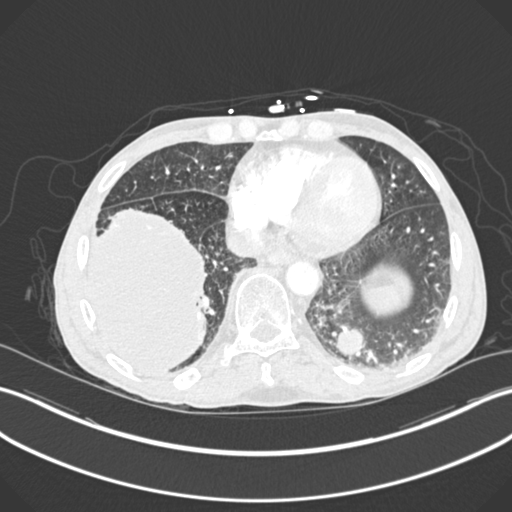
[im 112/319  mediastinal]
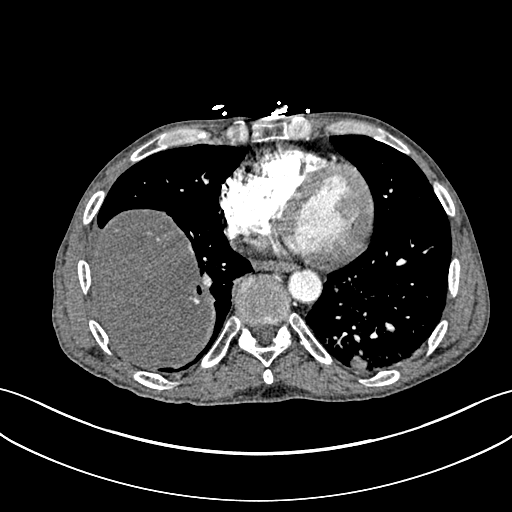
[im 128/319  lung]
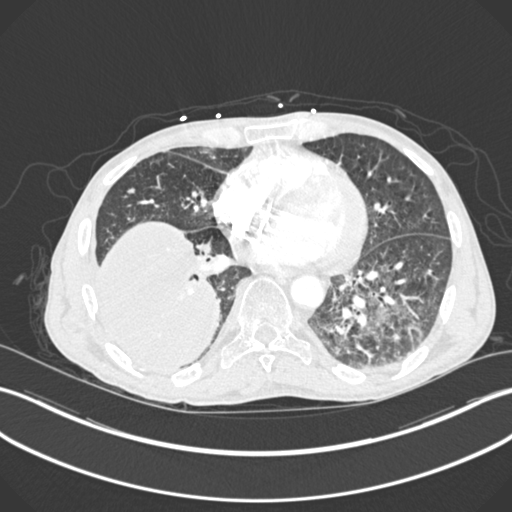
[im 144/319  mediastinal]
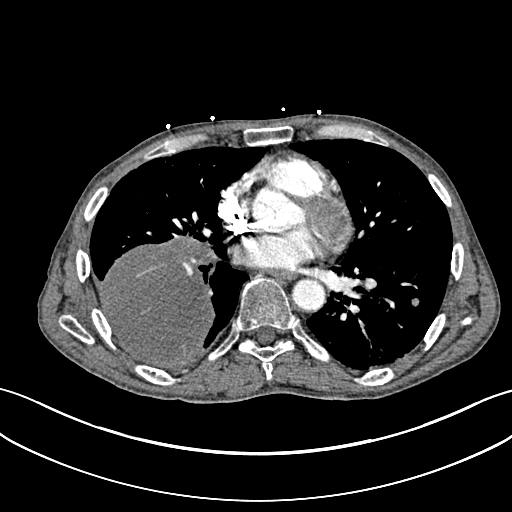
[im 160/319  lung]
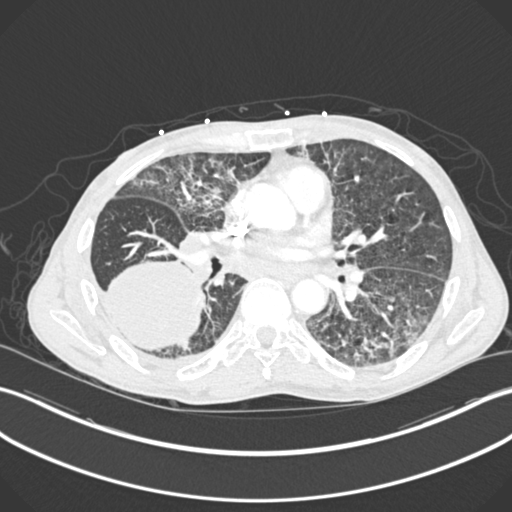
[im 175/319  mediastinal]
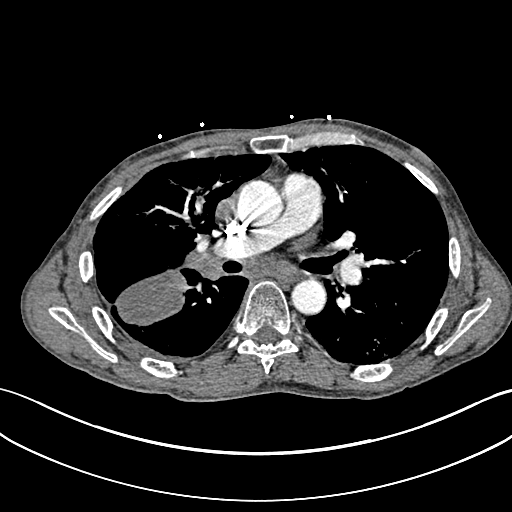
[im 191/319  lung]
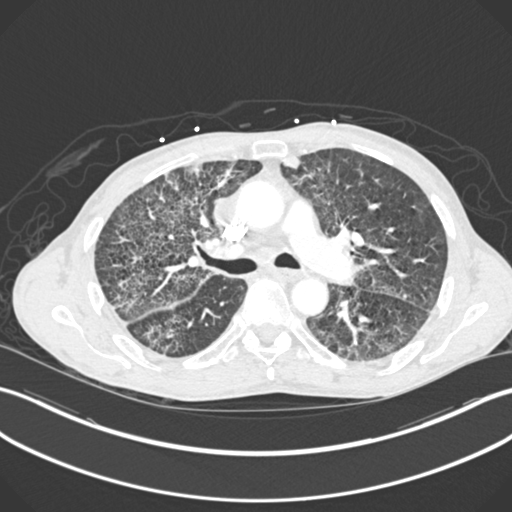
[im 207/319  mediastinal]
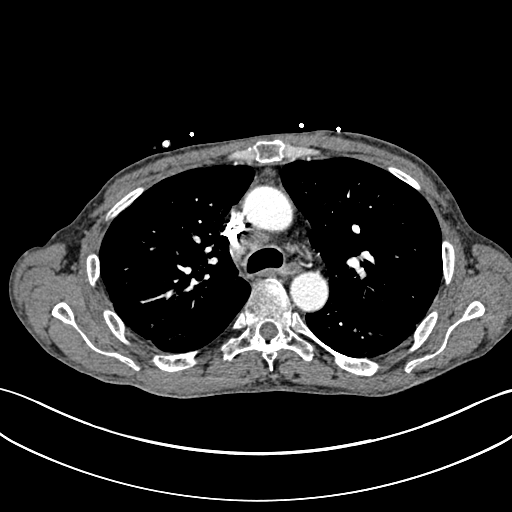
[im 223/319  lung]
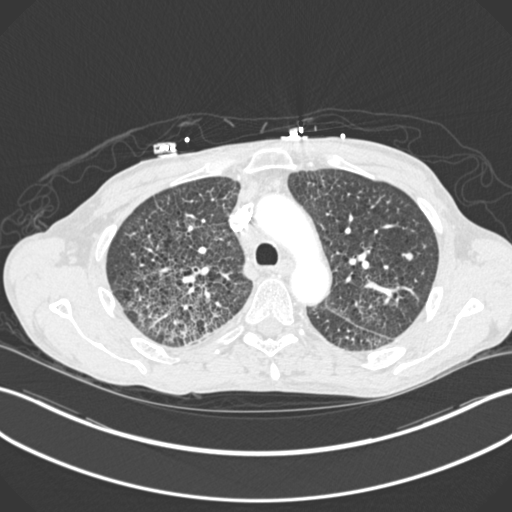
[im 255/319  mediastinal]
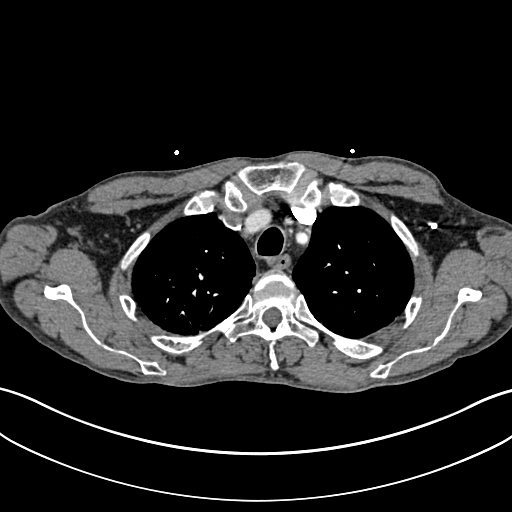
[im 271/319  lung]
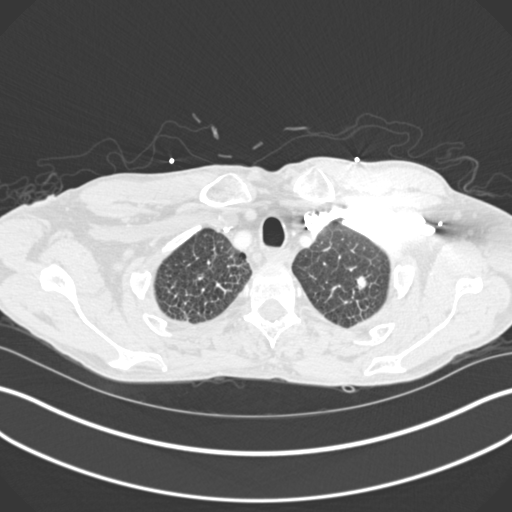
[im 287/319  mediastinal]
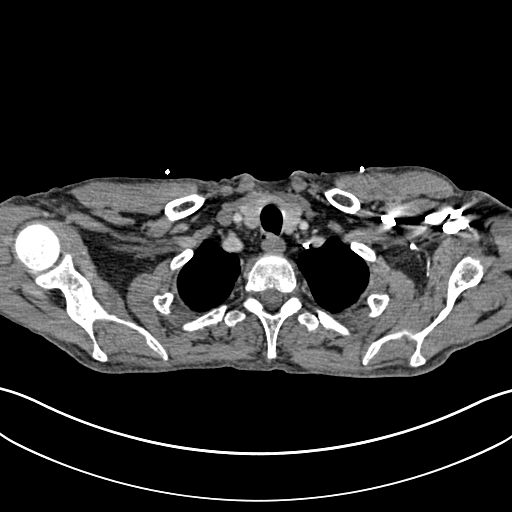
[im 303/319  lung]
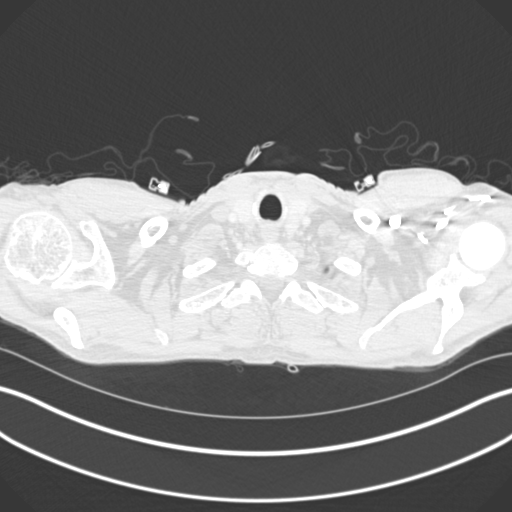

[Series 8: coronal mpr · coronal · 0.62mm/px · 1 of 126 slices shown]
[im 63/126  mediastinal]
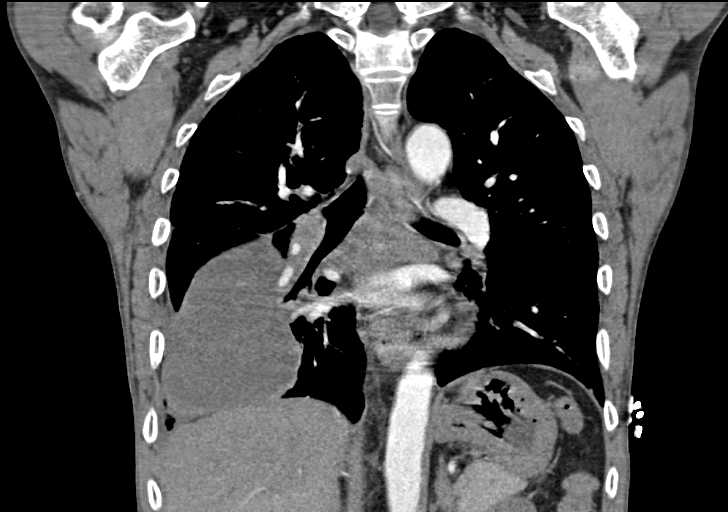

[18 of 36 positions shown; findings below may reference images not displayed]

FINDINGS: Cardiovascular:  No pulmonary embolus or aortic dissection.

Mediastinum/Nodes: Prominent mediastinal and hilar adenopathy
minimally more prominent than on prior exam. Slightly enlarged right
axillary lymph nodes new from prior exam.

Esophagus is compressed by subcarinal adenopathy.

Lungs/Pleura: Large necrotic mass filling the majority of the right
lower lobe spanning over 12.6 x 9 x 12.5 cm minimally changed from
prior exam when this measured 13 x 10 x 12.8 cm. This mass surrounds
and narrows pulmonary arteries veins and bronchi.

Several new or enlarging metastatic lesions throughout all lungs
(approximately 15 in number). Index lymph node left upper lobe now
with maximal dimension of 21 mm versus prior 5 mm. New mass left
lower lobe measures up to 2.4 cm.

Interstitial spread of tumor suspected throughout the lungs most
notable right upper lobe and left lower lobe.

Upper Abdomen: No adrenal lesion or liver lesion noted.

Musculoskeletal: Tumor abuts the pleura adjacent to ribs but without
rib destruction.

Review of the MIP images confirms the above findings.
IMPRESSION: No pulmonary embolus detected.

Primary large necrotic tumor right lower lobe minimally changed.

Several new or enlarging metastatic lesions throughout all lungs
(approximately 15 in number).

Interstitial spread of tumor suspected throughout the lungs most
notable right upper lobe and left lower lobe.

Prominent mediastinal and hilar adenopathy minimally more prominent
than on prior exam. Slightly enlarged right axillary lymph nodes new
from prior exam.
